# Patient Record
Sex: Female | Born: 1955
Health system: Southern US, Community
[De-identification: ages and names within clinical notes are randomized; demographics above are authoritative.]

## PROBLEM LIST (undated history)

## (undated) DIAGNOSIS — E785 Hyperlipidemia, unspecified: Secondary | ICD-10-CM

## (undated) DIAGNOSIS — G600 Hereditary motor and sensory neuropathy: Secondary | ICD-10-CM

## (undated) DIAGNOSIS — T7840XA Allergy, unspecified, initial encounter: Secondary | ICD-10-CM

## (undated) DIAGNOSIS — IMO0002 Reserved for concepts with insufficient information to code with codable children: Secondary | ICD-10-CM

## (undated) DIAGNOSIS — R51 Headache: Secondary | ICD-10-CM

## (undated) DIAGNOSIS — M1990 Unspecified osteoarthritis, unspecified site: Secondary | ICD-10-CM

## (undated) DIAGNOSIS — M75100 Unspecified rotator cuff tear or rupture of unspecified shoulder, not specified as traumatic: Secondary | ICD-10-CM

## (undated) DIAGNOSIS — M810 Age-related osteoporosis without current pathological fracture: Secondary | ICD-10-CM

## (undated) DIAGNOSIS — I499 Cardiac arrhythmia, unspecified: Secondary | ICD-10-CM

## (undated) DIAGNOSIS — R011 Cardiac murmur, unspecified: Secondary | ICD-10-CM

## (undated) DIAGNOSIS — F988 Other specified behavioral and emotional disorders with onset usually occurring in childhood and adolescence: Secondary | ICD-10-CM

## (undated) DIAGNOSIS — M797 Fibromyalgia: Secondary | ICD-10-CM

## (undated) DIAGNOSIS — E079 Disorder of thyroid, unspecified: Secondary | ICD-10-CM

## (undated) DIAGNOSIS — N390 Urinary tract infection, site not specified: Secondary | ICD-10-CM

## (undated) DIAGNOSIS — F329 Major depressive disorder, single episode, unspecified: Secondary | ICD-10-CM

## (undated) DIAGNOSIS — F32A Depression, unspecified: Secondary | ICD-10-CM

## (undated) HISTORY — DX: Major depressive disorder, single episode, unspecified: F32.9

## (undated) HISTORY — PX: JOINT REPLACEMENT: SHX530

## (undated) HISTORY — DX: Unspecified osteoarthritis, unspecified site: M19.90

## (undated) HISTORY — PX: WRIST SURGERY: SHX841

## (undated) HISTORY — DX: Disorder of thyroid, unspecified: E07.9

## (undated) HISTORY — DX: Allergy, unspecified, initial encounter: T78.40XA

## (undated) HISTORY — PX: FOOT SURGERY: SHX648

## (undated) HISTORY — DX: Headache: R51

## (undated) HISTORY — DX: Fibromyalgia: M79.7

## (undated) HISTORY — PX: HERNIA REPAIR: SHX51

## (undated) HISTORY — DX: Cardiac murmur, unspecified: R01.1

## (undated) HISTORY — DX: Reserved for concepts with insufficient information to code with codable children: IMO0002

## (undated) HISTORY — DX: Cardiac arrhythmia, unspecified: I49.9

## (undated) HISTORY — DX: Depression, unspecified: F32.A

## (undated) HISTORY — DX: Hereditary motor and sensory neuropathy: G60.0

## (undated) HISTORY — DX: Urinary tract infection, site not specified: N39.0

## (undated) HISTORY — DX: Other specified behavioral and emotional disorders with onset usually occurring in childhood and adolescence: F98.8

## (undated) HISTORY — DX: Hyperlipidemia, unspecified: E78.5

## (undated) HISTORY — PX: CATARACT EXTRACTION, BILATERAL: SHX1313

---

## 1980-10-04 HISTORY — PX: CARDIAC SURGERY: SHX584

## 2001-10-04 LAB — HM PAP SMEAR

## 2001-10-04 LAB — HM MAMMOGRAPHY

## 2005-10-04 HISTORY — PX: CARPAL TUNNEL RELEASE: SHX101

## 2011-11-23 ENCOUNTER — Ambulatory Visit: Payer: Self-pay | Admitting: Family Medicine

## 2011-11-23 ENCOUNTER — Telehealth: Payer: Self-pay | Admitting: *Deleted

## 2011-11-23 NOTE — Telephone Encounter (Signed)
No Show for new pt appt today, no number on chart information to call pt.

## 2012-01-31 ENCOUNTER — Encounter: Payer: Self-pay | Admitting: Family Medicine

## 2012-01-31 ENCOUNTER — Ambulatory Visit (INDEPENDENT_AMBULATORY_CARE_PROVIDER_SITE_OTHER): Payer: Federal, State, Local not specified - PPO | Admitting: Family Medicine

## 2012-01-31 VITALS — BP 140/78 | HR 72 | Temp 98.4°F | Resp 12 | Ht 65.0 in | Wt 174.0 lb

## 2012-01-31 DIAGNOSIS — Q211 Atrial septal defect, unspecified: Secondary | ICD-10-CM | POA: Insufficient documentation

## 2012-01-31 DIAGNOSIS — Q2111 Secundum atrial septal defect: Secondary | ICD-10-CM

## 2012-01-31 DIAGNOSIS — M1990 Unspecified osteoarthritis, unspecified site: Secondary | ICD-10-CM

## 2012-01-31 DIAGNOSIS — E039 Hypothyroidism, unspecified: Secondary | ICD-10-CM

## 2012-01-31 DIAGNOSIS — M810 Age-related osteoporosis without current pathological fracture: Secondary | ICD-10-CM | POA: Insufficient documentation

## 2012-01-31 DIAGNOSIS — F329 Major depressive disorder, single episode, unspecified: Secondary | ICD-10-CM

## 2012-01-31 DIAGNOSIS — F3289 Other specified depressive episodes: Secondary | ICD-10-CM

## 2012-01-31 DIAGNOSIS — IMO0001 Reserved for inherently not codable concepts without codable children: Secondary | ICD-10-CM

## 2012-01-31 DIAGNOSIS — J309 Allergic rhinitis, unspecified: Secondary | ICD-10-CM | POA: Insufficient documentation

## 2012-01-31 DIAGNOSIS — M797 Fibromyalgia: Secondary | ICD-10-CM

## 2012-01-31 DIAGNOSIS — G600 Hereditary motor and sensory neuropathy: Secondary | ICD-10-CM | POA: Insufficient documentation

## 2012-01-31 DIAGNOSIS — M899 Disorder of bone, unspecified: Secondary | ICD-10-CM

## 2012-01-31 DIAGNOSIS — F988 Other specified behavioral and emotional disorders with onset usually occurring in childhood and adolescence: Secondary | ICD-10-CM

## 2012-01-31 DIAGNOSIS — M8580 Other specified disorders of bone density and structure, unspecified site: Secondary | ICD-10-CM

## 2012-01-31 DIAGNOSIS — K219 Gastro-esophageal reflux disease without esophagitis: Secondary | ICD-10-CM | POA: Insufficient documentation

## 2012-01-31 DIAGNOSIS — F32A Depression, unspecified: Secondary | ICD-10-CM | POA: Insufficient documentation

## 2012-01-31 DIAGNOSIS — E785 Hyperlipidemia, unspecified: Secondary | ICD-10-CM

## 2012-01-31 MED ORDER — GABAPENTIN 800 MG PO TABS: 800.0000 mg | ORAL_TABLET | Freq: Four times a day (QID) | ORAL | Status: DC

## 2012-01-31 NOTE — Progress Notes (Signed)
Subjective:    Patient ID: Alexandra Norman, female    DOB: 03-May-1956, 56 y.o.   MRN: [id number]  HPI  New to establish care. Patient has a complicated past medical history. Her problems include osteoarthritis, depression, GERD, history of ASD repair, Charcot-Marie-Tooth disease, hyperlipidemia, hypothyroidism, attention deficit disorder, and osteopenia. Her multiple medications are reviewed. Compliant with all.  Maj. issue is chronic pain related to osteoarthritis and Charcot-Marie-Tooth disease. She takes tramadol and supplemental oxycodone. She is on high dose of gabapentin 800 mg 4 times a day and occasionally supplements with 400 mg dose which seems to work well. Previously tried statins for hyperlipidemia but this made her muscle pain much worse. Patient had ASD repair 1982. She's had a couple of orthopedic surgeries regarding foot problems related to Charcot-Marie-Tooth disease. Also a previous carpal tunnel surgery 2007.  History of osteopenia. Last DEXA about 6 years ago. Requesting repeat. Depression currently treated with Viibrid.  Still has depressive symptoms off and on. No suicidal ideation. GERD fairly well-controlled with Nexium. Still has occasional breakthrough symptoms. Hypothyroidism treated with levothyroxine 50 mcg daily. Last lab work estimated 6 months ago and reportedly normal.  Patient also relates questionable history of fibromyalgia. On multiple medications as above. Also requesting audiometry screen. Has difficulty sometimes hearing conversations but no problems with high-pitched noises. No history of recent hearing screen  Family history significant for both parents having osteoarthritis and alcohol history. Father had coronary disease. Mother and sister with history of depression.  The patient is divorced. Nonsmoker. No alcohol use. Works as a Engineer, water.  Past Medical History  Diagnosis Date  . Arthritis   . Depression   . Headache   . Ulcer   .  Allergy   . Arrhythmia   . Heart murmur   . Hyperlipidemia   . Thyroid disease   . UTI (urinary tract infection)    Past Surgical History  Procedure Date  . Cardiac surgery 1982  . Carpal tunnel release 2007    bilateral  . Foot surgery 2001 and 2005    bilateral    reports that she has never smoked. She does not have any smokeless tobacco history on file. Her alcohol and drug histories not on file. family history includes Alcohol abuse in her father and mother; Arthritis in her father and mother; Cancer in her sister; Heart disease in her father; and Mental illness in her mother and sister. Allergies  Allergen Reactions  . Penicillins     hives  . Statins       Review of Systems  Constitutional: Positive for fatigue. Negative for fever, chills, activity change, appetite change and unexpected weight change.  HENT: Negative for sore throat and trouble swallowing.   Eyes: Negative for visual disturbance.  Respiratory: Negative for cough, shortness of breath and wheezing.   Cardiovascular: Negative for chest pain, palpitations and leg swelling.  Gastrointestinal: Negative for abdominal pain.  Genitourinary: Negative for dysuria and hematuria.  Musculoskeletal: Positive for myalgias and arthralgias.  Skin: Negative for rash.  Neurological: Negative for dizziness, seizures, syncope, weakness and headaches.  Hematological: Negative for adenopathy.  Psychiatric/Behavioral: Positive for sleep disturbance and dysphoric mood. Negative for suicidal ideas.       Objective:   Physical Exam  Constitutional: She is oriented to person, place, and time. She appears well-developed and well-nourished. No distress.  HENT:  Right Ear: External ear normal.  Left Ear: External ear normal.  Mouth/Throat: Oropharynx is clear and moist.  Neck: Neck  supple. No thyromegaly present.  Cardiovascular: Normal rate and regular rhythm.  Exam reveals no gallop.   Pulmonary/Chest: Effort normal and  breath sounds normal. No respiratory distress. She has no wheezes. She has no rales.  Musculoskeletal: She exhibits no edema.       Patient has changes compatible with osteoarthritis with Bouchard's and Heberdens nodules on the hands.  She has interosseous muscle atrophy related to her Charcot-Marie-Tooth disease.  Lymphadenopathy:    She has no cervical adenopathy.  Neurological: She is alert and oriented to person, place, and time. No cranial nerve deficit.          Assessment & Plan:  #1 Charcot-Marie-Tooth disease. She has chronic pain related to this but overall stable. Ambulating without assistance without any difficulty. Refill gabapentin #2 GERD stable on Nexium #3 osteoarthritis involving hands. Continue tramadol and supplement with Percocet as needed. Pain medication contract signed. She does not need refills at this time #4 history of ASD repair #5 hyperlipidemia previously intolerant of statins #6 hypothyroidism. Recheck TSH at followup #7 osteopenia. No DEXA scan in 6 years. Schedule repeat #8 subjective decrease in hearing. Audiometry done. Patient has slight loss in the lower frequency range but better with higher frequency. No evidence for severe loss at this time

## 2012-02-03 ENCOUNTER — Encounter: Payer: Self-pay | Admitting: Family Medicine

## 2012-02-07 ENCOUNTER — Other Ambulatory Visit: Payer: Self-pay | Admitting: Family Medicine

## 2012-03-13 ENCOUNTER — Other Ambulatory Visit: Payer: Self-pay | Admitting: Family Medicine

## 2012-03-24 ENCOUNTER — Telehealth: Payer: Self-pay | Admitting: Family Medicine

## 2012-03-24 NOTE — Telephone Encounter (Signed)
Pt called req new scripts for amphetamine-dextroamphetamine (ADDERALL) 15 MG and oxyCODONE-acetaminophen (PERCOCET) 5-325 MG per tablet

## 2012-03-24 NOTE — Telephone Encounter (Signed)
Pt was new to establish on 01-30-12.  We have not filled the Adderall or Percocet yet.  Pt is scheduled for 2 month follow-up on 7/3.

## 2012-03-27 MED ORDER — OXYCODONE-ACETAMINOPHEN 5-325 MG PO TABS: ORAL_TABLET | ORAL | Status: DC

## 2012-03-27 MED ORDER — AMPHETAMINE-DEXTROAMPHETAMINE 15 MG PO TABS: 15.0000 mg | ORAL_TABLET | Freq: Two times a day (BID) | ORAL | Status: DC

## 2012-03-27 NOTE — Telephone Encounter (Signed)
Refill each once until follow up.

## 2012-03-27 NOTE — Telephone Encounter (Signed)
Pt informed on VM Rx ready to pick-up 

## 2012-04-05 ENCOUNTER — Ambulatory Visit (INDEPENDENT_AMBULATORY_CARE_PROVIDER_SITE_OTHER)
Admission: RE | Admit: 2012-04-05 | Discharge: 2012-04-05 | Disposition: A | Discharge status: Home / Self Care | Payer: Federal, State, Local not specified - PPO | Source: Ambulatory Visit | Attending: Family Medicine | Admitting: Family Medicine

## 2012-04-05 ENCOUNTER — Ambulatory Visit (INDEPENDENT_AMBULATORY_CARE_PROVIDER_SITE_OTHER): Payer: Federal, State, Local not specified - PPO | Admitting: Family Medicine

## 2012-04-05 ENCOUNTER — Encounter: Payer: Self-pay | Admitting: Family Medicine

## 2012-04-05 VITALS — BP 138/76 | Temp 98.0°F | Wt 173.0 lb

## 2012-04-05 DIAGNOSIS — E039 Hypothyroidism, unspecified: Secondary | ICD-10-CM

## 2012-04-05 DIAGNOSIS — F988 Other specified behavioral and emotional disorders with onset usually occurring in childhood and adolescence: Secondary | ICD-10-CM

## 2012-04-05 DIAGNOSIS — M899 Disorder of bone, unspecified: Secondary | ICD-10-CM

## 2012-04-05 DIAGNOSIS — Z79899 Other long term (current) drug therapy: Secondary | ICD-10-CM

## 2012-04-05 DIAGNOSIS — G600 Hereditary motor and sensory neuropathy: Secondary | ICD-10-CM

## 2012-04-05 DIAGNOSIS — M8580 Other specified disorders of bone density and structure, unspecified site: Secondary | ICD-10-CM

## 2012-04-05 LAB — HEPATIC FUNCTION PANEL
ALT: 18 U/L (ref 0–35)
AST: 21 U/L (ref 0–37)
Albumin: 3.8 g/dL (ref 3.5–5.2)
Alkaline Phosphatase: 90 U/L (ref 39–117)
Bilirubin, Direct: 0 mg/dL (ref 0.0–0.3)
Total Bilirubin: 0.2 mg/dL — ABNORMAL LOW (ref 0.3–1.2)
Total Protein: 6.2 g/dL (ref 6.0–8.3)

## 2012-04-05 LAB — TSH: TSH: 2.73 u[IU]/mL (ref 0.35–5.50)

## 2012-04-05 MED ORDER — BACLOFEN 10 MG PO TABS: 10.0000 mg | ORAL_TABLET | Freq: Three times a day (TID) | ORAL | Status: DC

## 2012-04-05 NOTE — Progress Notes (Signed)
  Subjective:    Patient ID: Alexandra Norman, female    DOB: Jul 26, 1956, 56 y.o.   MRN: [id number]  HPI  Medical followup. Patient history osteopenia, ADD, allergic rhinitis, history of atrial septal defect, Charcot-Marie-Tooth disease, depression, fibromyalgia, GERD, and hypothyroidism. She had recent DEXA scan and we are waiting on results.  Elevated blood pressures around 140/90 by recent readings. No home blood pressure cuff. No headaches. No dizziness. Never treated for hypertension. Currently not exercising. Weight stable.  History of depression. Recently ran out of her Viibrid and she's actually felt much better the past week. She feels her thinking is clearer. She has had some slight increase in neuropathic pain in her lower extremities. She would like to try to remain off.  Hypothyroidism. Last TSH almost one year ago. Takes levothyroxin. Requesting lab work. Requesting refills baclofen for her muscle spasms. Chronic pain is fairly well-controlled with oxycodone.  ADD history. Difficulties in prior authorization and we have not seen any forms yet.  Past Medical History  Diagnosis Date  . Arthritis   . Depression   . Headache   . Ulcer   . Allergy   . Arrhythmia   . Heart murmur   . Hyperlipidemia   . Thyroid disease   . UTI (urinary tract infection)    Past Surgical History  Procedure Date  . Cardiac surgery 1982    ASD repair   . Carpal tunnel release 2007    bilateral  . Foot surgery 2001 and 2005    bilateral    reports that she has never smoked. She does not have any smokeless tobacco history on file. Her alcohol and drug histories not on file. family history includes Alcohol abuse in her father and mother; Arthritis in her father and mother; Cancer in her sister; Heart disease in her father; and Mental illness in her mother and sister. Allergies  Allergen Reactions  . Penicillins     hives  . Statins       Review of Systems  Constitutional: Positive for  fatigue. Negative for fever and chills.  Respiratory: Negative for cough and shortness of breath.   Cardiovascular: Negative for chest pain.  Gastrointestinal: Negative for abdominal pain.  Neurological: Negative for dizziness and headaches.  Hematological: Negative for adenopathy.       Objective:   Physical Exam  Constitutional: She is oriented to person, place, and time. She appears well-developed and well-nourished. No distress.  HENT:  Right Ear: External ear normal.  Left Ear: External ear normal.  Mouth/Throat: Oropharynx is clear and moist.  Neck: Neck supple. No thyromegaly present.  Cardiovascular: Normal rate and regular rhythm.   Pulmonary/Chest: Effort normal and breath sounds normal. No respiratory distress. She has no wheezes. She has no rales.  Musculoskeletal: She exhibits no edema.  Neurological: She is alert and oriented to person, place, and time.          Assessment & Plan:  #1 elevated blood pressure. Recommend exercise and weight control and close monitoring. Consider medical therapy if not improving over the next few months #2 history of depression. Currently stable off antidepressants. Touch base immediately if she has increased depressive symptoms #3 hypothyroidism. Recheck TSH  #4 history of chronic muscle spasms. Refill baclofen . #5 history of ADD. Check on prior authorization for Adderall which she has taken for several years

## 2012-04-05 NOTE — Patient Instructions (Signed)

## 2012-04-06 ENCOUNTER — Encounter: Payer: Self-pay | Admitting: Family Medicine

## 2012-04-07 NOTE — Progress Notes (Signed)
Quick Note:  Pt informed on VM ______ 

## 2012-05-02 ENCOUNTER — Telehealth: Payer: Self-pay | Admitting: Family Medicine

## 2012-05-02 NOTE — Telephone Encounter (Signed)
Pt called req refills for the oxyCODONE-acetaminophen (PERCOCET) 5-325 MG per tablet and amphetamine-dextroamphetamine (ADDERALL) 15 MG tablet

## 2012-05-03 ENCOUNTER — Other Ambulatory Visit: Payer: Self-pay | Admitting: Family Medicine

## 2012-05-03 MED ORDER — AMPHETAMINE-DEXTROAMPHETAMINE 15 MG PO TABS: 15.0000 mg | ORAL_TABLET | Freq: Two times a day (BID) | ORAL | Status: DC

## 2012-05-03 MED ORDER — OXYCODONE-ACETAMINOPHEN 5-325 MG PO TABS: ORAL_TABLET | ORAL | Status: DC

## 2012-05-03 NOTE — Telephone Encounter (Signed)
Pt informed on VM Rx ready to pick-up 

## 2012-05-03 NOTE — Telephone Encounter (Signed)
Refills OK. 

## 2012-05-03 NOTE — Telephone Encounter (Signed)
adderall #60 with 0 refills on 03-27-12 Percocet #45 with 0 refills on 03-27-12  Pt had OV 04/06/12

## 2012-05-31 ENCOUNTER — Telehealth: Payer: Self-pay | Admitting: Family Medicine

## 2012-05-31 NOTE — Telephone Encounter (Signed)
Pt requesting refills on amphetamine-dextroamphetamine (ADDERALL) 15 MG tablet and oxyCODONE-acetaminophen (PERCOCET/ROXICET) 5-325 MG  Pt also requesting results of bone density scan

## 2012-05-31 NOTE — Telephone Encounter (Signed)
Adderall #60 (BID) and Oxycodone #45 (1 and 1/2 prn)last filled 05/03/12

## 2012-06-01 MED ORDER — OXYCODONE-ACETAMINOPHEN 5-325 MG PO TABS: ORAL_TABLET | ORAL | Status: DC

## 2012-06-01 MED ORDER — ALENDRONATE SODIUM 70 MG PO TABS: 70.0000 mg | ORAL_TABLET | ORAL | Status: DC

## 2012-06-01 MED ORDER — AMPHETAMINE-DEXTROAMPHETAMINE 15 MG PO TABS: 15.0000 mg | ORAL_TABLET | Freq: Two times a day (BID) | ORAL | Status: DC

## 2012-06-01 MED ORDER — AMPHETAMINE-DEXTROAMPHETAMINE 15 MG PO TABS: 15.0000 mg | ORAL_TABLET | Freq: Every day | ORAL | Status: DC

## 2012-06-01 NOTE — Telephone Encounter (Signed)
May refill requested medications. Let's provide 3 month refills for Adderall. Her DEXA scan revealed osteoporosis lumbar and hip. Patient does have history of reflux. If no prior intolerance to bisphosphonates, start Fosamax 70 mg weekly-refill for one year. Needs to take this upright position with full glass of water on empty stomach. Stop if she has any increasing reflux symptoms

## 2012-06-01 NOTE — Telephone Encounter (Signed)
Pt informed on VM of meds will be ready to pick-up.  Requested she call back so I can explain Dexa and new med

## 2012-06-01 NOTE — Telephone Encounter (Signed)
Fosamax has been sent to pt pharmacy, controlled meds ready to pick-up and a note included that dexa showed osteoprosis and she needs to begin taking Fosamax

## 2012-06-27 ENCOUNTER — Other Ambulatory Visit: Payer: Self-pay | Admitting: *Deleted

## 2012-06-27 MED ORDER — TEMAZEPAM 15 MG PO CAPS: 15.0000 mg | ORAL_CAPSULE | Freq: Every evening | ORAL | Status: DC | PRN

## 2012-06-27 NOTE — Telephone Encounter (Signed)
Called in.

## 2012-06-27 NOTE — Telephone Encounter (Signed)
Faxed refill request Temazepam 15 mg, one at [facility] PR[facility].  This med is only historical in pt chart at this time.

## 2012-06-27 NOTE — Telephone Encounter (Signed)
Refill OK for 3 months. 

## 2012-07-05 ENCOUNTER — Other Ambulatory Visit: Payer: Self-pay | Admitting: Family Medicine

## 2012-07-05 MED ORDER — AMPHETAMINE-DEXTROAMPHETAMINE 15 MG PO TABS: 15.0000 mg | ORAL_TABLET | Freq: Two times a day (BID) | ORAL | Status: DC

## 2012-07-05 NOTE — Telephone Encounter (Addendum)
Pt needs new rx percocet. Pt stated last 2 rxs (adderall) was incorrect. Pt stated she take two adderalls a day instead on 1 adderall a day.  Pt will bring old rxs back

## 2012-07-05 NOTE — Telephone Encounter (Signed)
2nd and 3rd Adderall sig: was take one tab daily, disp #60, should have been BID.  These were corrected and re-printed.  Percocet sig: 1 and 1/2 daily, #45 last filled 06-01-12

## 2012-07-06 MED ORDER — OXYCODONE-ACETAMINOPHEN 5-325 MG PO TABS: ORAL_TABLET | ORAL | Status: DC

## 2012-07-06 NOTE — Telephone Encounter (Signed)
Pt informed all 3 RX are ready to pick-up on VM

## 2012-07-06 NOTE — Telephone Encounter (Signed)
Refill Percocet OK.

## 2012-07-12 ENCOUNTER — Encounter: Payer: Self-pay | Admitting: Family Medicine

## 2012-07-12 ENCOUNTER — Ambulatory Visit (INDEPENDENT_AMBULATORY_CARE_PROVIDER_SITE_OTHER): Payer: Federal, State, Local not specified - PPO | Admitting: Family Medicine

## 2012-07-12 VITALS — BP 120/72 | Temp 98.1°F | Wt 164.0 lb

## 2012-07-12 DIAGNOSIS — G600 Hereditary motor and sensory neuropathy: Secondary | ICD-10-CM

## 2012-07-12 DIAGNOSIS — M810 Age-related osteoporosis without current pathological fracture: Secondary | ICD-10-CM

## 2012-07-12 DIAGNOSIS — K219 Gastro-esophageal reflux disease without esophagitis: Secondary | ICD-10-CM

## 2012-07-12 DIAGNOSIS — Z23 Encounter for immunization: Secondary | ICD-10-CM

## 2012-07-12 DIAGNOSIS — E039 Hypothyroidism, unspecified: Secondary | ICD-10-CM

## 2012-07-12 DIAGNOSIS — E785 Hyperlipidemia, unspecified: Secondary | ICD-10-CM

## 2012-07-12 MED ORDER — FLUTICASONE PROPIONATE 50 MCG/ACT NA SUSP: 2.0000 | Freq: Every day | NASAL | Status: DC

## 2012-07-12 MED ORDER — TRAMADOL HCL 50 MG PO TABS: ORAL_TABLET | ORAL | Status: DC

## 2012-07-12 MED ORDER — AMITRIPTYLINE HCL 10 MG PO TABS: 10.0000 mg | ORAL_TABLET | Freq: Every day | ORAL | Status: DC

## 2012-07-12 MED ORDER — ESOMEPRAZOLE MAGNESIUM 40 MG PO CPDR: 40.0000 mg | DELAYED_RELEASE_CAPSULE | Freq: Every day | ORAL | Status: DC

## 2012-07-12 NOTE — Progress Notes (Signed)
  Subjective:    Patient ID: Alexandra Norman, female    DOB: 01-19-1956, 56 y.o.   MRN: [id number]  HPI  Patient has chronic pain syndrome. Charcot-Marie-Tooth disease. She has severe neuropathic pain mostly lower extremities -especially right foot. Pain fluctuates between 4-9/10. She is on very high dose gabapentin 3200 mg daily. Still has significant pains day and night. She supplements oxycodone and trys to take infrequently. She also has fibromyalgia history. She was previously on antidepressant and felt this may have helped her pain somewhat. She does not recall tricyclic such as elavil or nortriptyline.  She has osteoarthritis especially involving the hands. She has significant arthritis in her back and tramadol usually helps that pain. History of ADD and remains on Adderall which she's been on for quite some time. No hypertension history. Hypothyroidism with recent TSH normal. Patient needs flu vaccine.  Regarding her Charcot-Marie-Tooth she is interested in trying to find local specialist with expertise in this  Past Medical History  Diagnosis Date  . Arthritis   . Depression   . Headache   . Ulcer   . Allergy   . Arrhythmia   . Heart murmur   . Hyperlipidemia   . Thyroid disease   . UTI (urinary tract infection)   . ADD (attention deficit disorder)    Past Surgical History  Procedure Date  . Cardiac surgery 1982    ASD repair   . Carpal tunnel release 2007    bilateral  . Foot surgery 2001 and 2005    bilateral    reports that she has never smoked. She does not have any smokeless tobacco history on file. Her alcohol and drug histories not on file. family history includes Alcohol abuse in her father and mother; Arthritis in her father and mother; Cancer in her sister; Heart disease in her father; and Mental illness in her mother and sister. Allergies  Allergen Reactions  . Penicillins     hives  . Statins      Review of Systems  Constitutional: Negative for fever,  chills, appetite change and unexpected weight change.  Respiratory: Negative for cough and shortness of breath.   Cardiovascular: Negative for chest pain.  Gastrointestinal: Negative for nausea and abdominal pain.  Musculoskeletal: Positive for back pain and arthralgias.  Skin: Negative for rash.       Objective:   Physical Exam  Constitutional: She appears well-developed and well-nourished.  Neck: Neck supple. No thyromegaly present.  Cardiovascular: Normal rate and regular rhythm.   Pulmonary/Chest: Effort normal and breath sounds normal. No respiratory distress. She has no wheezes. She has no rales.  Musculoskeletal: She exhibits no edema.  Lymphadenopathy:    She has no cervical adenopathy.          Assessment & Plan:  #1 Charcot-Marie-Tooth disease. Chronic neuropathic pain. Consider trial Elavil 10 mg each bedtime and titrate after one week to 20 mg. Continue gabapentin. We discussed other possible alternative such as Cymbalta but would not mix with tramadol because of increased serotonin risk #2 history of osteoporosis. Patient intolerant of Fosamax. We'll explore other options. She is taking calcium and vitamin D  #3 GERD stable on Nexium  #4 history of ADD  #5 history of hypothyroidism. Recent TSH normal  #6 health maintenance. Flu vaccine recommended and given

## 2012-07-12 NOTE — Patient Instructions (Signed)
Take amitriptyline one at night and after one week if no further pain improvement then titrate to two at night. Podiatry for orthotics- [facility].

## 2012-08-04 ENCOUNTER — Other Ambulatory Visit: Payer: Self-pay | Admitting: Family Medicine

## 2012-08-04 DIAGNOSIS — G600 Hereditary motor and sensory neuropathy: Secondary | ICD-10-CM

## 2012-08-04 NOTE — Telephone Encounter (Signed)
Pt request refill on her percocet. [facility] [address]

## 2012-08-04 NOTE — Telephone Encounter (Signed)
Let patient know I am setting this up

## 2012-08-04 NOTE — Telephone Encounter (Signed)
Pt informed on VM

## 2012-08-04 NOTE — Telephone Encounter (Signed)
Pt would like MD to proceed w/ making her referral to another MD that deals with her CMT disease. Dr Caryl Never  had called her previously asked her this.

## 2012-08-09 ENCOUNTER — Other Ambulatory Visit: Payer: Self-pay | Admitting: Family Medicine

## 2012-08-09 MED ORDER — OXYCODONE-ACETAMINOPHEN 5-325 MG PO TABS: ORAL_TABLET | ORAL | Status: DC

## 2012-08-09 NOTE — Telephone Encounter (Signed)
Pt informed Rx ready to pick up

## 2012-08-09 NOTE — Telephone Encounter (Signed)
Percocet last filled 07-06-12, #45 with 0 refills (1 and 1/2 daily)

## 2012-08-09 NOTE — Telephone Encounter (Signed)
Pt needs new rx percocet °

## 2012-08-09 NOTE — Telephone Encounter (Signed)
Refill OK

## 2012-09-04 ENCOUNTER — Telehealth: Payer: Self-pay | Admitting: Family Medicine

## 2012-09-04 NOTE — Telephone Encounter (Signed)
Refill Adderall for 3 months.  Refill percocet for one month

## 2012-09-04 NOTE — Telephone Encounter (Signed)
Percocet, 1 and 1/2 tab daily last filled 08-08-12, #30 with 0 refills Adderall last filled 07-05-12, #60 with only 1 refill

## 2012-09-04 NOTE — Telephone Encounter (Signed)
Pt req refills of oxyCODONE-acetaminophen (PERCOCET/ROXICET) 5-325 MG per tablet and  amphetamine-dextroamphetamine (ADDERALL) 15 MG tablet

## 2012-09-05 MED ORDER — AMPHETAMINE-DEXTROAMPHETAMINE 15 MG PO TABS: 15.0000 mg | ORAL_TABLET | Freq: Two times a day (BID) | ORAL | Status: DC

## 2012-09-05 MED ORDER — OXYCODONE-ACETAMINOPHEN 5-325 MG PO TABS: ORAL_TABLET | ORAL | Status: DC

## 2012-09-05 NOTE — Telephone Encounter (Signed)
Pt informed Rx ready to pick-up on VM 

## 2012-09-07 ENCOUNTER — Ambulatory Visit (INDEPENDENT_AMBULATORY_CARE_PROVIDER_SITE_OTHER): Payer: Federal, State, Local not specified - PPO | Admitting: Family Medicine

## 2012-09-07 ENCOUNTER — Encounter: Payer: Self-pay | Admitting: Family Medicine

## 2012-09-07 VITALS — BP 130/70 | Temp 98.1°F | Wt 159.0 lb

## 2012-09-07 DIAGNOSIS — G600 Hereditary motor and sensory neuropathy: Secondary | ICD-10-CM

## 2012-09-07 DIAGNOSIS — M1990 Unspecified osteoarthritis, unspecified site: Secondary | ICD-10-CM

## 2012-09-07 DIAGNOSIS — G629 Polyneuropathy, unspecified: Secondary | ICD-10-CM

## 2012-09-07 DIAGNOSIS — R21 Rash and other nonspecific skin eruption: Secondary | ICD-10-CM

## 2012-09-07 DIAGNOSIS — G609 Hereditary and idiopathic neuropathy, unspecified: Secondary | ICD-10-CM

## 2012-09-07 MED ORDER — TRIAMCINOLONE ACETONIDE 0.1 % EX CREA: TOPICAL_CREAM | Freq: Two times a day (BID) | CUTANEOUS | Status: DC

## 2012-09-07 MED ORDER — DICLOFENAC SODIUM 1 % TD GEL: TRANSDERMAL | Status: DC

## 2012-09-07 MED ORDER — AMITRIPTYLINE HCL 10 MG PO TABS: 20.0000 mg | ORAL_TABLET | Freq: Every day | ORAL | Status: DC

## 2012-09-07 MED ORDER — PREDNISONE 10 MG PO TABS: ORAL_TABLET | ORAL | Status: DC

## 2012-09-07 NOTE — Progress Notes (Signed)
Subjective:     Patient ID: Alexandra Norman, female   DOB: December 31, 1955, 56 y.o.   MRN: [id number]  HPI Patient here for evaluation of rash on R foot since June.  Patient has history of Charcot-Marie-Tooth and fibromyalgia.  Patient describes rash as pruritic, and after being on her feet for several hours at work, it begins to burn.  Has tried many OTC creams and Benadryl without much relief.  Topical antifungals have not relieved.  States that cortisone 10 cream relieves itching briefly, but does not improve rash.  Epsom salts with peppermint oil provide relief for burning.  She cannot think of any changes to clothing, or contacts to her right foot only that would have caused this.  Has tried changing laundry detergent without effect.  Review of Systems  Skin: Positive for rash (per HPI).       Objective:   Physical Exam  Constitutional: She appears well-developed and well-nourished. No distress.  Skin: Rash (non-scaly slightly erythematous papular rash over dorsum of R foot) noted. There is erythema.       Assessment:     56 year old here for evaluation of rash on R foot.    Plan:     1. Rash: appears somewhat follicular.  Less likely fungal, as antifungals have provided no relief.  Not classic contact dermatitis because of pattern.  Rx prednisone taper 10mg  4-4-4-3-3-2-1, along with triamcinolone 0.1% cream.  Follow up if symptoms persist or worsen. 2. Osteoarthritis: patient states preference for trying Voltaren gel for pain.  Lower risk of ulcer vs oral NSAIDs, but not negligible.  Will give patient trial of Voltaren gel 1% to be applied up to 4 times daily to arthritic joints.  Marthann Schiller, MS3     Agree with assessment and plan as per Marthann Schiller, MS 3 Evelena Peat MD

## 2012-10-02 ENCOUNTER — Other Ambulatory Visit: Payer: Self-pay | Admitting: Family Medicine

## 2012-10-02 NOTE — Telephone Encounter (Signed)
Pt needs new rx percocet 5-325mg 

## 2012-10-02 NOTE — Telephone Encounter (Signed)
Refill for one month 

## 2012-10-05 MED ORDER — OXYCODONE-ACETAMINOPHEN 5-325 MG PO TABS: ORAL_TABLET | ORAL | Status: DC

## 2012-10-05 NOTE — Telephone Encounter (Signed)
Pt informed on VM Rx ready to pick-up 

## 2012-10-31 ENCOUNTER — Other Ambulatory Visit: Payer: Self-pay | Admitting: Family Medicine

## 2012-10-31 NOTE — Telephone Encounter (Signed)
Pt needs new rx percocet °

## 2012-10-31 NOTE — Telephone Encounter (Signed)
Oxycodone 1 and 1/2 daily prn last filled 10-05-12, #45 with 0 refills

## 2012-11-02 NOTE — Telephone Encounter (Signed)
Refill OK

## 2012-11-03 MED ORDER — OXYCODONE-ACETAMINOPHEN 5-325 MG PO TABS: ORAL_TABLET | ORAL | Status: DC

## 2012-11-03 NOTE — Telephone Encounter (Signed)
Pt informed Rx ready for pick-up on VM

## 2012-11-03 NOTE — Addendum Note (Signed)
Addended by: Melchor Amour on: 11/03/2012 08:54 AM   Modules accepted: Orders

## 2012-11-30 ENCOUNTER — Telehealth: Payer: Self-pay | Admitting: Family Medicine

## 2012-11-30 MED ORDER — ESOMEPRAZOLE MAGNESIUM 40 MG PO CPDR: 40.0000 mg | DELAYED_RELEASE_CAPSULE | Freq: Every day | ORAL | Status: DC

## 2012-11-30 NOTE — Telephone Encounter (Signed)
Pt needs refill of esomeprazole (NEXIUM) 40 MG capsule.  [facility] Dr [facility]  [facility]

## 2012-12-07 ENCOUNTER — Telehealth: Payer: Self-pay | Admitting: Family Medicine

## 2012-12-07 MED ORDER — OXYCODONE-ACETAMINOPHEN 5-325 MG PO TABS: ORAL_TABLET | ORAL | Status: DC

## 2012-12-07 NOTE — Telephone Encounter (Signed)
Pt needs new rx percocet °

## 2012-12-07 NOTE — Telephone Encounter (Signed)
Refill OK

## 2012-12-07 NOTE — Telephone Encounter (Signed)
Pt informed Rx ready to pick up

## 2012-12-07 NOTE — Telephone Encounter (Signed)
Percocet 1 and 1/2 daily prn last filled 10-16-12, #45 with 0 refills

## 2013-01-08 ENCOUNTER — Telehealth: Payer: Self-pay | Admitting: Family Medicine

## 2013-01-08 NOTE — Telephone Encounter (Signed)
Pt needs new rx percocet °

## 2013-01-08 NOTE — Telephone Encounter (Signed)
Percocet 1 and 1/2 daily last filled 12-07-12, #45 with 0 refills

## 2013-01-08 NOTE — Telephone Encounter (Signed)
Refill OK

## 2013-01-09 MED ORDER — OXYCODONE-ACETAMINOPHEN 5-325 MG PO TABS: ORAL_TABLET | ORAL | Status: DC

## 2013-01-09 NOTE — Telephone Encounter (Signed)
Pt informed Rx will be ready tomorrow on VM

## 2013-01-22 ENCOUNTER — Other Ambulatory Visit: Payer: Self-pay | Admitting: Family Medicine

## 2013-01-22 NOTE — Telephone Encounter (Signed)
Refill for 3 months. 

## 2013-01-22 NOTE — Telephone Encounter (Signed)
Temazepam at [facility] last filled 06-27-12, #30 with 2 refills

## 2013-01-30 ENCOUNTER — Ambulatory Visit: Payer: Federal, State, Local not specified - PPO | Admitting: Family Medicine

## 2013-02-05 ENCOUNTER — Ambulatory Visit (INDEPENDENT_AMBULATORY_CARE_PROVIDER_SITE_OTHER): Payer: Federal, State, Local not specified - PPO | Admitting: Family Medicine

## 2013-02-05 ENCOUNTER — Encounter: Payer: Self-pay | Admitting: Family Medicine

## 2013-02-05 VITALS — BP 130/72 | Temp 97.8°F | Wt 161.0 lb

## 2013-02-05 DIAGNOSIS — F3289 Other specified depressive episodes: Secondary | ICD-10-CM

## 2013-02-05 DIAGNOSIS — M797 Fibromyalgia: Secondary | ICD-10-CM

## 2013-02-05 DIAGNOSIS — F32A Depression, unspecified: Secondary | ICD-10-CM

## 2013-02-05 DIAGNOSIS — IMO0001 Reserved for inherently not codable concepts without codable children: Secondary | ICD-10-CM

## 2013-02-05 DIAGNOSIS — G600 Hereditary motor and sensory neuropathy: Secondary | ICD-10-CM

## 2013-02-05 DIAGNOSIS — F329 Major depressive disorder, single episode, unspecified: Secondary | ICD-10-CM

## 2013-02-05 DIAGNOSIS — M1990 Unspecified osteoarthritis, unspecified site: Secondary | ICD-10-CM

## 2013-02-05 DIAGNOSIS — E039 Hypothyroidism, unspecified: Secondary | ICD-10-CM

## 2013-02-05 MED ORDER — AMPHETAMINE-DEXTROAMPHETAMINE 15 MG PO TABS: 15.0000 mg | ORAL_TABLET | Freq: Two times a day (BID) | ORAL | Status: DC

## 2013-02-05 MED ORDER — OXYCODONE-ACETAMINOPHEN 5-325 MG PO TABS: ORAL_TABLET | ORAL | Status: DC

## 2013-02-05 MED ORDER — CITALOPRAM HYDROBROMIDE 20 MG PO TABS: 20.0000 mg | ORAL_TABLET | Freq: Every day | ORAL | Status: DC

## 2013-02-05 NOTE — Progress Notes (Signed)
  Subjective:    Patient ID: Alexandra Norman, female    DOB: 1956/05/27, 57 y.o.   MRN: [id number]  HPI Patient here for several issues as follows  Long history of recurrent depression. Mother committed suicide 50. Her sister committed suicide 52. Patient previously has taken Celexa which has helped She's had several weeks of off and on crying and anhedonia. No suicidal ideation. She works third shift so does not see a lot of sunlight. She thinks this is a contributing factor Hypothyroidism and compliant with treatment. Due for repeat TSH soon  History of Charcot-Marie-Tooth disease. She has chronic neuropathic pain and also pain related to fibromyalgia and osteoarthritis. Requesting refills of Percocet. She takes a regimen of 1 daily and one half at night. She also takes gabapentin for chronic neuropathic pain. She is requesting FMLA paperwork be completed.  Sometimes misses one to 3 days per month related to her flareups of Charcot-Marie-Tooth neuropathy  We have tried some amitriptyline but she had itching and also intolerable anticholinergic side effects.  Past Medical History  Diagnosis Date  . Arthritis   . Depression   . Headache   . Ulcer   . Allergy   . Arrhythmia   . Heart murmur   . Hyperlipidemia   . Thyroid disease   . UTI (urinary tract infection)   . ADD (attention deficit disorder)    Past Surgical History  Procedure Laterality Date  . Cardiac surgery  1982    ASD repair   . Carpal tunnel release  2007    bilateral  . Foot surgery  2001 and 2005    bilateral    reports that she has never smoked. She does not have any smokeless tobacco history on file. Her alcohol and drug histories are not on file. family history includes Alcohol abuse in her father and mother; Arthritis in her father and mother; Cancer in her sister; Heart disease in her father; and Mental illness in her mother and sister. Allergies  Allergen Reactions  . Penicillins     hives  .  Statins       Review of Systems  Constitutional: Positive for fatigue. Negative for appetite change and unexpected weight change.  HENT: Negative for trouble swallowing.   Respiratory: Negative for cough and shortness of breath.   Cardiovascular: Negative for chest pain and leg swelling.  Gastrointestinal: Negative for nausea and vomiting.  Musculoskeletal: Positive for arthralgias.  Neurological: Negative for dizziness.  Psychiatric/Behavioral: Positive for dysphoric mood.       Objective:   Physical Exam  Constitutional: She is oriented to person, place, and time. She appears well-developed and well-nourished.  Cardiovascular: Normal rate and regular rhythm.   Pulmonary/Chest: Effort normal and breath sounds normal. No respiratory distress. She has no wheezes. She has no rales.  Musculoskeletal: She exhibits no edema.  Neurological: She is alert and oriented to person, place, and time. No cranial nerve deficit.  Psychiatric: Her behavior is normal. Judgment and thought content normal.  Tearful off and on during interview but appropriate in interactions          Assessment & Plan:  #1 recurrent depression. She has done well with Celexa in the past. Start back 20 mg once daily. Reassess in 3-4 weeks #2 hypothyroidism. Recheck TSH #3 history of Charcot-Marie-Tooth disease. Complete paperwork as above. We refill her oxycodone #4 attention deficit disorder. Refill Adderall

## 2013-02-06 ENCOUNTER — Encounter: Payer: Self-pay | Admitting: Family Medicine

## 2013-02-06 LAB — TSH: TSH: 0.52 u[IU]/mL (ref 0.35–5.50)

## 2013-02-07 NOTE — Progress Notes (Signed)
Quick Note:  Pt informed on VM ______ 

## 2013-03-06 ENCOUNTER — Ambulatory Visit (INDEPENDENT_AMBULATORY_CARE_PROVIDER_SITE_OTHER): Payer: Federal, State, Local not specified - PPO | Admitting: Family Medicine

## 2013-03-06 ENCOUNTER — Encounter: Payer: Self-pay | Admitting: Family Medicine

## 2013-03-06 VITALS — BP 132/60 | HR 69 | Temp 98.6°F | Resp 20 | Wt 162.0 lb

## 2013-03-06 DIAGNOSIS — F32A Depression, unspecified: Secondary | ICD-10-CM

## 2013-03-06 DIAGNOSIS — IMO0001 Reserved for inherently not codable concepts without codable children: Secondary | ICD-10-CM

## 2013-03-06 DIAGNOSIS — F3289 Other specified depressive episodes: Secondary | ICD-10-CM

## 2013-03-06 DIAGNOSIS — M797 Fibromyalgia: Secondary | ICD-10-CM

## 2013-03-06 DIAGNOSIS — F329 Major depressive disorder, single episode, unspecified: Secondary | ICD-10-CM

## 2013-03-06 MED ORDER — GABAPENTIN 800 MG PO TABS: 800.0000 mg | ORAL_TABLET | Freq: Four times a day (QID) | ORAL | Status: DC

## 2013-03-06 MED ORDER — BACLOFEN 10 MG PO TABS: 10.0000 mg | ORAL_TABLET | Freq: Three times a day (TID) | ORAL | Status: DC

## 2013-03-06 NOTE — Progress Notes (Signed)
  Subjective:    Patient ID: Alexandra Norman, female    DOB: Dec 11, 1955, 57 y.o.   MRN: [id number]  HPI Followup depression. Patient previously been on citalopram was started as a month ago. She's taking 20 mg daily and is having great improvement in terms of her mood. Increased initiative. Fewer crying spells. Tolerating well with no side effects. Sleep is also improved. Overall she is quite pleased. Recent thyroid function normal  Patient also feels that she is coping with her chronic pain issues better since starting antidepressant and improving her sleep. She has Charcot-Marie-Tooth disease and chronic neuropathic pain Also recently had some left lower lumbar pains radiating toward left buttock. No progressive lower extremity numbness or weakness. She's had massage therapy was does not help. She would like to consider chiropractic care.  Past Medical History  Diagnosis Date  . Arthritis   . Depression   . Headache(784.0)   . Ulcer   . Allergy   . Arrhythmia   . Heart murmur   . Hyperlipidemia   . Thyroid disease   . UTI (urinary tract infection)   . ADD (attention deficit disorder)   . Charcot-Marie-Tooth disease   . Fibromyalgia   . Osteoarthritis    Past Surgical History  Procedure Laterality Date  . Cardiac surgery  1982    ASD repair   . Carpal tunnel release  2007    bilateral  . Foot surgery  2001 and 2005    bilateral    reports that she has never smoked. She does not have any smokeless tobacco history on file. Her alcohol and drug histories are not on file. family history includes Alcohol abuse in her father and mother; Arthritis in her father and mother; Cancer in her sister; Heart disease in her father; and Mental illness in her mother and sister. Allergies  Allergen Reactions  . Penicillins     hives  . Statins       Review of Systems  Constitutional: Negative for appetite change and unexpected weight change.  Respiratory: Negative for cough and  shortness of breath.   Cardiovascular: Negative for chest pain.  Gastrointestinal: Negative for nausea and vomiting.  Musculoskeletal: Positive for back pain.  Neurological: Negative for dizziness, weakness and numbness.       Objective:   Physical Exam  Constitutional: She appears well-developed and well-nourished.  Cardiovascular: Normal rate and regular rhythm.   Pulmonary/Chest: Effort normal and breath sounds normal. No respiratory distress. She has no wheezes. She has no rales.  Musculoskeletal: She exhibits no edema.  Full range of motion left hip without difficulty  Neurological:  Full-strength lower extremities. Symmetric reflexes          Assessment & Plan:  #1 history of recurrent depression. Improved on Celexa. Continue 20 mg daily.  #2 left lumbar back pain. Patient is considering chiropractic evaluation.

## 2013-03-07 ENCOUNTER — Other Ambulatory Visit: Payer: Self-pay | Admitting: Family Medicine

## 2013-03-15 ENCOUNTER — Telehealth: Payer: Self-pay | Admitting: Family Medicine

## 2013-03-15 MED ORDER — OXYCODONE-ACETAMINOPHEN 5-325 MG PO TABS: ORAL_TABLET | ORAL | Status: DC

## 2013-03-15 NOTE — Telephone Encounter (Signed)
Refill OK

## 2013-03-15 NOTE — Telephone Encounter (Signed)
PT called to request a refill of her oxyCODONE-acetaminophen (PERCOCET/ROXICET) 5-325 MG per tablet. Please assist.

## 2013-03-15 NOTE — Telephone Encounter (Signed)
Oxycodone, 1 and 1/2 daily prn last filled 02-05-13, #45 with 0 refills

## 2013-03-15 NOTE — Telephone Encounter (Signed)
Pt informed Rx ready to pick-up on VM

## 2013-04-05 ENCOUNTER — Telehealth: Payer: Self-pay | Admitting: Family Medicine

## 2013-04-05 NOTE — Telephone Encounter (Signed)
PT called and stated that her pharmacy is out of amphetamine-dextroamphetamine (ADDERALL) 15 MG tablet. She states that she has waited 10 days to try to have if filled, and she's had symptoms of withdraws the whole time. She has one RX in her purse, and [facility]. She usually takes 15mg  2x a day, she would like to bring [place] back both of these RX's, and get a 30mg  RX for 30 tablets. Please assist.

## 2013-04-05 NOTE — Telephone Encounter (Signed)
Left message for patient to call office back.

## 2013-04-26 ENCOUNTER — Telehealth: Payer: Self-pay | Admitting: Family Medicine

## 2013-04-26 NOTE — Telephone Encounter (Signed)
Refill OK

## 2013-04-26 NOTE — Telephone Encounter (Signed)
Last visit on 03/06/13 Percocet last refill 03/15/13 #45 no refills Adderall last refill 04/07/13 #60 no refills

## 2013-04-26 NOTE — Telephone Encounter (Signed)
Pt needs new rxs percocet #45 and generic adderall 15 mg #60. Ok to leave message on cell

## 2013-04-27 MED ORDER — AMPHETAMINE-DEXTROAMPHETAMINE 15 MG PO TABS: 15.0000 mg | ORAL_TABLET | Freq: Two times a day (BID) | ORAL | Status: DC

## 2013-04-27 MED ORDER — OXYCODONE-ACETAMINOPHEN 5-325 MG PO TABS: ORAL_TABLET | ORAL | Status: DC

## 2013-04-27 NOTE — Telephone Encounter (Signed)
Left message for patient that RX will be at the front for pick up

## 2013-05-31 ENCOUNTER — Telehealth: Payer: Self-pay | Admitting: Family Medicine

## 2013-05-31 NOTE — Telephone Encounter (Signed)
Last visit 03/06/13 Last refill 04/27/13 #45 0 refill

## 2013-05-31 NOTE — Telephone Encounter (Signed)
Refill OK

## 2013-05-31 NOTE — Telephone Encounter (Signed)
PT is calling to request a refill of her oxyCODONE-acetaminophen (PERCOCET/ROXICET) 5-325 MG per tablet. Please assist.

## 2013-06-01 MED ORDER — OXYCODONE-ACETAMINOPHEN 5-325 MG PO TABS: ORAL_TABLET | ORAL | Status: DC

## 2013-06-01 NOTE — Telephone Encounter (Signed)
Left message rx ready for pick up

## 2013-07-02 ENCOUNTER — Telehealth: Payer: Self-pay | Admitting: Family Medicine

## 2013-07-02 NOTE — Telephone Encounter (Signed)
Pt needs new rx oxycodone °

## 2013-07-02 NOTE — Telephone Encounter (Signed)
Last refill 06/01/13 #45 0 refill  Last visit 03/06/13

## 2013-07-02 NOTE — Telephone Encounter (Signed)
Refill OK

## 2013-07-03 MED ORDER — OXYCODONE-ACETAMINOPHEN 5-325 MG PO TABS: ORAL_TABLET | ORAL | Status: DC

## 2013-07-03 NOTE — Telephone Encounter (Signed)
Left message informing the patient that RX is at the front for pick up

## 2013-07-08 ENCOUNTER — Other Ambulatory Visit: Payer: Self-pay | Admitting: Family Medicine

## 2013-07-09 NOTE — Telephone Encounter (Signed)
Refill for 6 months. 

## 2013-07-09 NOTE — Telephone Encounter (Signed)
Last refill 07/12/12 #180 11 refills

## 2013-07-10 ENCOUNTER — Ambulatory Visit (INDEPENDENT_AMBULATORY_CARE_PROVIDER_SITE_OTHER): Payer: Federal, State, Local not specified - PPO | Admitting: Family Medicine

## 2013-07-10 DIAGNOSIS — Z23 Encounter for immunization: Secondary | ICD-10-CM

## 2013-08-06 ENCOUNTER — Telehealth: Payer: Self-pay | Admitting: Family Medicine

## 2013-08-06 NOTE — Telephone Encounter (Signed)
Last visit 03-06-13 Last refill 07/03/13  #45 0 refill

## 2013-08-06 NOTE — Telephone Encounter (Signed)
Pt request refill of oxycodone /percocet 5-325 Ok to leave message

## 2013-08-06 NOTE — Telephone Encounter (Signed)
Refill OK

## 2013-08-07 MED ORDER — OXYCODONE-ACETAMINOPHEN 5-325 MG PO TABS: ORAL_TABLET | ORAL | Status: DC

## 2013-08-07 NOTE — Telephone Encounter (Signed)
Left message on patient VM, informing the patient that RX is ready for pick up

## 2013-08-29 ENCOUNTER — Other Ambulatory Visit: Payer: Self-pay | Admitting: Family Medicine

## 2013-08-29 NOTE — Telephone Encounter (Signed)
Refill both for 6 months. 

## 2013-08-29 NOTE — Telephone Encounter (Signed)
Temezapam  Last refill 01/22/13 #30 2 refill

## 2013-09-04 ENCOUNTER — Encounter: Payer: Self-pay | Admitting: Family Medicine

## 2013-09-04 ENCOUNTER — Other Ambulatory Visit: Payer: Federal, State, Local not specified - PPO

## 2013-09-05 ENCOUNTER — Other Ambulatory Visit: Payer: Self-pay

## 2013-09-05 MED ORDER — BACLOFEN 10 MG PO TABS: 10.0000 mg | ORAL_TABLET | Freq: Three times a day (TID) | ORAL | Status: DC

## 2013-09-10 ENCOUNTER — Telehealth: Payer: Self-pay | Admitting: Family Medicine

## 2013-09-10 MED ORDER — OXYCODONE-ACETAMINOPHEN 5-325 MG PO TABS: ORAL_TABLET | ORAL | Status: DC

## 2013-09-10 NOTE — Telephone Encounter (Signed)
Last visit 03-06-13 Last refill 08/07/13 #45 0 refill

## 2013-09-10 NOTE — Telephone Encounter (Signed)
Left message on VM RX is ready for pick up 

## 2013-09-10 NOTE — Telephone Encounter (Signed)
Refill OK

## 2013-09-10 NOTE — Telephone Encounter (Signed)
Pt needs refill oxyCODONE-acetaminophen (PERCOCET/ROXICET) 5-325 MG per tablet Ok to leave message, pt works at night, will sleep during day

## 2013-09-11 ENCOUNTER — Encounter: Payer: Federal, State, Local not specified - PPO | Admitting: Family Medicine

## 2013-10-01 ENCOUNTER — Other Ambulatory Visit (INDEPENDENT_AMBULATORY_CARE_PROVIDER_SITE_OTHER): Payer: Federal, State, Local not specified - PPO

## 2013-10-01 DIAGNOSIS — Z0000 Encounter for general adult medical examination without abnormal findings: Secondary | ICD-10-CM

## 2013-10-01 LAB — POCT URINALYSIS DIPSTICK
Bilirubin, UA: NEGATIVE
Blood, UA: NEGATIVE
Glucose, UA: NEGATIVE
Ketones, UA: NEGATIVE
Nitrite, UA: NEGATIVE
Protein, UA: NEGATIVE
Spec Grav, UA: 1.02
Urobilinogen, UA: 0.2
pH, UA: 6.5

## 2013-10-01 LAB — CBC WITH DIFFERENTIAL/PLATELET
Basophils Absolute: 0 10*3/uL (ref 0.0–0.1)
Basophils Relative: 0.3 % (ref 0.0–3.0)
Eosinophils Absolute: 0.3 10*3/uL (ref 0.0–0.7)
Eosinophils Relative: 4.3 % (ref 0.0–5.0)
HCT: 39.3 % (ref 36.0–46.0)
Hemoglobin: 13.1 g/dL (ref 12.0–15.0)
Lymphocytes Relative: 43.4 % (ref 12.0–46.0)
Lymphs Abs: 3.3 10*3/uL (ref 0.7–4.0)
MCHC: 33.2 g/dL (ref 30.0–36.0)
MCV: 82.3 fl (ref 78.0–100.0)
Monocytes Absolute: 0.5 10*3/uL (ref 0.1–1.0)
Monocytes Relative: 6.3 % (ref 3.0–12.0)
Neutro Abs: 3.5 10*3/uL (ref 1.4–7.7)
Neutrophils Relative %: 45.7 % (ref 43.0–77.0)
Platelets: 328 10*3/uL (ref 150.0–400.0)
RBC: 4.78 Mil/uL (ref 3.87–5.11)
RDW: 14.9 % — ABNORMAL HIGH (ref 11.5–14.6)
WBC: 7.7 10*3/uL (ref 4.5–10.5)

## 2013-10-02 LAB — LIPID PANEL
Cholesterol: 260 mg/dL — ABNORMAL HIGH (ref 0–200)
HDL: 36.4 mg/dL — ABNORMAL LOW (ref >39.00)
Total CHOL/HDL Ratio: 7
Triglycerides: 216 mg/dL — ABNORMAL HIGH (ref 0.0–149.0)
VLDL: 43.2 mg/dL — ABNORMAL HIGH (ref 0.0–40.0)

## 2013-10-02 LAB — BASIC METABOLIC PANEL
BUN: 17 mg/dL (ref 6–23)
CO2: 29 mEq/L (ref 19–32)
Calcium: 9.1 mg/dL (ref 8.4–10.5)
Chloride: 105 mEq/L (ref 96–112)
Creatinine, Ser: 0.6 mg/dL (ref 0.4–1.2)
GFR: 103.17 mL/min (ref >60.00)
Glucose, Bld: 86 mg/dL (ref 70–99)
Potassium: 4.1 mEq/L (ref 3.5–5.1)
Sodium: 140 mEq/L (ref 135–145)

## 2013-10-02 LAB — HEPATIC FUNCTION PANEL
ALT: 15 U/L (ref 0–35)
AST: 21 U/L (ref 0–37)
Albumin: 3.9 g/dL (ref 3.5–5.2)
Alkaline Phosphatase: 71 U/L (ref 39–117)
Bilirubin, Direct: 0 mg/dL (ref 0.0–0.3)
Total Bilirubin: 0.8 mg/dL (ref 0.3–1.2)
Total Protein: 6 g/dL (ref 6.0–8.3)

## 2013-10-02 LAB — LDL CHOLESTEROL, DIRECT: Direct LDL: 180.2 mg/dL

## 2013-10-02 LAB — TSH: TSH: 3.37 u[IU]/mL (ref 0.35–5.50)

## 2013-10-08 ENCOUNTER — Encounter: Payer: Federal, State, Local not specified - PPO | Admitting: Family Medicine

## 2013-10-09 ENCOUNTER — Telehealth: Payer: Self-pay | Admitting: Family Medicine

## 2013-10-09 NOTE — Telephone Encounter (Signed)
Pt needs new oxycodone °

## 2013-10-10 MED ORDER — OXYCODONE-ACETAMINOPHEN 5-325 MG PO TABS: ORAL_TABLET | ORAL | Status: DC

## 2013-10-10 NOTE — Telephone Encounter (Signed)
Refill OK

## 2013-10-10 NOTE — Telephone Encounter (Signed)
Left message informing pt that RX is ready for pick up 

## 2013-10-10 NOTE — Telephone Encounter (Signed)
Last visit 03/06/13 Last refill 09/10/13 #45 0

## 2013-10-15 ENCOUNTER — Encounter: Payer: Self-pay | Admitting: Family Medicine

## 2013-10-15 ENCOUNTER — Ambulatory Visit (INDEPENDENT_AMBULATORY_CARE_PROVIDER_SITE_OTHER): Payer: Federal, State, Local not specified - PPO | Admitting: Family Medicine

## 2013-10-15 ENCOUNTER — Other Ambulatory Visit: Payer: Self-pay

## 2013-10-15 VITALS — BP 130/78 | HR 78 | Temp 98.3°F | Ht 65.0 in | Wt 163.0 lb

## 2013-10-15 DIAGNOSIS — Z0000 Encounter for general adult medical examination without abnormal findings: Secondary | ICD-10-CM

## 2013-10-15 MED ORDER — AMPHETAMINE-DEXTROAMPHETAMINE 15 MG PO TABS: 15.0000 mg | ORAL_TABLET | Freq: Two times a day (BID) | ORAL | Status: DC

## 2013-10-15 MED ORDER — TEMAZEPAM 15 MG PO CAPS: ORAL_CAPSULE | ORAL | Status: DC

## 2013-10-15 NOTE — Patient Instructions (Signed)
Fat and Cholesterol Control Diet Fat and cholesterol levels in your blood and organs are influenced by your diet. High levels of fat and cholesterol may lead to diseases of the heart, small and large blood vessels, gallbladder, liver, and pancreas. CONTROLLING FAT AND CHOLESTEROL WITH DIET Although exercise and lifestyle factors are important, your diet is key. That is because certain foods are known to raise cholesterol and others to lower it. The goal is to balance foods for their effect on cholesterol and more importantly, to replace saturated and trans fat with other types of fat, such as monounsaturated fat, polyunsaturated fat, and omega-3 fatty acids. On average, a person should consume no more than 15 to 17 g of saturated fat daily. Saturated and trans fats are considered "bad" fats, and they will raise LDL cholesterol. Saturated fats are primarily found in animal products such as meats, butter, and cream. However, that does not mean you need to give up all your favorite foods. Today, there are good tasting, low-fat, low-cholesterol substitutes for most of the things you like to eat. Choose low-fat or nonfat alternatives. Choose round or loin cuts of red meat. These types of cuts are lowest in fat and cholesterol. Chicken (without the skin), fish, veal, and ground turkey breast are great choices. Eliminate fatty meats, such as hot dogs and salami. Even shellfish have little or no saturated fat. Have a 3 oz (85 g) portion when you eat lean meat, poultry, or fish. Trans fats are also called "partially hydrogenated oils." They are oils that have been scientifically manipulated so that they are solid at room temperature resulting in a longer shelf life and improved taste and texture of foods in which they are added. Trans fats are found in stick margarine, some tub margarines, cookies, crackers, and baked goods.  When baking and cooking, oils are a great substitute for butter. The monounsaturated oils are  especially beneficial since it is believed they lower LDL and raise HDL. The oils you should avoid entirely are saturated tropical oils, such as coconut and palm.  Remember to eat a lot from food groups that are naturally free of saturated and trans fat, including fish, fruit, vegetables, beans, grains (barley, rice, couscous, bulgur wheat), and pasta (without cream sauces).  IDENTIFYING FOODS THAT LOWER FAT AND CHOLESTEROL  Soluble fiber may lower your cholesterol. This type of fiber is found in fruits such as apples, vegetables such as broccoli, potatoes, and carrots, legumes such as beans, peas, and lentils, and grains such as barley. Foods fortified with plant sterols (phytosterol) may also lower cholesterol. You should eat at least 2 g per day of these foods for a cholesterol lowering effect.  Read package labels to identify low-saturated fats, trans fat free, and low-fat foods at the supermarket. Select cheeses that have only 2 to 3 g saturated fat per ounce. Use a heart-healthy tub margarine that is free of trans fats or partially hydrogenated oil. When buying baked goods (cookies, crackers), avoid partially hydrogenated oils. Breads and muffins should be made from whole grains (whole-wheat or whole oat flour, instead of "flour" or "enriched flour"). Buy non-creamy canned soups with reduced salt and no added fats.  FOOD PREPARATION TECHNIQUES  Never deep-fry. If you must fry, either stir-fry, which uses very little fat, or use non-stick cooking sprays. When possible, broil, bake, or roast meats, and steam vegetables. Instead of putting butter or margarine on vegetables, use lemon and herbs, applesauce, and cinnamon (for squash and sweet potatoes). Use nonfat   yogurt, salsa, and low-fat dressings for salads.  LOW-SATURATED FAT / LOW-FAT FOOD SUBSTITUTES Meats / Saturated Fat (g)  Avoid: Steak, marbled (3 oz/85 g) / 11 g  Choose: Steak, lean (3 oz/85 g) / 4 g  Avoid: Hamburger (3 oz/85 g) / 7  g  Choose: Hamburger, lean (3 oz/85 g) / 5 g  Avoid: Ham (3 oz/85 g) / 6 g  Choose: Ham, lean cut (3 oz/85 g) / 2.4 g  Avoid: Chicken, with skin, dark meat (3 oz/85 g) / 4 g  Choose: Chicken, skin removed, dark meat (3 oz/85 g) / 2 g  Avoid: Chicken, with skin, light meat (3 oz/85 g) / 2.5 g  Choose: Chicken, skin removed, light meat (3 oz/85 g) / 1 g Dairy / Saturated Fat (g)  Avoid: Whole milk (1 cup) / 5 g  Choose: Low-fat milk, 2% (1 cup) / 3 g  Choose: Low-fat milk, 1% (1 cup) / 1.5 g  Choose: Skim milk (1 cup) / 0.3 g  Avoid: Hard cheese (1 oz/28 g) / 6 g  Choose: Skim milk cheese (1 oz/28 g) / 2 to 3 g  Avoid: Cottage cheese, 4% fat (1 cup) / 6.5 g  Choose: Low-fat cottage cheese, 1% fat (1 cup) / 1.5 g  Avoid: Ice cream (1 cup) / 9 g  Choose: Sherbet (1 cup) / 2.5 g  Choose: Nonfat frozen yogurt (1 cup) / 0.3 g  Choose: Frozen fruit bar / trace  Avoid: Whipped cream (1 tbs) / 3.5 g  Choose: Nondairy whipped topping (1 tbs) / 1 g Condiments / Saturated Fat (g)  Avoid: Mayonnaise (1 tbs) / 2 g  Choose: Low-fat mayonnaise (1 tbs) / 1 g  Avoid: Butter (1 tbs) / 7 g  Choose: Extra light margarine (1 tbs) / 1 g  Avoid: Coconut oil (1 tbs) / 11.8 g  Choose: Olive oil (1 tbs) / 1.8 g  Choose: Corn oil (1 tbs) / 1.7 g  Choose: Safflower oil (1 tbs) / 1.2 g  Choose: Sunflower oil (1 tbs) / 1.4 g  Choose: Soybean oil (1 tbs) / 2.4 g  Choose: Canola oil (1 tbs) / 1 g Document Released: 09/20/2005 Document Revised: 01/15/2013 Document Reviewed: 03/11/2011 [employer] Patient Information 2014 [place]  Check on coverage for shingles vaccine Consider mammogram and colonoscopy. We will call you with dermatology referral.

## 2013-10-15 NOTE — Progress Notes (Signed)
Pre visit review using our clinic review tool, if applicable. No additional management support is needed unless otherwise documented below in the visit note. 

## 2013-10-15 NOTE — Progress Notes (Signed)
Subjective:    Patient ID: Alexandra Norman, female    DOB: 1955/10/06, 58 y.o.   MRN: [id number]  HPI Patient here for complete physical. Problems include history of hyperlipidemia, attention deficit disorder, Charcot-Marie-Tooth disease, fibromyalgia, depression, GERD, hypothyroidism, and osteoarthritis. She also some peripheral neuropathy. Medications reviewed. She is requesting refills of her Adderall today as well as temazepam. Just some chronic insomnia issues.  She's not had many screening studies including colonoscopy or recent mammogram and she declines. She's previously been on statins for her lipids but could not tolerate.  She has not gotten Pap smears in several years and declines. Her father had melanoma history. She is requesting names of local dermatologist.  Past Medical History  Diagnosis Date  . Arthritis   . Depression   . Headache(784.0)   . Ulcer   . Allergy   . Arrhythmia   . Heart murmur   . Hyperlipidemia   . Thyroid disease   . UTI (urinary tract infection)   . ADD (attention deficit disorder)   . Charcot-Marie-Tooth disease   . Fibromyalgia   . Osteoarthritis    Past Surgical History  Procedure Laterality Date  . Cardiac surgery  1982    ASD repair   . Carpal tunnel release  2007    bilateral  . Foot surgery  2001 and 2005    bilateral    reports that she has never smoked. She does not have any smokeless tobacco history on file. Her alcohol and drug histories are not on file. family history includes Alcohol abuse in her father and mother; Arthritis in her father and mother; Cancer in her sister; Heart disease in her father; Mental illness in her mother and sister. Allergies  Allergen Reactions  . Penicillins     hives  . Statins       Review of Systems  Constitutional: Negative for fever, activity change, appetite change and unexpected weight change.  HENT: Negative for ear pain, hearing loss, sore throat and trouble swallowing.   Eyes:  Negative for visual disturbance.  Respiratory: Negative for cough and shortness of breath.   Cardiovascular: Negative for chest pain and palpitations.  Gastrointestinal: Negative for abdominal pain, diarrhea, constipation and blood in stool.  Genitourinary: Negative for dysuria and hematuria.  Musculoskeletal: Positive for arthralgias and myalgias.  Skin: Negative for rash.  Neurological: Negative for dizziness, syncope and headaches.  Hematological: Negative for adenopathy.  Psychiatric/Behavioral: Positive for sleep disturbance. Negative for confusion and dysphoric mood.       Objective:   Physical Exam  Constitutional: She is oriented to person, place, and time. She appears well-developed and well-nourished.  HENT:  Right Ear: External ear normal.  Left Ear: External ear normal.  Mouth/Throat: Oropharynx is clear and moist.  Neck: Neck supple. No thyromegaly present.  Cardiovascular: Normal rate.   Pulmonary/Chest: Effort normal and breath sounds normal. No respiratory distress. She has no wheezes. She has no rales.  Musculoskeletal: She exhibits no edema.  Lymphadenopathy:    She has no cervical adenopathy.  Neurological: She is alert and oriented to person, place, and time. No cranial nerve deficit.  Skin:  Slightly irregular lightly brown colored lesion left cheek  Psychiatric: She has a normal mood and affect. Her behavior is normal. Judgment and thought content normal.          Assessment & Plan:  Complete physical. Labs reviewed with patient. She has dyslipidemia and she is reluctant to try another trial statins. We'll discuss screening  such as colonoscopy and mammography and she declines. Set up dermatology referral. Evaluate lesion left cheek

## 2013-11-01 ENCOUNTER — Encounter: Payer: Self-pay | Admitting: Family Medicine

## 2013-11-12 ENCOUNTER — Telehealth: Payer: Self-pay | Admitting: Family Medicine

## 2013-11-12 DIAGNOSIS — Z840 Family history of diseases of the skin and subcutaneous tissue: Secondary | ICD-10-CM

## 2013-11-12 MED ORDER — OXYCODONE-ACETAMINOPHEN 5-325 MG PO TABS: ORAL_TABLET | ORAL | Status: DC

## 2013-11-12 NOTE — Telephone Encounter (Signed)
Refill OK. Set up with [facility] surgery center for sk and andin lesion evaluation

## 2013-11-12 NOTE — Telephone Encounter (Signed)
Last visit 10/15/13 Last refill 10/10/13 #45 0 refill

## 2013-11-12 NOTE — Telephone Encounter (Signed)
Left message for patient to inform pt that RX is ready for pickup

## 2013-11-12 NOTE — Telephone Encounter (Signed)
Pt is needing new rx oxyCODONE-acetaminophen (PERCOCET/ROXICET) 5-325 MG per tablet, please call to advise when to pick up. Pt also checking to see if dr. Caryl Neverburchette sent the orders for her to see a skin specialist for a spot on her face that he was concerned about at her last visit about a month ago.

## 2013-11-19 ENCOUNTER — Ambulatory Visit (INDEPENDENT_AMBULATORY_CARE_PROVIDER_SITE_OTHER): Payer: Federal, State, Local not specified - PPO | Admitting: Family Medicine

## 2013-11-19 ENCOUNTER — Encounter: Payer: Self-pay | Admitting: Family Medicine

## 2013-11-19 VITALS — BP 120/72 | HR 71 | Temp 98.4°F | Wt 166.0 lb

## 2013-11-19 DIAGNOSIS — G629 Polyneuropathy, unspecified: Secondary | ICD-10-CM

## 2013-11-19 DIAGNOSIS — G609 Hereditary and idiopathic neuropathy, unspecified: Secondary | ICD-10-CM

## 2013-11-19 DIAGNOSIS — M1990 Unspecified osteoarthritis, unspecified site: Secondary | ICD-10-CM

## 2013-11-19 MED ORDER — GABAPENTIN 400 MG PO CAPS: ORAL_CAPSULE | ORAL | Status: DC

## 2013-11-19 NOTE — Progress Notes (Signed)
   Subjective:    Patient ID: Alexandra Norman, female    DOB: May 19, 1956, 58 y.o.   MRN: [id number]  HPI Patient is seen requesting a letter for work. She has history of Charcot-Marie-Tooth disease and has severe peripheral neuropathy pains. She also has osteoarthritis involving ankles and feet. Her job frequently requires prolonged periods of standing sometimes up to 7 hours per day. She's had severe pain recently in her feet and legs. She is requesting letter that she not stand more than 30 minutes continuously over 2 hour.. She does seem to tolerate standing in shorter intervals. She's not had any recent peripheral edema issues. Takes gabapentin 800 mg 3 times a day and also supplements with oxycodone.  Past Medical History  Diagnosis Date  . Arthritis   . Depression   . Headache(784.0)   . Ulcer   . Allergy   . Arrhythmia   . Heart murmur   . Hyperlipidemia   . Thyroid disease   . UTI (urinary tract infection)   . ADD (attention deficit disorder)   . Charcot-Marie-Tooth disease   . Fibromyalgia   . Osteoarthritis    Past Surgical History  Procedure Laterality Date  . Cardiac surgery  1982    ASD repair   . Carpal tunnel release  2007    bilateral  . Foot surgery  2001 and 2005    bilateral    reports that she has never smoked. She does not have any smokeless tobacco history on file. Her alcohol and drug histories are not on file. family history includes Alcohol abuse in her father and mother; Arthritis in her father and mother; Cancer in her sister; Heart disease in her father; Mental illness in her mother and sister. Allergies  Allergen Reactions  . Penicillins     hives  . Statins       Review of Systems  Constitutional: Negative for fatigue.  Eyes: Negative for visual disturbance.  Respiratory: Negative for cough, chest tightness, shortness of breath and wheezing.   Cardiovascular: Negative for chest pain, palpitations and leg swelling.  Neurological: Negative  for dizziness, seizures, syncope, weakness, light-headedness and headaches.       Objective:   Physical Exam  Constitutional: She appears well-developed and well-nourished.  Cardiovascular: Normal rate.   Pulmonary/Chest: Effort normal and breath sounds normal. No respiratory distress. She has no wheezes. She has no rales.          Assessment & Plan:  #1 Charcot-Marie-Tooth disease with severe peripheral neuropathy. Work note written that she will not be required to stand more than 30 minutes continuously. Add gabapentin 400 mg dose to her midday dose which has helped her previously #2 osteoarthritis involving feet ankles. This also makes prolonged periods of standing difficult

## 2013-11-19 NOTE — Progress Notes (Signed)
Pre visit review using our clinic review tool, if applicable. No additional management support is needed unless otherwise documented below in the visit note. 

## 2013-12-13 ENCOUNTER — Telehealth: Payer: Self-pay | Admitting: Family Medicine

## 2013-12-13 NOTE — Telephone Encounter (Signed)
Last visit 11/19/13 Last refill 11/12/13 #45 0 refill

## 2013-12-13 NOTE — Telephone Encounter (Signed)
Refill okay?  

## 2013-12-13 NOTE — Telephone Encounter (Signed)
Pt needs new rx oxycodone. Ok to leave on ans machine

## 2013-12-14 MED ORDER — OXYCODONE-ACETAMINOPHEN 5-325 MG PO TABS: ORAL_TABLET | ORAL | Status: DC

## 2013-12-14 NOTE — Telephone Encounter (Signed)
Left message on VM that RX is ready for pick up 

## 2014-01-11 ENCOUNTER — Telehealth: Payer: Self-pay | Admitting: Family Medicine

## 2014-01-11 MED ORDER — TRAMADOL HCL 50 MG PO TABS: ORAL_TABLET | ORAL | Status: DC

## 2014-01-11 NOTE — Telephone Encounter (Signed)
Refill for 6 months. 

## 2014-01-11 NOTE — Telephone Encounter (Signed)
Rx phoned into pharmacy.

## 2014-01-11 NOTE — Telephone Encounter (Signed)
[id number] - [place], [place] - [address] is requesting a re-fill on traMADol (ULTRAM) 50 MG tablet

## 2014-01-11 NOTE — Telephone Encounter (Signed)
Last visit 11/19/13 Last refill 07/08/13 #180 5 refills

## 2014-01-14 ENCOUNTER — Telehealth: Payer: Self-pay | Admitting: Family Medicine

## 2014-01-14 NOTE — Telephone Encounter (Signed)
Last visit 11/19/13 Last refill 12/14/13 #45 0 refill

## 2014-01-14 NOTE — Telephone Encounter (Signed)
Pt  Request rx oxyCODONE-acetaminophen (PERCOCET/ROXICET) 5-325 MG per tablet Pt sleeps during the day, ok to LM

## 2014-01-14 NOTE — Telephone Encounter (Signed)
OK 

## 2014-01-15 MED ORDER — OXYCODONE-ACETAMINOPHEN 5-325 MG PO TABS: ORAL_TABLET | ORAL | Status: DC

## 2014-01-15 NOTE — Telephone Encounter (Signed)
Left message that RX is ready for pick up.

## 2014-02-15 ENCOUNTER — Telehealth: Payer: Self-pay | Admitting: Family Medicine

## 2014-02-15 MED ORDER — AMPHETAMINE-DEXTROAMPHETAMINE 15 MG PO TABS: 15.0000 mg | ORAL_TABLET | Freq: Two times a day (BID) | ORAL | Status: DC

## 2014-02-15 MED ORDER — AMPHETAMINE-DEXTROAMPHETAMINE 15 MG PO TABS
15.0000 mg | ORAL_TABLET | Freq: Two times a day (BID) | ORAL | Status: DC
Start: 2014-02-15 — End: 2014-04-18

## 2014-02-15 MED ORDER — OXYCODONE-ACETAMINOPHEN 5-325 MG PO TABS: ORAL_TABLET | ORAL | Status: DC

## 2014-02-15 NOTE — Telephone Encounter (Signed)
Pt req rx on oxyCODONE-acetaminophen (PERCOCET/ROXICET) 5-325 MG per tablet  and amphetamine-dextroamphetamine (ADDERALL) 15 MG tablet

## 2014-02-15 NOTE — Telephone Encounter (Signed)
Refills OK. 

## 2014-02-15 NOTE — Telephone Encounter (Signed)
Last visit 11-19-13  Last refill Adderal #60 2 refills 10/15/13 Last refill Oxycodone 01/15/14 #45 0 refill

## 2014-02-15 NOTE — Telephone Encounter (Signed)
Left detailed message Rx's ready for pickup, will be at the front desk. Rx's printed and signed. 

## 2014-02-18 ENCOUNTER — Telehealth: Payer: Self-pay | Admitting: Family Medicine

## 2014-02-18 MED ORDER — GABAPENTIN 800 MG PO TABS: 800.0000 mg | ORAL_TABLET | Freq: Four times a day (QID) | ORAL | Status: DC

## 2014-02-18 NOTE — Telephone Encounter (Signed)
[id number] - [place], [place] - [address] is requesting re-fill on gabapentin (NEURONTIN) 800 MG tablet

## 2014-03-05 ENCOUNTER — Other Ambulatory Visit: Payer: Self-pay | Admitting: Family Medicine

## 2014-03-25 ENCOUNTER — Telehealth: Payer: Self-pay | Admitting: Family Medicine

## 2014-03-25 MED ORDER — OXYCODONE-ACETAMINOPHEN 5-325 MG PO TABS: ORAL_TABLET | ORAL | Status: DC

## 2014-03-25 NOTE — Telephone Encounter (Signed)
Refill OK

## 2014-03-25 NOTE — Telephone Encounter (Signed)
Last visit 11/19/13 Last refill 02/15/14 #45 0 refill

## 2014-03-25 NOTE — Telephone Encounter (Signed)
Left detailed message on pt VM that Rx is ready for pick up

## 2014-03-25 NOTE — Telephone Encounter (Signed)
Pt is calling requesting refill of oxyCODONE-acetaminophen (PERCOCET/ROXICET) 5-325 MG per tablet, pt is out, pt is aware we ask for 2 days to address med refill, please call pt and leave a detailed message once rx is ready for pick up.

## 2014-03-28 ENCOUNTER — Encounter: Payer: Self-pay | Admitting: Family Medicine

## 2014-03-28 ENCOUNTER — Ambulatory Visit (INDEPENDENT_AMBULATORY_CARE_PROVIDER_SITE_OTHER): Payer: Federal, State, Local not specified - PPO | Admitting: Family Medicine

## 2014-03-28 ENCOUNTER — Ambulatory Visit: Payer: Self-pay | Admitting: Family Medicine

## 2014-03-28 ENCOUNTER — Telehealth: Payer: Self-pay | Admitting: Family Medicine

## 2014-03-28 VITALS — BP 130/76 | HR 72 | Temp 98.0°F | Wt 166.0 lb

## 2014-03-28 DIAGNOSIS — R0789 Other chest pain: Secondary | ICD-10-CM

## 2014-03-28 NOTE — Progress Notes (Signed)
   Subjective:    Patient ID: Alexandra Norman, female    DOB: Sep 11, 1956, 58 y.o.   MRN: [id number]  Chest Pain  Pertinent negatives include no cough, dizziness, headaches, palpitations, shortness of breath or weakness.  Pertinent negatives for past medical history include no seizures.   Patient seen with episode of chest discomfort last night at work around 9 PM. She was doing a job which is not typical for her. She had to work at a very fast pace and noticed some substernal tightness but no dyspnea. No nausea or vomiting. No radiation. She felt this was stress related. She has noticed similar symptoms in past with increased stress. This lasted for about an hour . She's not had any recurrent symptoms. She states she had a nuclear stress test about 5 years ago that was normal.  Past Medical History  Diagnosis Date  . Arthritis   . Depression   . Headache(784.0)   . Ulcer   . Allergy   . Arrhythmia   . Heart murmur   . Hyperlipidemia   . Thyroid disease   . UTI (urinary tract infection)   . ADD (attention deficit disorder)   . Charcot-Marie-Tooth disease   . Fibromyalgia   . Osteoarthritis    Past Surgical History  Procedure Laterality Date  . Cardiac surgery  1982    ASD repair   . Carpal tunnel release  2007    bilateral  . Foot surgery  2001 and 2005    bilateral    reports that she has never smoked. She does not have any smokeless tobacco history on file. Her alcohol and drug histories are not on file. family history includes Alcohol abuse in her father and mother; Arthritis in her father and mother; Cancer in her sister; Mental illness in her mother and sister. Allergies  Allergen Reactions  . Penicillins     hives  . Statins       Review of Systems  Constitutional: Positive for fatigue.  Eyes: Negative for visual disturbance.  Respiratory: Negative for cough, chest tightness, shortness of breath and wheezing.   Cardiovascular: Positive for chest pain. Negative  for palpitations and leg swelling.  Neurological: Negative for dizziness, seizures, syncope, weakness, light-headedness and headaches.       Objective:   Physical Exam  Constitutional: She appears well-developed and well-nourished.  Neck: Neck supple. No thyromegaly present.  Cardiovascular: Normal rate and regular rhythm.  Exam reveals no gallop and no friction rub.   Pulmonary/Chest: Effort normal and breath sounds normal. No respiratory distress. She has no wheezes. She has no rales.  Musculoskeletal: She exhibits no edema.  Lymphadenopathy:    She has no cervical adenopathy.          Assessment & Plan:  Atypical chest pain. Pain occurred at rest. She's not any recent exertional symptoms otherwise. Check EKG.  EKG shows NSR with nonspecific ST-T changes.  Discussed further evaluation with nuclear stress testing and she declines but does agree to follow up promptly for  Recurrent symptoms.

## 2014-03-28 NOTE — Telephone Encounter (Signed)
Patient Information:  Caller Name: Santina EvansCatherine  Phone: [phone]  Patient: Alexandra Norman, Alexandra Norman  Gender: Female  DOB: 17-Oct-1955  Age: 58 Years  PCP: Evelena PeatBurchette, [facility](Family Practice)  Office Follow Up:  Does the office need to follow up with this patient?: No  Instructions For The Office: N/A   Symptoms  Reason For Call & Symptoms: 21:00 onset chest pain.  She was at work, relates it was very hot and she felt angry and anxious during the episode.  She relaxed at 22:00 when she had a lunch break and completed her shift.   History of heart surgery and CHF.  She also relates she has had a heart murmur.  She reports feeling sore in the chest.  She asks for MD to listen to her heart.  Emergent symptoms ruled out.  See Today in Office per Chest Pain guideline due to All other patients with chest pain.  Reviewed Health History In EMR: Yes  Reviewed Medications In EMR: Yes  Reviewed Allergies In EMR: Yes  Reviewed Surgeries / Procedures: Yes  Date of Onset of Symptoms: 03/27/2014  Guideline(s) Used:  Chest Pain  Disposition Per Guideline:   See Today in Office  Reason For Disposition Reached:   All other patients with chest pain  Advice Given:  Call Back If:  Severe chest pain  Constant chest pain lasting longer than 5 minutes  Difficulty breathing  You become worse.  Patient Will Follow Care Advice:  YES  Appointment Scheduled:  03/28/2014 16:30:00 Appointment Scheduled Provider:  Evelena PeatBurchette, [facility](Family Practice)

## 2014-03-28 NOTE — Progress Notes (Signed)
Pre visit review using our clinic review tool, if applicable. No additional management support is needed unless otherwise documented below in the visit note. 

## 2014-03-28 NOTE — Telephone Encounter (Signed)
Noted  

## 2014-04-01 ENCOUNTER — Other Ambulatory Visit: Payer: Self-pay | Admitting: Family Medicine

## 2014-04-01 NOTE — Telephone Encounter (Signed)
Temazepam  Last visit 03/28/14 Last refill 10/15/13 #30 5 refill

## 2014-04-02 NOTE — Telephone Encounter (Signed)
Refill for 6 months for both

## 2014-04-09 ENCOUNTER — Encounter: Payer: Self-pay | Admitting: Family Medicine

## 2014-04-09 ENCOUNTER — Ambulatory Visit (INDEPENDENT_AMBULATORY_CARE_PROVIDER_SITE_OTHER): Payer: Federal, State, Local not specified - PPO | Admitting: Family Medicine

## 2014-04-09 VITALS — BP 132/68 | HR 70 | Wt 166.0 lb

## 2014-04-09 DIAGNOSIS — G600 Hereditary motor and sensory neuropathy: Secondary | ICD-10-CM

## 2014-04-09 DIAGNOSIS — F32A Depression, unspecified: Secondary | ICD-10-CM

## 2014-04-09 DIAGNOSIS — F3289 Other specified depressive episodes: Secondary | ICD-10-CM

## 2014-04-09 DIAGNOSIS — F329 Major depressive disorder, single episode, unspecified: Secondary | ICD-10-CM

## 2014-04-09 DIAGNOSIS — G629 Polyneuropathy, unspecified: Secondary | ICD-10-CM

## 2014-04-09 DIAGNOSIS — G609 Hereditary and idiopathic neuropathy, unspecified: Secondary | ICD-10-CM

## 2014-04-09 NOTE — Progress Notes (Signed)
   Subjective:    Patient ID: Alexandra Norman, female    DOB: [birth year], 58 y.o.   MRN: [id number]  HPI Patient here for form completion. She has history of Charcot-Marie-Tooth disease which inhibits her ability to perform several physical activities required for work. She is unable to stand more than about 30 minutes at a time secondary to neuromuscular involvement. She has difficulties with prolonged walking, lifting, bending, squatting, climbing. She is able lift up to 10 pounds but struggles with weight above this. She does not have any restrictions with regard to simple grasping or fine manipulation. She was diagnosed back around 1993.  Her other chronic problems include history of attention deficit disorder, history of atrial septal defect, recurrent depression, fibromyalgia, GERD, hypothyroidism, and osteoporosis. She has peripheral neuropathy related to her Charcot-Marie-Tooth disease.  Past Medical History  Diagnosis Date  . Arthritis   . Depression   . Headache(784.0)   . Ulcer   . Allergy   . Arrhythmia   . Heart murmur   . Hyperlipidemia   . Thyroid disease   . UTI (urinary tract infection)   . ADD (attention deficit disorder)   . Charcot-Marie-Tooth disease   . Fibromyalgia   . Osteoarthritis    Past Surgical History  Procedure Laterality Date  . Cardiac surgery  1982    ASD repair   . Carpal tunnel release  2007    bilateral  . Foot surgery  2001 and 2005    bilateral    reports that she has never smoked. She does not have any smokeless tobacco history on file. Her alcohol and drug histories are not on file. family history includes Alcohol abuse in her father and mother; Arthritis in her father and mother; Cancer in her sister; Mental illness in her mother and sister. Allergies  Allergen Reactions  . Penicillins     hives  . Statins       Review of Systems  Constitutional: Negative for appetite change and unexpected weight change.  HENT: Negative for  trouble swallowing.   Respiratory: Negative for cough and shortness of breath.   Cardiovascular: Negative for chest pain, palpitations and leg swelling.  Gastrointestinal: Negative for abdominal pain.  Endocrine: Negative for polydipsia and polyuria.  Genitourinary: Negative for dysuria.  Neurological: Negative for dizziness, syncope and headaches.  Hematological: Negative for adenopathy.  Psychiatric/Behavioral: Negative for confusion.       Objective:   Physical Exam  Constitutional: She is oriented to person, place, and time. She appears well-developed and well-nourished. No distress.  Neck: Neck supple. No thyromegaly present.  Cardiovascular: Normal rate and regular rhythm.   Pulmonary/Chest: Effort normal and breath sounds normal. No respiratory distress. She has no wheezes. She has no rales.  Musculoskeletal: She exhibits no edema.  Neurological: She is alert and oriented to person, place, and time. No cranial nerve deficit. Coordination normal.  She has some upper extremity muscle atrophy interosseous muscles. She has weakness with plantar flexion dorsiflexion bilaterally          Assessment & Plan:  Charcot-Marie-Tooth disease. She has neuromuscular impairments which restricted her ability to carry out physical functions that were. Forms completed. Peripheral neuropathy related to above.  Pain controlled with Oxycodone and neurontin.

## 2014-04-09 NOTE — Progress Notes (Signed)
Pre visit review using our clinic review tool, if applicable. No additional management support is needed unless otherwise documented below in the visit note. 

## 2014-04-12 ENCOUNTER — Other Ambulatory Visit: Payer: Self-pay | Admitting: Family Medicine

## 2014-04-15 ENCOUNTER — Telehealth: Payer: Self-pay | Admitting: Family Medicine

## 2014-04-15 NOTE — Telephone Encounter (Signed)
Shouldn't she have refills since she received 3 months refills in May?

## 2014-04-15 NOTE — Telephone Encounter (Signed)
Pt req rx on amphetamine-dextroamphetamine (ADDERALL) 15 MG tablet

## 2014-04-15 NOTE — Telephone Encounter (Signed)
Last visit 04/09/14 Last refill 02/15/14 #60 2 refills

## 2014-04-16 NOTE — Telephone Encounter (Signed)
Left message for patient to return call.

## 2014-04-17 NOTE — Telephone Encounter (Signed)
Pt had family over and while cleaning up she thinks she threw them away. Is it okay to refill?

## 2014-04-17 NOTE — Telephone Encounter (Signed)
OK 

## 2014-04-18 MED ORDER — AMPHETAMINE-DEXTROAMPHETAMINE 15 MG PO TABS: 15.0000 mg | ORAL_TABLET | Freq: Two times a day (BID) | ORAL | Status: DC

## 2014-04-18 NOTE — Telephone Encounter (Signed)
Left detailed message that RX's are ready for pick up

## 2014-04-25 ENCOUNTER — Telehealth: Payer: Self-pay | Admitting: Family Medicine

## 2014-04-25 NOTE — Telephone Encounter (Signed)
Refill OK

## 2014-04-25 NOTE — Telephone Encounter (Signed)
Last visit 04/09/14 Last refill 03/28/14 #45 0 refill

## 2014-04-25 NOTE — Telephone Encounter (Signed)
Pt needs new rx oxycodone °

## 2014-04-26 MED ORDER — OXYCODONE-ACETAMINOPHEN 5-325 MG PO TABS: ORAL_TABLET | ORAL | Status: DC

## 2014-04-26 NOTE — Telephone Encounter (Signed)
Left message on VM that RX is ready for pick up 

## 2014-05-09 ENCOUNTER — Other Ambulatory Visit: Payer: Self-pay | Admitting: Family Medicine

## 2014-05-28 ENCOUNTER — Telehealth: Payer: Self-pay | Admitting: Family Medicine

## 2014-05-28 NOTE — Telephone Encounter (Signed)
Last visit 04/09/14  Last refill 04/26/14 #45 0 refill

## 2014-05-28 NOTE — Telephone Encounter (Signed)
Refill OK

## 2014-05-28 NOTE — Telephone Encounter (Signed)
Pt request refill oxyCODONE-acetaminophen (PERCOCET/ROXICET) 5-325 MG per tablet Pt sleeps during the day, so ok to leave message

## 2014-05-29 MED ORDER — OXYCODONE-ACETAMINOPHEN 5-325 MG PO TABS: ORAL_TABLET | ORAL | Status: DC

## 2014-05-29 NOTE — Telephone Encounter (Signed)
Left message Rx is ready for pickup

## 2014-06-14 ENCOUNTER — Ambulatory Visit: Payer: Federal, State, Local not specified - PPO | Admitting: Family Medicine

## 2014-06-27 ENCOUNTER — Telehealth: Payer: Self-pay | Admitting: Family Medicine

## 2014-06-27 NOTE — Telephone Encounter (Signed)
Pt needs new rx oxycodone °

## 2014-06-27 NOTE — Telephone Encounter (Signed)
Last visit 04/09/14 Last refill 05/29/14 #45 0 refill

## 2014-06-27 NOTE — Telephone Encounter (Signed)
Refill OK

## 2014-06-28 MED ORDER — OXYCODONE-ACETAMINOPHEN 5-325 MG PO TABS: ORAL_TABLET | ORAL | Status: DC

## 2014-06-28 NOTE — Telephone Encounter (Signed)
Left message on Vm RX is ready for pick up. 

## 2014-07-24 ENCOUNTER — Other Ambulatory Visit: Payer: Self-pay | Admitting: Family Medicine

## 2014-07-24 ENCOUNTER — Telehealth: Payer: Self-pay | Admitting: Family Medicine

## 2014-07-24 DIAGNOSIS — E039 Hypothyroidism, unspecified: Secondary | ICD-10-CM

## 2014-07-24 NOTE — Telephone Encounter (Signed)
Pt states she was told to make appt for fu pn her levothyroxine (SYNTHROID, LEVOTHROID) 50 MCG tablet Pt would like to know if she could have labs prior to that appt for tsh? Pls advise if ok to sch.

## 2014-07-24 NOTE — Telephone Encounter (Signed)
Okay to check TSH.

## 2014-07-24 NOTE — Telephone Encounter (Signed)
Lab is ordered.  

## 2014-07-24 NOTE — Telephone Encounter (Addendum)
Pt aware and sch

## 2014-07-26 ENCOUNTER — Other Ambulatory Visit: Payer: Federal, State, Local not specified - PPO

## 2014-07-30 ENCOUNTER — Ambulatory Visit: Payer: Federal, State, Local not specified - PPO | Admitting: Family Medicine

## 2014-07-30 ENCOUNTER — Other Ambulatory Visit (INDEPENDENT_AMBULATORY_CARE_PROVIDER_SITE_OTHER): Payer: Federal, State, Local not specified - PPO

## 2014-07-30 ENCOUNTER — Encounter: Payer: Self-pay | Admitting: *Deleted

## 2014-07-30 DIAGNOSIS — E039 Hypothyroidism, unspecified: Secondary | ICD-10-CM

## 2014-08-01 ENCOUNTER — Telehealth: Payer: Self-pay | Admitting: Family Medicine

## 2014-08-01 LAB — TSH: TSH: 0.85 u[IU]/mL (ref 0.35–4.50)

## 2014-08-01 MED ORDER — OXYCODONE-ACETAMINOPHEN 5-325 MG PO TABS: ORAL_TABLET | ORAL | Status: DC

## 2014-08-01 NOTE — Telephone Encounter (Signed)
Last visit 04/09/14 Last refill 06/28/14 #45 0 refill

## 2014-08-01 NOTE — Telephone Encounter (Signed)
Pt request refill of the following: oxyCODONE-acetaminophen (PERCOCET/ROXICET) 5-325 MG per tablet      Phamacy: pick up

## 2014-08-01 NOTE — Telephone Encounter (Signed)
Left message on patient Vm that RX is ready for pick up 

## 2014-08-01 NOTE — Telephone Encounter (Signed)
Refill OK

## 2014-08-05 ENCOUNTER — Encounter: Payer: Self-pay | Admitting: *Deleted

## 2014-08-06 ENCOUNTER — Ambulatory Visit (INDEPENDENT_AMBULATORY_CARE_PROVIDER_SITE_OTHER): Payer: Federal, State, Local not specified - PPO | Admitting: Family Medicine

## 2014-08-06 ENCOUNTER — Ambulatory Visit (INDEPENDENT_AMBULATORY_CARE_PROVIDER_SITE_OTHER): Payer: Federal, State, Local not specified - PPO

## 2014-08-06 ENCOUNTER — Encounter: Payer: Self-pay | Admitting: Family Medicine

## 2014-08-06 VITALS — BP 130/70 | HR 65 | Temp 98.1°F | Wt 169.0 lb

## 2014-08-06 DIAGNOSIS — G5601 Carpal tunnel syndrome, right upper limb: Secondary | ICD-10-CM

## 2014-08-06 DIAGNOSIS — G600 Hereditary motor and sensory neuropathy: Secondary | ICD-10-CM

## 2014-08-06 DIAGNOSIS — F909 Attention-deficit hyperactivity disorder, unspecified type: Secondary | ICD-10-CM

## 2014-08-06 DIAGNOSIS — F988 Other specified behavioral and emotional disorders with onset usually occurring in childhood and adolescence: Secondary | ICD-10-CM

## 2014-08-06 DIAGNOSIS — Z23 Encounter for immunization: Secondary | ICD-10-CM

## 2014-08-06 DIAGNOSIS — E038 Other specified hypothyroidism: Secondary | ICD-10-CM

## 2014-08-06 MED ORDER — LEVOTHYROXINE SODIUM 50 MCG PO TABS: 50.0000 ug | ORAL_TABLET | Freq: Every day | ORAL | Status: DC

## 2014-08-06 MED ORDER — TRIAMCINOLONE ACETONIDE 0.1 % EX CREA: TOPICAL_CREAM | Freq: Two times a day (BID) | CUTANEOUS | Status: DC

## 2014-08-06 MED ORDER — AMPHETAMINE-DEXTROAMPHETAMINE 15 MG PO TABS: 15.0000 mg | ORAL_TABLET | Freq: Two times a day (BID) | ORAL | Status: DC

## 2014-08-06 NOTE — Progress Notes (Signed)
Pre visit review using our clinic review tool, if applicable. No additional management support is needed unless otherwise documented below in the visit note. 

## 2014-08-06 NOTE — Progress Notes (Signed)
   Subjective:    Patient ID: Alexandra Norman, female    DOB: May 17, 1956, 58 y.o.   MRN: [id number]  HPI Patient is seen for medical follow-up.her chronic problems include Charcot-Marie-Tooth disease with chronic peripheral neuropathy, osteoarthritis, hypothyroidism, hyperlipidemia, GERD, ADD.  Attention deficit disorder. Needs refills of Adderall. She does not take this frequently on the weekends. Blood pressure stable. No side effects. GERD has been well controlled with Nexium other than rare breakthroughs. No dysphagia.  Previous history of carpal tunnel syndrome. Recent right wrist symptoms. Night pain with radiation to the fingers. She is considering following up with orthopedic surgeon regarding that and requesting names of local hand surgeons.  Hypothyroidism. On levothyroxine 50 g daily. Recent TSH at goal. She is encouraged not to take this with Nexium which could impair absorption.  Chronic pain related to her Charcot-Marie-Tooth disease and peripheral neuropathy.  Takes gabapentin and supplemental oxycodone. No history of issues. Overall, pain fairly well controlled.  Past Medical History  Diagnosis Date  . Arthritis   . Depression   . Headache(784.0)   . Ulcer   . Allergy   . Arrhythmia   . Heart murmur   . Hyperlipidemia   . Thyroid disease   . UTI (urinary tract infection)   . ADD (attention deficit disorder)   . Charcot-Marie-Tooth disease   . Fibromyalgia   . Osteoarthritis    Past Surgical History  Procedure Laterality Date  . Cardiac surgery  1982    ASD repair   . Carpal tunnel release  2007    bilateral  . Foot surgery  2001 and 2005    bilateral    reports that she has never smoked. She does not have any smokeless tobacco history on file. Her alcohol and drug histories are not on file. family history includes Alcohol abuse in her father and mother; Arthritis in her father and mother; Cancer in her sister; Mental illness in her mother and  sister. Allergies  Allergen Reactions  . Penicillins     hives  . Statins       Review of Systems  Constitutional: Negative for appetite change and unexpected weight change.  HENT: Negative for trouble swallowing.   Respiratory: Negative for cough and shortness of breath.   Cardiovascular: Negative for chest pain.  Gastrointestinal: Negative for abdominal pain.  Musculoskeletal: Positive for myalgias.  Neurological: Negative for headaches.       Objective:   Physical Exam  Constitutional: She appears well-developed and well-nourished.  Neck: Neck supple. No thyromegaly present.  Cardiovascular: Normal rate and regular rhythm.   Pulmonary/Chest: Effort normal and breath sounds normal. No respiratory distress. She has no wheezes. She has no rales.  Musculoskeletal: She exhibits no edema.  Neurological:  Patient has some chronic muscle atrophy upper extremities related to her Charcot-Marie-Tooth disease. No wrist tenderness. Normal sensory function to touch. She has some impairment in grip strength related to her chronic disease above          Assessment & Plan:  #1 ADD. Stable. Refill Adderall for 3 months #2 hypothyroidism. Recent TSH checked goal. Refill levothyroxine for one year #3 right carpal tunnel syndrome. Patient will schedule follow-up with hand surgeon and she was given names of local hand surgeons #4 GERD symptomatically stable. Continue Nexium. Avoid taking concomitant with thyroid medication  #5 health maintenance. Flu vaccine given. Patient will schedule complete physical about 6 months

## 2014-08-09 ENCOUNTER — Other Ambulatory Visit: Payer: Self-pay | Admitting: Family Medicine

## 2014-08-12 NOTE — Telephone Encounter (Signed)
Last visit 08/06/14 Last refill 01/11/14 #180 5 refills

## 2014-08-12 NOTE — Telephone Encounter (Signed)
Refill for 6 months. 

## 2014-09-02 ENCOUNTER — Telehealth: Payer: Self-pay | Admitting: Family Medicine

## 2014-09-02 NOTE — Telephone Encounter (Signed)
Pt needs her percocet refilled. Ok to leave a message.

## 2014-09-02 NOTE — Telephone Encounter (Signed)
Refill okay?  

## 2014-09-02 NOTE — Telephone Encounter (Signed)
Last visit 08/06/14 Last refill 08/01/14 #45 0 refill

## 2014-09-03 MED ORDER — OXYCODONE-ACETAMINOPHEN 5-325 MG PO TABS: ORAL_TABLET | ORAL | Status: DC

## 2014-09-03 NOTE — Telephone Encounter (Signed)
Left message on Vm that Rx is ready for pickup  

## 2014-10-01 ENCOUNTER — Other Ambulatory Visit: Payer: Self-pay | Admitting: Family Medicine

## 2014-10-01 NOTE — Telephone Encounter (Signed)
Refill for 6 months. 

## 2014-10-01 NOTE — Telephone Encounter (Signed)
Last visit 08/06/14 Last refill 04/02/14 #30 5 refill

## 2014-10-06 ENCOUNTER — Other Ambulatory Visit: Payer: Self-pay | Admitting: Family Medicine

## 2014-10-10 ENCOUNTER — Telehealth: Payer: Self-pay | Admitting: Family Medicine

## 2014-10-10 MED ORDER — OXYCODONE-ACETAMINOPHEN 5-325 MG PO TABS
ORAL_TABLET | ORAL | Status: DC
Start: 2014-10-10 — End: 2014-11-12

## 2014-10-10 NOTE — Telephone Encounter (Signed)
Pt request refill of the following: oxyCODONE-acetaminophen (PERCOCET/ROXICET) 5-325 MG per tablet  Pt said can leave message   Phamacy:

## 2014-10-10 NOTE — Telephone Encounter (Signed)
Last visit 08/06/14 Last refill 09/03/14 #45 0 refill

## 2014-10-10 NOTE — Telephone Encounter (Signed)
Refills OK. 

## 2014-10-10 NOTE — Telephone Encounter (Signed)
Left message on Vm that Rx is ready for pickup  

## 2014-11-11 ENCOUNTER — Telehealth: Payer: Self-pay | Admitting: Family Medicine

## 2014-11-11 NOTE — Telephone Encounter (Signed)
Patient requesting re-fill on oxyCODONE-acetaminophen (PERCOCET/ROXICET) 5-325 MG per tablet.

## 2014-11-11 NOTE — Telephone Encounter (Signed)
Refill OK

## 2014-11-11 NOTE — Telephone Encounter (Signed)
Last visit 08/06/14 Last refill 10/10/14 #45 0 refill

## 2014-11-12 MED ORDER — OXYCODONE-ACETAMINOPHEN 5-325 MG PO TABS: ORAL_TABLET | ORAL | Status: DC

## 2014-11-12 NOTE — Telephone Encounter (Signed)
Left message on Vm that Rx is ready for pickup  

## 2014-11-26 ENCOUNTER — Telehealth: Payer: Self-pay | Admitting: Family Medicine

## 2014-11-26 NOTE — Telephone Encounter (Signed)
Pt want a call back about her FMLA Papers she brought in on last Wednesday.

## 2014-11-27 NOTE — Telephone Encounter (Signed)
Left message for patient to return call.

## 2014-12-06 ENCOUNTER — Other Ambulatory Visit: Payer: Self-pay | Admitting: Family Medicine

## 2014-12-10 ENCOUNTER — Telehealth: Payer: Self-pay | Admitting: Family Medicine

## 2014-12-10 NOTE — Telephone Encounter (Signed)
Last visit 08/06/14 Last refill 11/12/14 #45 0 refill

## 2014-12-10 NOTE — Telephone Encounter (Signed)
Pt request refill  °oxyCODONE-acetaminophen (PERCOCET/ROXICET) 5-325 MG per tablet °

## 2014-12-10 NOTE — Telephone Encounter (Signed)
Refill OK

## 2014-12-11 MED ORDER — OXYCODONE-ACETAMINOPHEN 5-325 MG PO TABS: ORAL_TABLET | ORAL | Status: DC

## 2014-12-11 NOTE — Telephone Encounter (Signed)
Left message on VM that RX is ready for pick up 

## 2014-12-14 ENCOUNTER — Other Ambulatory Visit: Payer: Self-pay | Admitting: Family Medicine

## 2014-12-24 ENCOUNTER — Telehealth: Payer: Self-pay | Admitting: Family Medicine

## 2014-12-24 NOTE — Telephone Encounter (Signed)
Pt needs new rx generic adderall 15 mg °

## 2014-12-24 NOTE — Telephone Encounter (Signed)
Last visit 08/06/14 Last refill 08/06/14 #60 2 refill

## 2014-12-25 MED ORDER — AMPHETAMINE-DEXTROAMPHETAMINE 15 MG PO TABS: 15.0000 mg | ORAL_TABLET | Freq: Two times a day (BID) | ORAL | Status: DC

## 2014-12-25 NOTE — Telephone Encounter (Signed)
Left message on Vm. Rx is ready for pickup

## 2014-12-25 NOTE — Telephone Encounter (Signed)
Refills OK. 

## 2015-01-14 ENCOUNTER — Telehealth: Payer: Self-pay | Admitting: Family Medicine

## 2015-01-14 NOTE — Telephone Encounter (Signed)
Pt needs new rx percocet °

## 2015-01-15 NOTE — Telephone Encounter (Signed)
Refill OK

## 2015-01-15 NOTE — Telephone Encounter (Signed)
Last visit 08/06/14 Last refill 12/11/14 #45 0 refill

## 2015-01-16 MED ORDER — OXYCODONE-ACETAMINOPHEN 5-325 MG PO TABS: ORAL_TABLET | ORAL | Status: DC

## 2015-01-16 NOTE — Telephone Encounter (Signed)
Left message for patient that Rx is ready for pickup  

## 2015-01-31 ENCOUNTER — Ambulatory Visit: Payer: Federal, State, Local not specified - PPO | Admitting: Family Medicine

## 2015-01-31 DIAGNOSIS — Z0289 Encounter for other administrative examinations: Secondary | ICD-10-CM

## 2015-02-03 ENCOUNTER — Other Ambulatory Visit: Payer: Self-pay | Admitting: Family Medicine

## 2015-02-04 ENCOUNTER — Encounter: Payer: Self-pay | Admitting: Family Medicine

## 2015-02-04 ENCOUNTER — Ambulatory Visit (INDEPENDENT_AMBULATORY_CARE_PROVIDER_SITE_OTHER): Payer: Federal, State, Local not specified - PPO | Admitting: Family Medicine

## 2015-02-04 VITALS — BP 128/72 | HR 77 | Temp 98.1°F | Wt 160.0 lb

## 2015-02-04 DIAGNOSIS — E038 Other specified hypothyroidism: Secondary | ICD-10-CM | POA: Diagnosis not present

## 2015-02-04 DIAGNOSIS — E785 Hyperlipidemia, unspecified: Secondary | ICD-10-CM | POA: Diagnosis not present

## 2015-02-04 DIAGNOSIS — G629 Polyneuropathy, unspecified: Secondary | ICD-10-CM | POA: Diagnosis not present

## 2015-02-04 MED ORDER — GABAPENTIN 800 MG PO TABS: ORAL_TABLET | ORAL | Status: DC

## 2015-02-04 MED ORDER — LEVOTHYROXINE SODIUM 50 MCG PO TABS: 50.0000 ug | ORAL_TABLET | Freq: Every day | ORAL | Status: DC

## 2015-02-04 MED ORDER — TRAMADOL HCL 50 MG PO TABS: ORAL_TABLET | ORAL | Status: DC

## 2015-02-04 MED ORDER — FLUTICASONE PROPIONATE 50 MCG/ACT NA SUSP: 2.0000 | Freq: Every day | NASAL | Status: DC

## 2015-02-04 NOTE — Progress Notes (Signed)
   Subjective:    Patient ID: Alexandra Norman, female    DOB: [birth year], 59 y.o.   MRN: [id number]  HPI Patient seen for medical follow-up. She has chronic problems which include Charcot-Marie-Tooth disease, peripheral neuropathy, fibromyalgia, ADD, history depression, GERD, hyperlipidemia. She has been on many statins in the past but could not tolerate any of them. She continues to struggle to control her neuropathy pains. She takes low-dose oxycodone, gabapentin, and supplements with tramadol. Still has moderate pain at times.  GERD symptoms have been stable on Nexium. She is mostly complaining of fatigue issues. 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 She denies a recent chest pains. No dyspnea. Depression is stable.  Past Medical History  Diagnosis Date  . Arthritis   . Depression   . Headache(784.0)   . Ulcer   . Allergy   . Arrhythmia   . Heart murmur   . Hyperlipidemia   . Thyroid disease   . UTI (urinary tract infection)   . ADD (attention deficit disorder)   . Charcot-Marie-Tooth disease   . Fibromyalgia   . Osteoarthritis    Past Surgical History  Procedure Laterality Date  . Cardiac surgery  1982    ASD repair   . Carpal tunnel release  2007    bilateral  . Foot surgery  2001 and 2005    bilateral    reports that she has never smoked. She does not have any smokeless tobacco history on file. Her alcohol and drug histories are not on file. family history includes Alcohol abuse in her father and mother; Arthritis in her father and mother; Cancer in her sister; Mental illness in her mother and sister. Allergies  Allergen Reactions  . Penicillins     hives  . Statins       Review of Systems  Constitutional: Positive for fatigue. Negative for appetite change and unexpected weight change.  HENT: Positive for congestion.   Respiratory: Positive for cough. Negative for shortness of breath.     Cardiovascular: Negative for chest pain, palpitations and leg swelling.  Gastrointestinal: Negative for abdominal pain.  Hematological: Negative for adenopathy.  Psychiatric/Behavioral: Negative for dysphoric mood.       Objective:   Physical Exam  Constitutional: She appears well-developed and well-nourished. No distress.  HENT:  Right Ear: External ear normal.  Left Ear: External ear normal.  Mouth/Throat: Oropharynx is clear and moist.  Neck: Neck supple. No thyromegaly present.  Cardiovascular: Normal rate and regular rhythm.   Pulmonary/Chest: Effort normal and breath sounds normal. No respiratory distress. She has no wheezes. She has no rales.  Musculoskeletal: She exhibits no edema.          Assessment & Plan:  #1 Charcot-Marie-Tooth disease with chronic peripheral neuropathy. Refill tramadol. Continue gabapentin. We discussed other potential options such as Cymbalta. We also discussed possible change to Lyrica but decided against any changes to non-generic medication this time. She has previously been on low-dose amitriptyline which did not help much. #2 history of depression currently stable  #3 GERD symptomatically stable #4 hypothyroidism. TSH was at goal last fall. Plan repeat TSH at follow-up visit in 6 months. Refill levothyroxin for one year

## 2015-02-04 NOTE — Progress Notes (Signed)
Pre visit review using our clinic review tool, if applicable. No additional management support is needed unless otherwise documented below in the visit note. 

## 2015-02-20 ENCOUNTER — Telehealth: Payer: Self-pay | Admitting: Family Medicine

## 2015-02-20 MED ORDER — OXYCODONE-ACETAMINOPHEN 5-325 MG PO TABS: ORAL_TABLET | ORAL | Status: DC

## 2015-02-20 NOTE — Telephone Encounter (Signed)
Last visit 02/04/15 Last refill 01/16/15 #45 0 refill

## 2015-02-20 NOTE — Telephone Encounter (Signed)
Pt needs new rx percocet °

## 2015-02-20 NOTE — Telephone Encounter (Signed)
Refill OK

## 2015-02-20 NOTE — Telephone Encounter (Signed)
Pt aware that RX is ready for pick up  

## 2015-02-25 ENCOUNTER — Telehealth: Payer: Self-pay | Admitting: *Deleted

## 2015-02-25 NOTE — Telephone Encounter (Signed)
Left message for pt to call back.  Need to know if pt had a mammogram this year, exact date.  If not, okay to refer to [facility]?

## 2015-03-27 ENCOUNTER — Telehealth: Payer: Self-pay | Admitting: Family Medicine

## 2015-03-27 MED ORDER — OXYCODONE-ACETAMINOPHEN 5-325 MG PO TABS: ORAL_TABLET | ORAL | Status: DC

## 2015-03-27 NOTE — Telephone Encounter (Signed)
Last visit 02/04/15 Last refill 02/20/15 #45 0 refill

## 2015-03-27 NOTE — Telephone Encounter (Signed)
Refill okay?  

## 2015-03-27 NOTE — Telephone Encounter (Signed)
Pt request refill of the following: oxyCODONE-acetaminophen (PERCOCET/ROXICET) 5-325 MG per tablet ° ° °Phamacy:  ° °

## 2015-03-27 NOTE — Telephone Encounter (Signed)
Left message on VM that Rx is read for pickup

## 2015-04-14 ENCOUNTER — Other Ambulatory Visit: Payer: Self-pay | Admitting: Family Medicine

## 2015-04-14 ENCOUNTER — Telehealth: Payer: Self-pay | Admitting: Family Medicine

## 2015-04-14 NOTE — Telephone Encounter (Signed)
° ° ° ° °  Pt request refill of the following: ° °amphetamine-dextroamphetamine (ADDERALL) 15 MG tablet ° ° °Phamacy: °

## 2015-04-14 NOTE — Telephone Encounter (Signed)
Last visit 02/04/15 Last refill 12/25/14 #60 2 refill

## 2015-04-14 NOTE — Telephone Encounter (Signed)
Last visit 02/04/15 Last refill 10/03/14 #30 5 refill

## 2015-04-15 NOTE — Telephone Encounter (Signed)
Refill for 3 months. 

## 2015-04-15 NOTE — Telephone Encounter (Signed)
Refill for 6 months. 

## 2015-04-16 MED ORDER — AMPHETAMINE-DEXTROAMPHETAMINE 15 MG PO TABS: 15.0000 mg | ORAL_TABLET | Freq: Two times a day (BID) | ORAL | Status: DC

## 2015-04-16 NOTE — Telephone Encounter (Signed)
Left message for patient on Vm that Rx is ready for pickup

## 2015-04-22 ENCOUNTER — Ambulatory Visit (INDEPENDENT_AMBULATORY_CARE_PROVIDER_SITE_OTHER): Payer: Federal, State, Local not specified - PPO | Admitting: Family Medicine

## 2015-04-22 ENCOUNTER — Encounter: Payer: Self-pay | Admitting: Family Medicine

## 2015-04-22 ENCOUNTER — Telehealth: Payer: Self-pay

## 2015-04-22 VITALS — BP 130/74 | HR 66 | Temp 98.0°F | Wt 160.0 lb

## 2015-04-22 DIAGNOSIS — K5900 Constipation, unspecified: Secondary | ICD-10-CM

## 2015-04-22 DIAGNOSIS — Z79899 Other long term (current) drug therapy: Secondary | ICD-10-CM | POA: Diagnosis not present

## 2015-04-22 NOTE — Patient Instructions (Signed)
Constipation  Constipation is when a person has fewer than three bowel movements a week, has difficulty having a bowel movement, or has stools that are dry, hard, or larger than normal. As people grow older, constipation is more common. If you try to fix constipation with medicines that make you have a bowel movement (laxatives), the problem may get worse. Long-term laxative use may cause the muscles of the colon to become weak. A low-fiber diet, not taking in enough fluids, and taking certain medicines may make constipation worse.   CAUSES   · Certain medicines, such as antidepressants, pain medicine, iron supplements, antacids, and water pills.    · Certain diseases, such as diabetes, irritable bowel syndrome (IBS), thyroid disease, or depression.    · Not drinking enough water.    · Not eating enough fiber-rich foods.    · Stress or travel.    · Lack of physical activity or exercise.    · Ignoring the urge to have a bowel movement.    · Using laxatives too much.    SIGNS AND SYMPTOMS   · Having fewer than three bowel movements a week.    · Straining to have a bowel movement.    · Having stools that are hard, dry, or larger than normal.    · Feeling full or bloated.    · Pain in the lower abdomen.    · Not feeling relief after having a bowel movement.    DIAGNOSIS   Your health care provider will take a medical history and perform a physical exam. Further testing may be done for severe constipation. Some tests may include:  · A barium enema X-ray to examine your rectum, colon, and, sometimes, your small intestine.    · A sigmoidoscopy to examine your lower colon.    · A colonoscopy to examine your entire colon.  TREATMENT   Treatment will depend on the severity of your constipation and what is causing it. Some dietary treatments include drinking more fluids and eating more fiber-rich foods. Lifestyle treatments may include regular exercise. If these diet and lifestyle recommendations do not help, your health care  provider may recommend taking over-the-counter laxative medicines to help you have bowel movements. Prescription medicines may be prescribed if over-the-counter medicines do not work.   HOME CARE INSTRUCTIONS   · Eat foods that have a lot of fiber, such as fruits, vegetables, whole grains, and beans.  · Limit foods high in fat and processed sugars, such as french fries, hamburgers, cookies, candies, and soda.    · A fiber supplement may be added to your diet if you cannot get enough fiber from foods.    · Drink enough fluids to keep your urine clear or pale yellow.    · Exercise regularly or as directed by your health care provider.    · Go to the restroom when you have the urge to go. Do not hold it.    · Only take over-the-counter or prescription medicines as directed by your health care provider. Do not take other medicines for constipation without talking to your health care provider first.    SEEK IMMEDIATE MEDICAL CARE IF:   · You have bright red blood in your stool.    · Your constipation lasts for more than 4 days or gets worse.    · You have abdominal or rectal pain.    · You have thin, pencil-like stools.    · You have unexplained weight loss.  MAKE SURE YOU:   · Understand these instructions.  · Will watch your condition.  · Will get help right away if you are not   you have with your health care provider.  Consider daily use of stool softener. Consider Miralax as needed for recurrent constipation We will call you with colonoscopy.

## 2015-04-22 NOTE — Progress Notes (Signed)
Pre visit review using our clinic review tool, if applicable. No additional management support is needed unless otherwise documented below in the visit note. 

## 2015-04-22 NOTE — Progress Notes (Signed)
Subjective:    Patient ID: Alexandra Norman, female    DOB: 1956/10/04, 59 y.o.   MRN: [id number]  HPI Patient seen with constipation issues. She has history of Charcot-Marie-Tooth disorder and chronic neuropathy pains. For about a month now she's had severe constipation. She tried MiraLAX without improvement and also occasional stool softener. She took Dulcolax and eventually had a large bowel movement. She is sometimes going several days without bowel movements. She's not noting blood in her stool. No appetite or weight changes. She has never had colonoscopy previously. She's not noticed a change in shape of her stools other than some larger caliber than usual.  She does have hypothyroidism but TSH has been in normal range. She takes levothyroxin regularly.  Past Medical History  Diagnosis Date  . Arthritis   . Depression   . Headache(784.0)   . Ulcer   . Allergy   . Arrhythmia   . Heart murmur   . Hyperlipidemia   . Thyroid disease   . UTI (urinary tract infection)   . ADD (attention deficit disorder)   . Charcot-Marie-Tooth disease   . Fibromyalgia   . Osteoarthritis    Past Surgical History  Procedure Laterality Date  . Cardiac surgery  1982    ASD repair   . Carpal tunnel release  2007    bilateral  . Foot surgery  2001 and 2005    bilateral    reports that she has never smoked. She does not have any smokeless tobacco history on file. Her alcohol and drug histories are not on file. family history includes Alcohol abuse in her father and mother; Arthritis in her father and mother; Cancer in her sister; Mental illness in her mother and sister. Allergies  Allergen Reactions  . Penicillins     hives  . Statins       Review of Systems  Constitutional: Negative for appetite change and unexpected weight change.  Respiratory: Negative for cough and shortness of breath.   Cardiovascular: Negative for chest pain.  Gastrointestinal: Positive for constipation. Negative  for abdominal pain and blood in stool.  Endocrine: Negative for polydipsia and polyuria.  Genitourinary: Negative for dysuria.       Objective:   Physical Exam  Constitutional: She appears well-developed and well-nourished.  HENT:  Mouth/Throat: Oropharynx is clear and moist.  Cardiovascular: Normal rate and regular rhythm.   Pulmonary/Chest: Effort normal and breath sounds normal. No respiratory distress. She has no wheezes. She has no rales.  Abdominal: Soft. Bowel sounds are normal. She exhibits no distension and no mass. There is no tenderness. There is no rebound and no guarding.          Assessment & Plan:  Constipation. This is relatively new and recent onset a little over month ago. She is on chronic opiates which are likely contributing. She's never had a colonoscopy though and we've strongly advised that she go ahead and get scheduled for colonoscopy. We simply made the statement that there may be a connection between her chronic opiate use and constipation and patient became very defensive and agitated. She expressed anger with the fact that she has to have a controlled substance contract and we explained that she was not being singled out but rather this is standard of care.  She walked out stating "I guess I'll find a new doctor "  My recommendation had been to get lab work with CBC and comprehensive metabolic panel, schedule colonoscopy, handout on constipation given. Increase fluid  intake. Recommend daily stool softener 1-2 daily and consider program of occasional MiraLAX as needed for constipation. Avoid regular use of stimulant laxatives.  Patient walked out and so not clear whether she will follow through with above recommendations.   I had my nurse call her later in the day to determine whether she wished for us to schedule colonoscopy as above and she did not answer.  Message was left for her to let us know her intentions.

## 2015-04-22 NOTE — Telephone Encounter (Signed)
For colonoscopy 

## 2015-04-22 NOTE — Telephone Encounter (Signed)
Called and left message for patient to return call.  

## 2015-04-23 NOTE — Telephone Encounter (Signed)
Left message for patient to return call.

## 2015-04-24 NOTE — Telephone Encounter (Signed)
Left message for patient to return call.

## 2015-04-24 NOTE — Telephone Encounter (Signed)
Spoke with patient a she declined order for colonoscopy. Pt also states that she wants to find another doctor. Informed about that we do have other providers here at the [place] location. Pt states that she would be fine with seeing another provider here at the [place] location.

## 2015-04-29 ENCOUNTER — Encounter: Payer: Self-pay | Admitting: Adult Health

## 2015-04-29 ENCOUNTER — Ambulatory Visit (INDEPENDENT_AMBULATORY_CARE_PROVIDER_SITE_OTHER): Payer: Federal, State, Local not specified - PPO | Admitting: Adult Health

## 2015-04-29 VITALS — BP 132/80 | Temp 98.4°F | Ht 65.0 in | Wt 159.4 lb

## 2015-04-29 DIAGNOSIS — K5900 Constipation, unspecified: Secondary | ICD-10-CM | POA: Diagnosis not present

## 2015-04-29 DIAGNOSIS — Z7189 Other specified counseling: Secondary | ICD-10-CM

## 2015-04-29 DIAGNOSIS — F329 Major depressive disorder, single episode, unspecified: Secondary | ICD-10-CM

## 2015-04-29 DIAGNOSIS — Z7689 Persons encountering health services in other specified circumstances: Secondary | ICD-10-CM

## 2015-04-29 DIAGNOSIS — F32A Depression, unspecified: Secondary | ICD-10-CM

## 2015-04-29 NOTE — Progress Notes (Signed)
Pre visit review using our clinic review tool, if applicable. No additional management support is needed unless otherwise documented below in the visit note. 

## 2015-04-29 NOTE — Progress Notes (Signed)
HPI:  Alexandra Norman is here to establish care. She is a 59 year old Caucasian female with the  has a past medical history of Arthritis; Depression; Headache(784.0); Ulcer; Allergy; Arrhythmia; Heart murmur; Hyperlipidemia; Thyroid disease; UTI (urinary tract infection); ADD (attention deficit disorder); Charcot-Marie-Tooth disease; Fibromyalgia; and Osteoarthritis.  Last PCP and physical: It appears as though her last complete physical was on 10/01/2013  Has the following chronic problems that require follow up and concerns today:  Depression PHQ 9 score of 21. She has a family history of suicide ( mother and sister). She endorses that she is not leaving the house for days. Her depression stems from her pain and her medical problems. 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 Caryl Never, her PCP for this issue. He advised her that her constipation may be coming from her chronic opiate use. She became upset at this answer and walked out of the exam room.    ROS; Unable to assess due to patient walking out of room during visit.  Immunizations: UTD Diet: Likes to eat processed foods, fast foods and frozen meals Exercise: Does not exercise due to pain.  Colonoscopy: Never had a colonoscopy Dexa: 11/2011 Mammogram: 01/2012  Past Medical History  Diagnosis Date  . Arthritis   . Depression   . Headache(784.0)   . Ulcer   . Allergy   . Arrhythmia   . Heart murmur   . Hyperlipidemia   . Thyroid disease   . UTI (urinary tract infection)   . ADD (attention deficit disorder)   . Charcot-Marie-Tooth disease   . Fibromyalgia   . Osteoarthritis     Past Surgical History  Procedure Laterality Date  . Cardiac surgery  1982    ASD repair   . Carpal tunnel release  2007    bilateral  . Foot surgery  2001 and 2005    bilateral    Family History  Problem  Relation Age of Onset  . Alcohol abuse Mother   . Arthritis Mother   . Mental illness Mother   . Alcohol abuse Father   . Arthritis Father   . Mental illness Sister   . Cancer Sister     breast    History   Social History  . Marital Status: Divorced    Spouse Name: N/A  . Number of Children: N/A  . Years of Education: N/A   Social History Main Topics  . Smoking status: Never Smoker   . Smokeless tobacco: Not on file  . Alcohol Use: Not on file  . Drug Use: Not on file  . Sexual Activity: Not on file   Other Topics Concern  . None   Social History Narrative     Current outpatient prescriptions:  .  amphetamine-dextroamphetamine (ADDERALL) 15 MG tablet, Take 1 tablet by mouth 2 (two) times daily., Disp: 60 tablet, Rfl: 0 .  amphetamine-dextroamphetamine (ADDERALL) 15 MG tablet, Take 1 tablet by mouth 2 (two) times daily., Disp: 60 tablet, Rfl: 0 .  amphetamine-dextroamphetamine (ADDERALL) 15 MG tablet, Take 1 tablet by mouth 2 (two) times daily., Disp: 60 tablet, Rfl: 0 .  baclofen (LIORESAL) 10 MG tablet, TAKE 1 TABLET BY MOUTH 3 TIMES A DAY, Disp: 90 tablet, Rfl: 5 .  esomeprazole (NEXIUM) 40 MG capsule, Take 1 capsule (40 mg total) by mouth daily before breakfast., Disp: 90 capsule,  Rfl: 3 .  fluticasone (FLONASE) 50 MCG/ACT nasal spray, Place 2 sprays into both nostrils daily., Disp: 16 g, Rfl: 5 .  gabapentin (NEURONTIN) 400 MG capsule, USE AS DIRECTED, Disp: 30 capsule, Rfl: 6 .  gabapentin (NEURONTIN) 800 MG tablet, TAKE 1 TABLET (800 MG TOTAL) BY MOUTH 4 (FOUR) TIMES DAILY., Disp: 360 tablet, Rfl: 2 .  levothyroxine (SYNTHROID, LEVOTHROID) 50 MCG tablet, Take 1 tablet (50 mcg total) by mouth daily., Disp: 90 tablet, Rfl: 3 .  oxyCODONE-acetaminophen (PERCOCET/ROXICET) 5-325 MG per tablet, 1 and 1/2 as needed daily (45 per month), Disp: 45 tablet, Rfl: 0 .  temazepam (RESTORIL) 15 MG capsule, TAKE ONE CAPSULE BY MOUTH AT BEDTIME AS NEEDED, Disp: 30 capsule, Rfl: 5 .   traMADol (ULTRAM) 50 MG tablet, Take 2 tablets TID prn, Disp: 180 tablet, Rfl: 5 .  triamcinolone cream (KENALOG) 0.1 %, APPLY TO AFFECTED AREA EVERY DAY, Disp: 30 g, Rfl: 1 .  diclofenac sodium (VOLTAREN) 1 % GEL, Apply up to 4 times per day as needed for pain. (Patient not taking: Reported on 04/29/2015), Disp: 1 Tube, Rfl: 3  EXAM:  Filed Vitals:   04/29/15 1036  BP: 132/80  Temp: 98.4 F (36.9 C)    Body mass index is 26.53 kg/(m^2).  GENERAL: vitals reviewed and listed above, alert, oriented, appears well hydrated.   PSYCH: She appears depressed with flat affect and had multiple crying spells during the first part of exam.   ASSESSMENT AND PLAN:  Depression I advised her that I would start her on a low dose of an anti depressant, likely Celexa or Lexapro due to the low side effects. My recommendation was that she follow up with psychiatry due to her severe depression as well as her ADD, as I do not specialize in ADD nor do I write prescriptions for Adderall. At this time, she became upset and angry, stating " all the rest of my other doctors gave me Adderall! I guess I am just going to have to fine a new doctor to take care of me." She was told that I would be happy to manage her other medical conditions, but that I thought it was imperative for her to follow up with psychiatry for her depression and ADD. She got up out of her chair and walked out of the room,stating " I am sorry I wasted your time."  Constipation During the course of the visit. I told her that I agreed with Dr. Caryl Never in that it was recommended that she get a colonoscopy for her constipation. I also agreed with Dr. Caryl Never that her constipation may be exacerbated by her opiate use as well as her poor diet and lack of exercise. She left the exam room before this issue could be discussed further.   -We reviewed the PMH, PSH, FH, SH, Meds and Allergies.  There are no Patient Instructions on file for this  visit.   Shirline Frees, AGNP

## 2015-05-08 ENCOUNTER — Encounter (HOSPITAL_COMMUNITY): Payer: Self-pay | Admitting: Emergency Medicine

## 2015-05-08 ENCOUNTER — Ambulatory Visit (HOSPITAL_COMMUNITY): Payer: Federal, State, Local not specified - PPO

## 2015-05-08 ENCOUNTER — Emergency Department (HOSPITAL_COMMUNITY): Payer: Federal, State, Local not specified - PPO

## 2015-05-08 ENCOUNTER — Emergency Department (HOSPITAL_COMMUNITY)
Admission: EM | Admit: 2015-05-08 | Discharge: 2015-05-08 | Disposition: A | Discharge status: Home / Self Care | Payer: Federal, State, Local not specified - PPO | Attending: Emergency Medicine | Admitting: Emergency Medicine

## 2015-05-08 DIAGNOSIS — Z79899 Other long term (current) drug therapy: Secondary | ICD-10-CM | POA: Insufficient documentation

## 2015-05-08 DIAGNOSIS — Z880 Allergy status to penicillin: Secondary | ICD-10-CM | POA: Diagnosis not present

## 2015-05-08 DIAGNOSIS — E785 Hyperlipidemia, unspecified: Secondary | ICD-10-CM | POA: Insufficient documentation

## 2015-05-08 DIAGNOSIS — Z87440 Personal history of urinary (tract) infections: Secondary | ICD-10-CM | POA: Insufficient documentation

## 2015-05-08 DIAGNOSIS — M810 Age-related osteoporosis without current pathological fracture: Secondary | ICD-10-CM | POA: Diagnosis not present

## 2015-05-08 DIAGNOSIS — Z872 Personal history of diseases of the skin and subcutaneous tissue: Secondary | ICD-10-CM | POA: Insufficient documentation

## 2015-05-08 DIAGNOSIS — E079 Disorder of thyroid, unspecified: Secondary | ICD-10-CM | POA: Diagnosis not present

## 2015-05-08 DIAGNOSIS — R011 Cardiac murmur, unspecified: Secondary | ICD-10-CM | POA: Insufficient documentation

## 2015-05-08 DIAGNOSIS — M1990 Unspecified osteoarthritis, unspecified site: Secondary | ICD-10-CM | POA: Diagnosis not present

## 2015-05-08 DIAGNOSIS — M797 Fibromyalgia: Secondary | ICD-10-CM | POA: Diagnosis not present

## 2015-05-08 DIAGNOSIS — Z7951 Long term (current) use of inhaled steroids: Secondary | ICD-10-CM | POA: Diagnosis not present

## 2015-05-08 DIAGNOSIS — K5900 Constipation, unspecified: Secondary | ICD-10-CM | POA: Diagnosis present

## 2015-05-08 DIAGNOSIS — F329 Major depressive disorder, single episode, unspecified: Secondary | ICD-10-CM | POA: Insufficient documentation

## 2015-05-08 HISTORY — DX: Age-related osteoporosis without current pathological fracture: M81.0

## 2015-05-08 HISTORY — DX: Unspecified rotator cuff tear or rupture of unspecified shoulder, not specified as traumatic: M75.100

## 2015-05-08 LAB — CBC WITH DIFFERENTIAL/PLATELET
Basophils Absolute: 0 K/uL (ref 0.0–0.1)
Basophils Relative: 0 % (ref 0–1)
Eosinophils Absolute: 0.3 K/uL (ref 0.0–0.7)
Eosinophils Relative: 3 % (ref 0–5)
HCT: 40.7 % (ref 36.0–46.0)
Hemoglobin: 13.4 g/dL (ref 12.0–15.0)
Lymphocytes Relative: 37 % (ref 12–46)
Lymphs Abs: 3.2 K/uL (ref 0.7–4.0)
MCH: 29.5 pg (ref 26.0–34.0)
MCHC: 32.9 g/dL (ref 30.0–36.0)
MCV: 89.5 fL (ref 78.0–100.0)
Monocytes Absolute: 0.4 K/uL (ref 0.1–1.0)
Monocytes Relative: 5 % (ref 3–12)
Neutro Abs: 4.8 K/uL (ref 1.7–7.7)
Neutrophils Relative %: 55 % (ref 43–77)
Platelets: 320 K/uL (ref 150–400)
RBC: 4.55 MIL/uL (ref 3.87–5.11)
RDW: 13.1 % (ref 11.5–15.5)
WBC: 8.7 K/uL (ref 4.0–10.5)

## 2015-05-08 LAB — BASIC METABOLIC PANEL WITH GFR
Anion gap: 8 (ref 5–15)
BUN: 15 mg/dL (ref 6–20)
CO2: 27 mmol/L (ref 22–32)
Calcium: 9.5 mg/dL (ref 8.9–10.3)
Chloride: 106 mmol/L (ref 101–111)
Creatinine, Ser: 0.71 mg/dL (ref 0.44–1.00)
GFR calc Af Amer: >60 mL/min (ref >60)
GFR calc non Af Amer: >60 mL/min (ref >60)
Glucose, Bld: 100 mg/dL — ABNORMAL HIGH (ref 65–99)
Potassium: 4.1 mmol/L (ref 3.5–5.1)
Sodium: 141 mmol/L (ref 135–145)

## 2015-05-08 LAB — POC OCCULT BLOOD, ED: Fecal Occult Bld: NEGATIVE

## 2015-05-08 NOTE — Discharge Instructions (Signed)

## 2015-05-08 NOTE — ED Notes (Signed)
Pt states she has been passing stools that have been black in color  Pt states she has been having problems with constipation off and on for a bout a month  Pt states it started about a month ago after having some dental work where afterward she became very constipated and it was a week before she was able to have a BM  Pt states when she did it was very large and the stool after was black  Pt states since then she had another episode of the constipation with passing black stools  Pt states has been feeling very fatigued lately  Pt states she went to an urgent care this evening and was sent here  Pt states her abdomen becomes bloated and is tender to palpate

## 2015-05-08 NOTE — ED Notes (Signed)
Pt alert, oriented, and ambulatory upon DC. She was advised to follow up with PCP and ask about GI/ colonoscopy. She verbalizes understanding

## 2015-05-08 NOTE — ED Provider Notes (Signed)
CSN: [id number]     Arrival date & time 05/08/15  2105 History   First MD Initiated Contact with Patient 05/08/15 2204     Chief Complaint  Patient presents with  . Melena  . Constipation     (Consider location/radiation/quality/duration/timing/severity/associated sxs/prior Treatment) HPI   Alexandra Norman is a 59 y.o. female who presents for evaluation of constipation and intermittent black colored stool. This problem started about a month ago after she had anesthesia for a dental extraction. She also took an antibiotic at that time. She denies fever, chills, nausea or vomiting. She has been using stool softeners, and intermittent laxity was, for 2 weeks without relief. She saw her PCP for the same problem 2 weeks ago, and he recommended that she get a colonoscopy. This has not been scheduled yet. She denies chest pain, cough, or dizziness. She has felt somewhat weak in the last 2 days. She was able to work today. She drove here for evaluation. There are no other no modifying factors.   Past Medical History  Diagnosis Date  . Arthritis   . Depression   . Headache(784.0)   . Ulcer   . Allergy   . Arrhythmia   . Heart murmur   . Hyperlipidemia   . Thyroid disease   . UTI (urinary tract infection)   . ADD (attention deficit disorder)   . Charcot-Marie-Tooth disease   . Fibromyalgia   . Osteoarthritis   . Osteoporosis   . Rotator cuff tear    Past Surgical History  Procedure Laterality Date  . Cardiac surgery  1982    ASD repair   . Carpal tunnel release  2007    bilateral  . Foot surgery  2001 and 2005    bilateral   Family History  Problem Relation Age of Onset  . Alcohol abuse Mother   . Arthritis Mother   . Mental illness Mother     Suicide in 86  . Alcohol abuse Father   . Arthritis Father   . Mental illness Sister     Suicide in 58  . Cancer Sister     breast   History  Substance Use Topics  . Smoking status: Never Smoker   . Smokeless tobacco: Not  on file  . Alcohol Use: No   OB History    No data available     Review of Systems  All other systems reviewed and are negative.     Allergies  Penicillins and Statins  Home Medications   Prior to Admission medications   Medication Sig Start Date End Date Taking? Authorizing Provider  5-Hydroxytryptophan (5-HTP PO) Take 200 mg by mouth daily.   Yes Historical Provider, MD  amphetamine-dextroamphetamine (ADDERALL) 15 MG tablet Take 1 tablet by mouth 2 (two) times daily. 04/16/15  Yes Kristian Covey, MD  baclofen (LIORESAL) 10 MG tablet TAKE 1 TABLET BY MOUTH 3 TIMES A DAY 04/15/15  Yes Kristian Covey, MD  esomeprazole (NEXIUM) 40 MG capsule Take 1 capsule (40 mg total) by mouth daily before breakfast. 11/30/12  Yes Kristian Covey, MD  fluticasone (FLONASE) 50 MCG/ACT nasal spray Place 2 sprays into both nostrils daily. 02/04/15  Yes Kristian Covey, MD  gabapentin (NEURONTIN) 400 MG capsule USE AS DIRECTED Patient taking differently: Take 1 capsule by mouth daily as needed for pain 10/07/14  Yes Kristian Covey, MD  gabapentin (NEURONTIN) 800 MG tablet TAKE 1 TABLET (800 MG TOTAL) BY MOUTH 4 (FOUR) TIMES DAILY. 02/04/15  Yes Kristian Covey, MD  gabapentin (NEURONTIN) 800 MG tablet Take 800 mg by mouth 4 (four) times daily.   Yes Historical Provider, MD  levothyroxine (SYNTHROID, LEVOTHROID) 50 MCG tablet Take 1 tablet (50 mcg total) by mouth daily. 02/04/15  Yes Kristian Covey, MD  loratadine (CLARITIN) 10 MG tablet Take 10 mg by mouth daily.   Yes Historical Provider, MD  Multiple Vitamins-Minerals (MULTIVITAMIN & MINERAL PO) Take 1 tablet by mouth daily.   Yes Historical Provider, MD  Nutritional Supplements (ADULT GROWTH HORMONE SUPPORT PO) Take 1 capsule by mouth daily.   Yes Historical Provider, MD  oxyCODONE-acetaminophen (PERCOCET/ROXICET) 5-325 MG per tablet 1 and 1/2 as needed daily (45 per month) 03/27/15  Yes Kristian Covey, MD  Probiotic Product (PROBIOTIC COLON  SUPPORT) CAPS Take 1 capsule by mouth daily.   Yes Historical Provider, MD  temazepam (RESTORIL) 15 MG capsule TAKE ONE CAPSULE BY MOUTH AT BEDTIME AS NEEDED Patient taking differently: TAKE ONE CAPSULE BY MOUTH AT BEDTIME 04/15/15  Yes Kristian Covey, MD  traMADol (ULTRAM) 50 MG tablet Take 2 tablets TID prn Patient taking differently: Take 100 mg by mouth 3 (three) times daily.  02/04/15  Yes Kristian Covey, MD  vitamin C (ASCORBIC ACID) 500 MG tablet Take 500 mg by mouth daily.   Yes Historical Provider, MD  VITAMIN D, CHOLECALCIFEROL, PO Take 5,000 Units by mouth daily.   Yes Historical Provider, MD  amphetamine-dextroamphetamine (ADDERALL) 15 MG tablet Take 1 tablet by mouth 2 (two) times daily. Patient not taking: Reported on 05/08/2015 04/16/15   Kristian Covey, MD  amphetamine-dextroamphetamine (ADDERALL) 15 MG tablet Take 1 tablet by mouth 2 (two) times daily. Patient not taking: Reported on 05/08/2015 04/16/15   Kristian Covey, MD  diclofenac sodium (VOLTAREN) 1 % GEL Apply up to 4 times per day as needed for pain. Patient taking differently: Apply 2 g topically 4 (four) times daily as needed (pain).  09/07/12   Kristian Covey, MD  triamcinolone cream (KENALOG) 0.1 % APPLY TO AFFECTED AREA EVERY DAY Patient taking differently: APPLY TO AFFECTED AREA EVERY DAY AS NEEDED 12/16/14   Kristian Covey, MD   BP 136/72 mmHg  Pulse 67  Temp(Src) 98.1 F (36.7 C) (Oral)  Resp 20  SpO2 99% Physical Exam  Constitutional: She is oriented to person, place, and time. She appears well-developed and well-nourished. No distress.  HENT:  Head: Normocephalic and atraumatic.  Right Ear: External ear normal.  Left Ear: External ear normal.  Eyes: Conjunctivae and EOM are normal. Pupils are equal, round, and reactive to light.  Neck: Normal range of motion and phonation normal. Neck supple.  Cardiovascular: Normal rate, regular rhythm and normal heart sounds.   Pulmonary/Chest: Effort normal  and breath sounds normal. She exhibits no bony tenderness.  Abdominal: Soft. She exhibits no distension. There is no tenderness. There is no guarding.  Genitourinary:  Anus, with soft tags consistent with prior hemorrhoids. Normal sphincter tone. Small amount of brown stool in the rectum. No fecal impaction. No rectal mass  Musculoskeletal: Normal range of motion.  Neurological: She is alert and oriented to person, place, and time. No cranial nerve deficit or sensory deficit. She exhibits normal muscle tone. Coordination normal.  Skin: Skin is warm, dry and intact.  Psychiatric: She has a normal mood and affect. Her behavior is normal. Judgment and thought content normal.  Nursing note and vitals reviewed.   ED Course  Procedures (including critical  care time) Medications - No data to display  Patient Vitals for the past 24 hrs:  BP Temp Temp src Pulse Resp SpO2  05/08/15 2149 136/72 mmHg 98.1 F (36.7 C) Oral 67 20 99 %    11:21 PM Reevaluation with update and discussion. After initial assessment and treatment, an updated evaluation reveals she remains comfortable. Findings discussed with the patient. She decided to forego the KUB which was ordered earlier. Maryana Pittmon L    Labs Review Labs Reviewed  BASIC METABOLIC PANEL - Abnormal; Notable for the following:    Glucose, Bld 100 (*)    All other components within normal limits  CBC WITH DIFFERENTIAL/PLATELET  POC OCCULT BLOOD, ED    Imaging Review No results found.   EKG Interpretation None      MDM   Final diagnoses:  Constipation, unspecified constipation type      Constipation, with reported black color stool. No blood on stool testing today. Hemoglobin normal. Vital signs normal. There is no indication for further treatment or hospitalization at this time.  Nursing Notes Reviewed/ Care Coordinated Applicable Imaging Reviewed Interpretation of Laboratory Data incorporated into ED treatment  The patient  appears reasonably screened and/or stabilized for discharge and I doubt any other medical condition or other [facility] requiring further screening, evaluation, or treatment in the ED at this time prior to discharge.  Plan: Home Medications- usual; Home Treatments- rest, fluids, fiber; return here if the recommended treatment, does not improve the symptoms; Recommended follow up- PCP follow-up one week, consider colonoscopy.     Mancel Bale, MD 05/08/15 2322

## 2015-05-09 ENCOUNTER — Other Ambulatory Visit: Payer: Self-pay

## 2015-05-09 ENCOUNTER — Other Ambulatory Visit: Payer: Self-pay | Admitting: Family Medicine

## 2015-05-09 ENCOUNTER — Encounter: Payer: Self-pay | Admitting: Family Medicine

## 2015-05-09 DIAGNOSIS — K5900 Constipation, unspecified: Secondary | ICD-10-CM

## 2015-05-09 MED ORDER — OXYCODONE-ACETAMINOPHEN 5-325 MG PO TABS: ORAL_TABLET | ORAL | Status: DC

## 2015-05-13 ENCOUNTER — Encounter: Payer: Self-pay | Admitting: Physician Assistant

## 2015-05-26 ENCOUNTER — Telehealth: Payer: Self-pay | Admitting: Family Medicine

## 2015-05-26 NOTE — Telephone Encounter (Signed)
° ° ° ° °  Pt request refill of the following: ° °amphetamine-dextroamphetamine (ADDERALL) 15 MG tablet ° ° °Phamacy: °

## 2015-05-26 NOTE — Telephone Encounter (Signed)
Last visit 04/22/15 Last refill 04/16/15 #60 2 refill

## 2015-05-27 NOTE — Telephone Encounter (Signed)
Left detailed message on VM.

## 2015-05-27 NOTE — Telephone Encounter (Signed)
Should have refills- were 3 given in July??

## 2015-06-03 ENCOUNTER — Telehealth: Payer: Self-pay | Admitting: Physician Assistant

## 2015-06-03 ENCOUNTER — Ambulatory Visit: Payer: Federal, State, Local not specified - PPO | Admitting: Physician Assistant

## 2015-06-03 NOTE — Telephone Encounter (Signed)
We will not charge the no show fee.

## 2015-07-01 ENCOUNTER — Ambulatory Visit (INDEPENDENT_AMBULATORY_CARE_PROVIDER_SITE_OTHER): Payer: Federal, State, Local not specified - PPO | Admitting: Physician Assistant

## 2015-07-01 ENCOUNTER — Encounter: Payer: Self-pay | Admitting: Physician Assistant

## 2015-07-01 VITALS — BP 120/70 | HR 70 | Ht 65.0 in | Wt 156.6 lb

## 2015-07-01 DIAGNOSIS — K5909 Other constipation: Secondary | ICD-10-CM

## 2015-07-01 DIAGNOSIS — Z1211 Encounter for screening for malignant neoplasm of colon: Secondary | ICD-10-CM

## 2015-07-01 MED ORDER — NA SULFATE-K SULFATE-MG SULF 17.5-3.13-1.6 GM/177ML PO SOLN: 1.0000 | Freq: Once | ORAL | Status: DC

## 2015-07-01 NOTE — Progress Notes (Signed)
Patient ID: Alexandra Norman, female   DOB: [birth year], 59 y.o.   MRN: [id number]   Subjective:    Patient ID: Alexandra Norman, female    DOB: Jun 04, 1956, 59 y.o.   MRN: [id number]  HPI [name] is a 59 year old white female referred with recent ER visit. Her PCP is Dr. Elease Hashimoto. Patient has relatively new onset of constipation which started about 2-1/2-3 months ago. She has had chronic analgesic use for many years but had not had difficulty with constipation until recently. She said she had a tooth pulled required additional pain medication and antibiotics etc. and became constipated without episode and then has had ongoing problems since. He says she's going in 5-7 days without a bowel movement and then takes a stool softener and Dulcolax which will produce a bowel movement but she is unable to go until she takes additional laxatives. She had an episode in August of very dark stool. She was concerned that this may be blood and went to the emergency room. She was checked there stool was Hemoccult negative and hemoglobin 13.4 hematocrit of 40.7 and MCV of 89.  She continues to strain with constipation. Appetite is been fine she has ongoing issues with fatigue. She also expresses some concern about depression and would like to be referred. No complaints of abdominal pain . Weight has been stable. She has history of ADD, hypothyroidism, status post very remote ASD repair. She also has Charcot-Marie-Tooth disorder with chronic neuropathy. Family history is negative for colon cancer polyps as far she is aware.  Review of Systems Pertinent positive and negative review of systems were noted in the above HPI section.  All other review of systems was otherwise negative.  Outpatient Encounter Prescriptions as of 07/01/2015  Medication Sig  . 5-Hydroxytryptophan (5-HTP PO) Take 200 mg by mouth daily.  Marland Kitchen amphetamine-dextroamphetamine (ADDERALL) 15 MG tablet Take 1 tablet by mouth 2 (two) times daily.  Marland Kitchen  amphetamine-dextroamphetamine (ADDERALL) 15 MG tablet Take 1 tablet by mouth 2 (two) times daily.  . baclofen (LIORESAL) 10 MG tablet TAKE 1 TABLET BY MOUTH 3 TIMES A DAY  . diclofenac sodium (VOLTAREN) 1 % GEL Apply up to 4 times per day as needed for pain. (Patient taking differently: Apply 2 g topically 4 (four) times daily as needed (pain). )  . esomeprazole (NEXIUM) 40 MG capsule Take 1 capsule (40 mg total) by mouth daily before breakfast.  . fluticasone (FLONASE) 50 MCG/ACT nasal spray Place 2 sprays into both nostrils daily.  Marland Kitchen gabapentin (NEURONTIN) 400 MG capsule USE AS DIRECTED (Patient taking differently: Take 1 capsule by mouth daily as needed for pain)  . gabapentin (NEURONTIN) 800 MG tablet TAKE 1 TABLET (800 MG TOTAL) BY MOUTH 4 (FOUR) TIMES DAILY.  Marland Kitchen levothyroxine (SYNTHROID, LEVOTHROID) 50 MCG tablet Take 1 tablet (50 mcg total) by mouth daily.  Marland Kitchen loratadine (CLARITIN) 10 MG tablet Take 10 mg by mouth daily.  . Multiple Vitamins-Minerals (MULTIVITAMIN & MINERAL PO) Take 1 tablet by mouth daily.  . Nutritional Supplements (ADULT GROWTH HORMONE SUPPORT PO) Take 1 capsule by mouth daily.  Marland Kitchen oxyCODONE-acetaminophen (PERCOCET/ROXICET) 5-325 MG per tablet 1 and 1/2 as needed daily (45 per month)  . Probiotic Product (PROBIOTIC COLON SUPPORT) CAPS Take 1 capsule by mouth daily.  . temazepam (RESTORIL) 15 MG capsule TAKE ONE CAPSULE BY MOUTH AT BEDTIME AS NEEDED (Patient taking differently: TAKE ONE CAPSULE BY MOUTH AT BEDTIME)  . traMADol (ULTRAM) 50 MG tablet Take 2 tablets TID prn (Patient taking differently:  Take 100 mg by mouth 3 (three) times daily. )  . triamcinolone cream (KENALOG) 0.1 % APPLY TO AFFECTED AREA EVERY DAY (Patient taking differently: APPLY TO AFFECTED AREA EVERY DAY AS NEEDED)  . vitamin C (ASCORBIC ACID) 500 MG tablet Take 500 mg by mouth daily.  Marland Kitchen VITAMIN D, CHOLECALCIFEROL, PO Take 5,000 Units by mouth daily.  . Na Sulfate-K Sulfate-Mg Sulf SOLN Take 1 kit by  mouth once.  . [DISCONTINUED] amphetamine-dextroamphetamine (ADDERALL) 15 MG tablet Take 1 tablet by mouth 2 (two) times daily. (Patient not taking: Reported on 05/08/2015)  . [DISCONTINUED] gabapentin (NEURONTIN) 800 MG tablet Take 800 mg by mouth 4 (four) times daily.   No facility-administered encounter medications on file as of 07/01/2015.   Allergies  Allergen Reactions  . Penicillins     hives  . Statins    Patient Active Problem List   Diagnosis Date Noted  . Constipation 04/29/2015  . Encounter to establish care 04/29/2015  . Peripheral neuropathy 09/07/2012  . Charcot-Marie-Tooth disease 01/31/2012  . Hypothyroid 01/31/2012  . Atrial septal defect 01/31/2012  . Hyperlipidemia 01/31/2012  . GERD (gastroesophageal reflux disease) 01/31/2012  . Osteoarthritis 01/31/2012  . Osteoporosis 01/31/2012  . Depression 01/31/2012  . ADD (attention deficit disorder) 01/31/2012  . Fibromyalgia 01/31/2012  . Allergic rhinitis 01/31/2012   Social History   Social History  . Marital Status: Divorced    Spouse Name: N/A  . Number of Children: 2  . Years of Education: N/A   Occupational History  . [employer]    Social History Main Topics  . Smoking status: Never Smoker   . Smokeless tobacco: Never Used  . Alcohol Use: No  . Drug Use: No  . Sexual Activity: Not on file   Other Topics Concern  . Not on file   Social History Narrative   Works for the [employer], has been there 25 years   Not married   Has a son (55) daughter (7). Has two grandchildren     Ms. Wnuk's family history includes Alcohol abuse in her father and mother; Arthritis in her father and mother; Breast cancer in her sister; Mental illness in her mother and sister.      Objective:    Filed Vitals:   07/01/15 1018  BP: 120/70  Pulse: 70    Physical Exam well-developed white female in no acute distress, blood pressure 120/70 pulse 70 height 5 foot 5 weight 156. HEENT; nontraumatic normocephalic  EOMI PERRLA sclera anicteric, Supple ;no JVD, Cardiovascular; regular rate and rhythm with Z6-S0 , soft systolic murmur pulmonary clear bilaterally, Abdomen; soft bowel sounds are present there is no palpable mass or hepatosplenomegaly she is an upper midline incisional scar, Rectal; exam not repeated this was Hemoccult negative in the ER last month, Extremities; no clubbing cyanosis or edema she does have right-sided weakness, Neuropsych ;mood and affect appropriate       Assessment & Plan:   #1 59 yo female with new onset constipation- likely functional and exacerbated by analgesics R/O occult lesion  #2 ADD #3 Charcot-Marie Tooth disorder-chronic neuropathic pain  Plan; start MiraLAX 17 g in 8 ounces of water once daily, to be used regularly Will schedule for colonoscopy with Dr. Ardis Hughs. Procedure discussed in detail with patient and she is agreeable to proceed      Alfredia Ferguson PA-C 07/01/2015   Cc: Eulas Post, MD

## 2015-07-01 NOTE — Progress Notes (Signed)
i agree with the above note, plan 

## 2015-07-01 NOTE — Patient Instructions (Addendum)
You have been scheduled for a colonoscopy. Please follow written instructions given to you at your visit today.  Please pick up your prep supplies at the pharmacy within the next 1-3 days. [address] If you use inhalers (even only as needed), please bring them with you on the day of your procedure. Your physician has requested that you go to www.startemmi.com and enter the access code given to you at your visit today. This web site gives a general overview about your procedure. However, you should still follow specific instructions given to you by our office regarding your preparation for the procedure.  Start Miralax 17 grams in 8 oz of water every day. Try a probiotic: Align, Culturelle, Mathews Robinsons colon health.

## 2015-07-18 ENCOUNTER — Telehealth: Payer: Self-pay | Admitting: Family Medicine

## 2015-07-18 NOTE — Telephone Encounter (Signed)
Pt request refill oxyCODONE-acetaminophen (PERCOCET/ROXICET) 5-325 MG per tablet Please leave message when ready, pt works at night and sleeps during the day.

## 2015-07-18 NOTE — Telephone Encounter (Signed)
No narcotics from this office on EPIC record. Please contact PCP.

## 2015-07-20 NOTE — Telephone Encounter (Signed)
Refill OK

## 2015-07-21 MED ORDER — OXYCODONE-ACETAMINOPHEN 5-325 MG PO TABS: ORAL_TABLET | ORAL | Status: DC

## 2015-07-21 NOTE — Telephone Encounter (Signed)
Rx is ready for pick up. Left message for pt to pick it up

## 2015-07-21 NOTE — Addendum Note (Signed)
Addended by: Griselda MinerJIMENEZ, Elfida Shimada E on: 07/21/2015 12:06 PM   Modules accepted: Orders

## 2015-07-28 ENCOUNTER — Other Ambulatory Visit: Payer: Self-pay | Admitting: Family Medicine

## 2015-07-29 ENCOUNTER — Ambulatory Visit (AMBULATORY_SURGERY_CENTER): Payer: Federal, State, Local not specified - PPO | Admitting: Gastroenterology

## 2015-07-29 ENCOUNTER — Encounter: Payer: Self-pay | Admitting: Gastroenterology

## 2015-07-29 VITALS — BP 136/64 | HR 74 | Temp 97.2°F | Resp 18 | Ht 65.0 in | Wt 156.0 lb

## 2015-07-29 DIAGNOSIS — Z1211 Encounter for screening for malignant neoplasm of colon: Secondary | ICD-10-CM

## 2015-07-29 DIAGNOSIS — K5909 Other constipation: Secondary | ICD-10-CM

## 2015-07-29 MED ORDER — SODIUM CHLORIDE 0.9 % IV SOLN: 500.0000 mL | INTRAVENOUS | Status: DC

## 2015-07-29 NOTE — Op Note (Signed)
[facility] [address] [place], [id number]   COLONOSCOPY PROCEDURE REPORT  PATIENT: Alexandra BeanFisher, Alexandra Norman  MR#: [id number] BIRTHDATE: 04/28/1956 , 59  yrs. old GENDER: female ENDOSCOPIST: Rachael Feeaniel P Estle Sabella, MD REFERRED JY:NWGNFBY:Bruce Caryl NeverBurchette, M.D. PROCEDURE DATE:  07/29/2015 PROCEDURE:   Colonoscopy, screening First Screening Colonoscopy - Avg.  risk and is 50 yrs.  old or older Yes.  Prior Negative Screening - Now for repeat screening. N/A  History of Adenoma - Now for follow-up colonoscopy & has been > or = to 3 yrs.  N/A  Recommend repeat exam, <10 yrs? No ASA CLASS:   Class II INDICATIONS:Screening for colonic neoplasia, Colorectal Neoplasm Risk Assessment for this procedure is average risk, and minor constipation, improved. MEDICATIONS: Monitored anesthesia care and Propofol 250 mg IV  DESCRIPTION OF PROCEDURE:   After the risks benefits and alternatives of the procedure were thoroughly explained, informed consent was obtained.  The digital rectal exam revealed no abnormalities of the rectum.   The LB [id number]-[id number]  endoscope was introduced through the anus and advanced to the cecum, which was identified by both the appendix and ileocecal valve. No adverse events experienced.   The quality of the prep was excellent.  The instrument was then slowly withdrawn as the colon was fully examined. Estimated blood loss is zero unless otherwise noted in this procedure report.   COLON FINDINGS: There was mild diverticulosis noted in the left colon.   Moderate sized external and internal hemorrhoids were found.   The examination was otherwise normal.  Retroflexed views revealed no abnormalities. The time to cecum = 3.3 Withdrawal time = 11.3   The scope was withdrawn and the procedure completed. COMPLICATIONS: There were no immediate complications.  ENDOSCOPIC IMPRESSION: 1.   Mild diverticulosis was noted in the left colon 2.   Moderate sized external and internal  hemorrhoids 3.   The examination was otherwise normal  RECOMMENDATIONS: You should continue to follow colorectal cancer screening guidelines for "routine risk" patients with a repeat colonoscopy in 10 years.   eSigned:  Rachael Feeaniel P Elanda Garmany, MD 07/29/2015 8:53 AM

## 2015-07-29 NOTE — Patient Instructions (Signed)
YOU HAD AN ENDOSCOPIC PROCEDURE TODAY AT THE [facility]:   Refer to the procedure report that was given to you for any specific questions about what was found during the examination.  If the procedure report does not answer your questions, please call your gastroenterologist to clarify.  If you requested that your care partner not be given the details of your procedure findings, then the procedure report has been included in a sealed envelope for you to review at your convenience later.  YOU SHOULD EXPECT: Some feelings of bloating in the abdomen. Passage of more gas than usual.  Walking can help get rid of the air that was put into your GI tract during the procedure and reduce the bloating. If you had a lower endoscopy (such as a colonoscopy or flexible sigmoidoscopy) you may notice spotting of blood in your stool or on the toilet paper. If you underwent a bowel prep for your procedure, you may not have a normal bowel movement for a few days.  Please Note:  You might notice some irritation and congestion in your nose or some drainage.  This is from the oxygen used during your procedure.  There is no need for concern and it should clear up in a day or so.  SYMPTOMS TO REPORT IMMEDIATELY:   Following lower endoscopy (colonoscopy or flexible sigmoidoscopy):  Excessive amounts of blood in the stool  Significant tenderness or worsening of abdominal pains  Swelling of the abdomen that is new, acute  Fever of 100F or higher  For urgent or emergent issues, a gastroenterologist can be reached at any hour by calling [phone].   DIET: Your first meal following the procedure should be a small meal and then it is ok to progress to your normal diet. Heavy or fried foods are harder to digest and may make you feel nauseous or bloated.  Likewise, meals heavy in dairy and vegetables can increase bloating.  Drink plenty of fluids but you should avoid alcoholic beverages for 24  hours.  ACTIVITY:  You should plan to take it easy for the rest of today and you should NOT DRIVE or use heavy machinery until tomorrow (because of the sedation medicines used during the test).    FOLLOW UP: Our staff will call the number listed on your records the next business day following your procedure to check on you and address any questions or concerns that you may have regarding the information given to you following your procedure. If we do not reach you, we will leave a message.  However, if you are feeling well and you are not experiencing any problems, there is no need to return our call.  We will assume that you have returned to your regular daily activities without incident.  If any biopsies were taken you will be contacted by phone or by letter within the next 1-3 weeks.  Please call us at [phone] if you have not heard about the biopsies in 3 weeks.    SIGNATURES/CONFIDENTIALITY: You and/or your care partner have signed paperwork which will be entered into your electronic medical record.  These signatures attest to the fact that that the information above on your After Visit Summary has been reviewed and is understood.  Full responsibility of the confidentiality of this discharge information lies with you and/or your care-partner.  Diverticulosis, high fiber diet, hemorrhoid information given.  Next colonoscopy 10 years-2026.

## 2015-07-29 NOTE — Progress Notes (Signed)
Transferred to recovery room. A/O x3, pleased with MAC.  VSS.  Report to Jane, RN. 

## 2015-07-30 ENCOUNTER — Telehealth: Payer: Self-pay | Admitting: *Deleted

## 2015-07-30 NOTE — Telephone Encounter (Signed)
No answer, left message to call office if question or concerms;

## 2015-08-31 ENCOUNTER — Other Ambulatory Visit: Payer: Self-pay | Admitting: Family Medicine

## 2015-09-02 ENCOUNTER — Telehealth: Payer: Self-pay | Admitting: Family Medicine

## 2015-09-02 NOTE — Telephone Encounter (Signed)
Pt request refill of the following: Testosterone (VOGELXO PUMP) 12.5 MG/ACT (1%) GEL   Phamacy:

## 2015-09-02 NOTE — Telephone Encounter (Signed)
[facility]  Last refill was 02/04/2015 #180 5 rf No pending appts Please advise

## 2015-09-03 NOTE — Telephone Encounter (Signed)
Is this the correct medication? I don't see this on her current or past medication list.

## 2015-09-03 NOTE — Telephone Encounter (Signed)
Refill OK

## 2015-09-04 ENCOUNTER — Telehealth: Payer: Self-pay | Admitting: Family Medicine

## 2015-09-04 NOTE — Telephone Encounter (Signed)
Pt request refill of the following:  amphetamine-dextroamphetamine (ADDERALL) 15 MG tablet  [name] this is the one I sent you wrong med on. If possible she will need to pick this up Friday 09/05/15   Phamacy:

## 2015-09-05 NOTE — Telephone Encounter (Signed)
Noted. [facility] but got upset during exam due to connecting constipation symptoms with Adderall. Patient is now asking Dr. Caryl NeverBurchette to refill Adderall. Please advise if approve refill or warrants appointment?   Last seen (Dr. Caryl NeverBurchette): 04/22/15 Last refill: 04/16/2015 #60 (takes BID) no refills

## 2015-09-08 ENCOUNTER — Other Ambulatory Visit: Payer: Self-pay | Admitting: *Deleted

## 2015-09-08 MED ORDER — AMPHETAMINE-DEXTROAMPHETAMINE 15 MG PO TABS: 15.0000 mg | ORAL_TABLET | Freq: Two times a day (BID) | ORAL | Status: DC

## 2015-09-08 NOTE — Telephone Encounter (Signed)
Refill OK

## 2015-09-08 NOTE — Telephone Encounter (Signed)
Rx printed. Left message for patient to call back to make aware Rx is ready.

## 2015-09-08 NOTE — Telephone Encounter (Signed)
Rx printed. Left message for patient to call back to make aware Rx is ready to be picked up.

## 2015-09-08 NOTE — Telephone Encounter (Signed)
Patient is aware is Rx is ready

## 2015-09-22 ENCOUNTER — Telehealth: Payer: Self-pay | Admitting: Family Medicine

## 2015-09-22 NOTE — Telephone Encounter (Signed)
Ms. Alexandra Norman called saying she needs a refill of Percocet. She has two pills left. If you need to contact her, please do so.  Pt's ph# [phone] Thank you.

## 2015-09-22 NOTE — Telephone Encounter (Signed)
I see back on 04/29/2015 pt est care w

## 2015-09-23 NOTE — Telephone Encounter (Signed)
Ignore previous annotation from me. I see where pt saw [facility] earlier this year and walked out on her visit. She has not been seen by you since 04/22/2015. Please advise.

## 2015-09-23 NOTE — Telephone Encounter (Signed)
Refill OK

## 2015-09-24 MED ORDER — OXYCODONE-ACETAMINOPHEN 5-325 MG PO TABS: ORAL_TABLET | ORAL | Status: DC

## 2015-09-24 NOTE — Telephone Encounter (Signed)
Printed for signature

## 2015-09-24 NOTE — Telephone Encounter (Signed)
Pt is aware via voicemail that RX is up front for pick up. 

## 2015-10-14 ENCOUNTER — Telehealth: Payer: Self-pay | Admitting: Family Medicine

## 2015-10-14 NOTE — Telephone Encounter (Signed)
Patient last seen 04/29/15 by [facility] Keenory to est. Care - no upcoming appts. Ok to refill?

## 2015-10-14 NOTE — Telephone Encounter (Signed)
Pt needs new rx generic adderalll 15 mg

## 2015-10-14 NOTE — Telephone Encounter (Signed)
Refill OK.  Needs to schedule follow up within next 3 months.

## 2015-10-15 MED ORDER — AMPHETAMINE-DEXTROAMPHETAMINE 15 MG PO TABS: 15.0000 mg | ORAL_TABLET | Freq: Two times a day (BID) | ORAL | Status: DC

## 2015-10-15 NOTE — Telephone Encounter (Signed)
Printed for signature

## 2015-10-15 NOTE — Telephone Encounter (Signed)
Rx signed. Pt aware via voicemail due for a follow-up appt. Up front for pick up.

## 2015-10-15 NOTE — Telephone Encounter (Signed)
At [facility]. Will route to Dr. Lucie LeatherBurchette's CMA for further follow up. Thanks!

## 2015-10-22 ENCOUNTER — Other Ambulatory Visit: Payer: Self-pay | Admitting: Family Medicine

## 2015-10-23 NOTE — Telephone Encounter (Signed)
Refill once 

## 2015-10-23 NOTE — Telephone Encounter (Signed)
Medication requesting refill on: TRAMADOL HCL 50 MG TABLET    #180 - 0 refills  Last seen: 04/22/15 Last refill: 09/03/15 #180 - 0 refills  Patient has appointment 11/19/15 scheduled for medication follow-up

## 2015-11-11 ENCOUNTER — Other Ambulatory Visit: Payer: Self-pay | Admitting: Family Medicine

## 2015-11-19 ENCOUNTER — Ambulatory Visit: Payer: Federal, State, Local not specified - PPO | Admitting: Family Medicine

## 2015-11-24 ENCOUNTER — Encounter: Payer: Self-pay | Admitting: Family Medicine

## 2015-11-24 ENCOUNTER — Ambulatory Visit (INDEPENDENT_AMBULATORY_CARE_PROVIDER_SITE_OTHER): Payer: Federal, State, Local not specified - PPO | Admitting: Family Medicine

## 2015-11-24 VITALS — BP 140/80 | HR 80 | Temp 99.0°F | Wt 166.0 lb

## 2015-11-24 DIAGNOSIS — G6289 Other specified polyneuropathies: Secondary | ICD-10-CM | POA: Diagnosis not present

## 2015-11-24 DIAGNOSIS — Z1159 Encounter for screening for other viral diseases: Secondary | ICD-10-CM

## 2015-11-24 DIAGNOSIS — F32A Depression, unspecified: Secondary | ICD-10-CM

## 2015-11-24 DIAGNOSIS — Z23 Encounter for immunization: Secondary | ICD-10-CM | POA: Diagnosis not present

## 2015-11-24 DIAGNOSIS — E039 Hypothyroidism, unspecified: Secondary | ICD-10-CM | POA: Diagnosis not present

## 2015-11-24 DIAGNOSIS — F988 Other specified behavioral and emotional disorders with onset usually occurring in childhood and adolescence: Secondary | ICD-10-CM

## 2015-11-24 DIAGNOSIS — F909 Attention-deficit hyperactivity disorder, unspecified type: Secondary | ICD-10-CM | POA: Diagnosis not present

## 2015-11-24 DIAGNOSIS — F329 Major depressive disorder, single episode, unspecified: Secondary | ICD-10-CM

## 2015-11-24 MED ORDER — AMPHETAMINE-DEXTROAMPHETAMINE 15 MG PO TABS: 15.0000 mg | ORAL_TABLET | Freq: Two times a day (BID) | ORAL | Status: DC

## 2015-11-24 MED ORDER — BACLOFEN 10 MG PO TABS: 10.0000 mg | ORAL_TABLET | Freq: Three times a day (TID) | ORAL | Status: DC

## 2015-11-24 MED ORDER — DICLOFENAC SODIUM 1 % TD GEL: TRANSDERMAL | Status: DC

## 2015-11-24 MED ORDER — DULOXETINE HCL 30 MG PO CPEP: 30.0000 mg | ORAL_CAPSULE | Freq: Every day | ORAL | Status: DC

## 2015-11-24 MED ORDER — OXYCODONE-ACETAMINOPHEN 5-325 MG PO TABS: ORAL_TABLET | ORAL | Status: DC

## 2015-11-24 MED ORDER — GABAPENTIN 400 MG PO CAPS: 400.0000 mg | ORAL_CAPSULE | Freq: Three times a day (TID) | ORAL | Status: DC

## 2015-11-24 NOTE — Progress Notes (Signed)
Subjective:    Patient ID: Alexandra Norman, female    DOB: Jul 25, 1956, 60 y.o.   MRN: [id number]  HPI Patient has multiple chronic medical problems including attention deficit disorder, Charcot-Marie-Tooth disease, recurrent depression, fibromyalgia, GERD, hyperlipidemia, hypothyroidism. Seen today to discuss multiple issues as follows  Hypothyroidism. Patient had been taking levothyroxin 50 g daily and a couple months ago she took her self off this is started some type of over-the-counter supplement. She states that she fills "better "since taking the supplement. She does have some chronic fatigue which is unchanged. No cold intolerance. No recent constipation issues.  Charcot-Marie-Tooth disease with chronic neuropathy pain. Takes high-dose gabapentin. Recently had worsening pain even with this. She supplements with low-dose oxycodone which she has been on for years.  History of recurrent depression. Very strong family history. Both her mother and sister committed suicide. She has occasionally low motivation and depressed mood but denies any suicidal ideation. We discussed possible use of Cymbalta given her chronic neuropathy pains which are poorly controlled with gabapentin.  Attention deficit disorder. She's been on Adderall for several years. No history of misuse.  Past Medical History  Diagnosis Date  . Arthritis   . Depression   . Headache(784.0)   . Ulcer   . Allergy   . Arrhythmia   . Heart murmur   . Hyperlipidemia   . Thyroid disease   . UTI (urinary tract infection)   . ADD (attention deficit disorder)   . Charcot-Marie-Tooth disease   . Fibromyalgia   . Osteoarthritis   . Osteoporosis   . Rotator cuff tear    Past Surgical History  Procedure Laterality Date  . Cardiac surgery  1982    ASD repair   . Carpal tunnel release  2007    bilateral  . Foot surgery  2001 and 2005    bilateral    reports that she has never smoked. She has never used smokeless  tobacco. She reports that she does not drink alcohol or use illicit drugs. family history includes Alcohol abuse in her father and mother; Arthritis in her father and mother; Breast cancer in her sister; Mental illness in her mother and sister. Allergies  Allergen Reactions  . Penicillins     hives  . Statins       Review of Systems  Constitutional: Positive for fatigue.  Eyes: Negative for visual disturbance.  Respiratory: Negative for cough, chest tightness, shortness of breath and wheezing.   Cardiovascular: Negative for chest pain, palpitations and leg swelling.  Gastrointestinal: Negative for abdominal pain.  Endocrine: Negative for polydipsia and polyuria.  Genitourinary: Negative for dysuria.  Musculoskeletal: Positive for myalgias.  Neurological: Negative for dizziness, seizures, syncope, weakness, light-headedness and headaches.  Psychiatric/Behavioral: Positive for sleep disturbance and dysphoric mood. Negative for suicidal ideas.       Objective:   Physical Exam  Constitutional: She appears well-developed and well-nourished.  Neck: Neck supple. No thyromegaly present.  Cardiovascular: Normal rate and regular rhythm.   Pulmonary/Chest: Effort normal and breath sounds normal. No respiratory distress. She has no wheezes. She has no rales.  Musculoskeletal: She exhibits no edema.  Psychiatric: She has a normal mood and affect.          Assessment & Plan:  #1 history of hypothyroidism. Patient took her self off levothyroxin 2 months ago. Recheck TSH. Will need to go back on replacement if TSH is climbing  #2 chronic neuropathic pain related to Charcot-Marie-Tooth disease. We discussed consideration for starting  Cymbalta 30 mg daily for 2 weeks and then titrate up to 60 mg daily. Reassess and one month. Continue Neurontin  #3 history of recurrent depression. Trial of Cymbalta as above. She also had questions regarding possible Wellbutrin because of her attention  deficit. If she does not tolerate the Cymbalta or get relief with her neuropathy pain would consider trial of Wellbutrin  #4 health maintenance. Flu vaccine given  #5 attention deficit disorder. Refill Adderall for 3 months

## 2015-11-24 NOTE — Patient Instructions (Signed)
Take Cymbalta 30 mg one daily for two weeks and then titrate up to two daily

## 2015-11-24 NOTE — Progress Notes (Signed)
Pre visit review using our clinic review tool, if applicable. No additional management support is needed unless otherwise documented below in the visit note. 

## 2015-11-25 ENCOUNTER — Encounter: Payer: Self-pay | Admitting: Family Medicine

## 2015-11-25 LAB — BASIC METABOLIC PANEL
BUN: 14 mg/dL (ref 6–23)
CO2: 30 mEq/L (ref 19–32)
Calcium: 9.5 mg/dL (ref 8.4–10.5)
Chloride: 107 mEq/L (ref 96–112)
Creatinine, Ser: 0.58 mg/dL (ref 0.40–1.20)
GFR: 112.67 mL/min (ref >60.00)
Glucose, Bld: 100 mg/dL — ABNORMAL HIGH (ref 70–99)
Potassium: 5.1 mEq/L (ref 3.5–5.1)
Sodium: 143 mEq/L (ref 135–145)

## 2015-11-25 LAB — TSH: TSH: 1.29 u[IU]/mL (ref 0.35–4.50)

## 2015-11-25 LAB — HEPATITIS C ANTIBODY: HCV Ab: NEGATIVE

## 2015-11-26 ENCOUNTER — Encounter: Payer: Self-pay | Admitting: Family Medicine

## 2015-12-08 ENCOUNTER — Other Ambulatory Visit: Payer: Self-pay | Admitting: Family Medicine

## 2015-12-09 NOTE — Telephone Encounter (Signed)
Pt last visit 11/24/15 Pt last refill 10/24/15 #180 with no refills

## 2015-12-10 NOTE — Telephone Encounter (Signed)
Refill OK

## 2015-12-11 ENCOUNTER — Other Ambulatory Visit: Payer: Self-pay | Admitting: Family Medicine

## 2015-12-30 ENCOUNTER — Encounter: Payer: Self-pay | Admitting: Family Medicine

## 2015-12-31 ENCOUNTER — Other Ambulatory Visit: Payer: Self-pay | Admitting: Family Medicine

## 2015-12-31 MED ORDER — DULOXETINE HCL 30 MG PO CPEP: 30.0000 mg | ORAL_CAPSULE | Freq: Every day | ORAL | Status: DC

## 2015-12-31 NOTE — Telephone Encounter (Signed)
Medication sent in. 

## 2015-12-31 NOTE — Telephone Encounter (Signed)
FYI. Recent increaese dose of medication.

## 2016-01-14 ENCOUNTER — Telehealth: Payer: Self-pay | Admitting: Family Medicine

## 2016-01-14 MED ORDER — DULOXETINE HCL 30 MG PO CPEP: 30.0000 mg | ORAL_CAPSULE | Freq: Every day | ORAL | Status: DC

## 2016-01-14 NOTE — Telephone Encounter (Signed)
Medication sent in for patient. 

## 2016-01-14 NOTE — Telephone Encounter (Signed)
Pt is now taking two cymbalta 30 mg. Pt needs new rx cymbalta 30 mg #60 w/refills.[facility] [address]

## 2016-01-15 ENCOUNTER — Other Ambulatory Visit: Payer: Self-pay | Admitting: Family Medicine

## 2016-01-15 MED ORDER — DULOXETINE HCL 30 MG PO CPEP: ORAL_CAPSULE | ORAL | Status: DC

## 2016-01-16 ENCOUNTER — Other Ambulatory Visit: Payer: Self-pay | Admitting: Family Medicine

## 2016-01-23 ENCOUNTER — Other Ambulatory Visit: Payer: Self-pay | Admitting: Family Medicine

## 2016-01-23 NOTE — Telephone Encounter (Signed)
Last seen on 11/24/2015 Last refill 12/12/2015 #180, 0rf No pending appt

## 2016-01-23 NOTE — Telephone Encounter (Signed)
Refill OK

## 2016-02-09 ENCOUNTER — Other Ambulatory Visit: Payer: Self-pay | Admitting: Family Medicine

## 2016-02-09 NOTE — Telephone Encounter (Signed)
Refill OK

## 2016-02-09 NOTE — Telephone Encounter (Signed)
Last refill 11/24/2015 #45, 0rf Last OV was 11/24/2015

## 2016-02-10 MED ORDER — OXYCODONE-ACETAMINOPHEN 5-325 MG PO TABS: ORAL_TABLET | ORAL | Status: DC

## 2016-02-23 ENCOUNTER — Other Ambulatory Visit: Payer: Self-pay | Admitting: Family Medicine

## 2016-03-09 ENCOUNTER — Ambulatory Visit (INDEPENDENT_AMBULATORY_CARE_PROVIDER_SITE_OTHER): Payer: Federal, State, Local not specified - PPO | Admitting: Family Medicine

## 2016-03-09 VITALS — BP 150/90 | HR 77 | Temp 98.2°F | Ht 65.0 in | Wt 176.0 lb

## 2016-03-09 DIAGNOSIS — H6121 Impacted cerumen, right ear: Secondary | ICD-10-CM

## 2016-03-09 NOTE — Progress Notes (Signed)
Pre visit review using our clinic review tool, if applicable. No additional management support is needed unless otherwise documented below in the visit note. 

## 2016-03-09 NOTE — Progress Notes (Signed)
   Subjective:    Patient ID: Alexandra BeanCatherine Mcgahan, female    DOB: 07-26-1956, 60 y.o.   MRN: [id number]  HPI  Acute visit for right ear pain. She states this feels "clogged up " She's had some mild associated pain. She used a Q-tip to try to get this cleared out. She's had some decreased hearing in the right ear. No left ear symptoms. No vertigo. No ringing.  Past Medical History  Diagnosis Date  . Arthritis   . Depression   . Headache(784.0)   . Ulcer   . Allergy   . Arrhythmia   . Heart murmur   . Hyperlipidemia   . Thyroid disease   . UTI (urinary tract infection)   . ADD (attention deficit disorder)   . Charcot-Marie-Tooth disease   . Fibromyalgia   . Osteoarthritis   . Osteoporosis   . Rotator cuff tear    Past Surgical History  Procedure Laterality Date  . Cardiac surgery  1982    ASD repair   . Carpal tunnel release  2007    bilateral  . Foot surgery  2001 and 2005    bilateral    reports that she has never smoked. She has never used smokeless tobacco. She reports that she does not drink alcohol or use illicit drugs. family history includes Alcohol abuse in her father and mother; Arthritis in her father and mother; Breast cancer in her sister; Mental illness in her mother and sister. Allergies  Allergen Reactions  . Penicillins     hives  . Statins      Review of Systems  Constitutional: Negative for fever and chills.  HENT: Positive for ear pain and hearing loss (Right ear only). Negative for congestion and ear discharge.   Respiratory: Negative for cough.        Objective:   Physical Exam  Constitutional: She appears well-developed and well-nourished.  HENT:  Left Ear: External ear normal.  Mouth/Throat: Oropharynx is clear and moist.  Right canal reveals some bruising along the inferior portion of the canal but no active bleeding. She has cerumen which is filling the canal and eardrum is not visualized.          Assessment & Plan:  Cerumen  impaction right canal. She does have some bruising and the inferior portion of the right canal which is probably related to recent Q-tip use. Bruising was noted prior to any irrigation of the canal. After irrigation, cerumen fully removed and TM normal.  Pt able to hear much better and ear pain resolved.  Kristian CoveyBruce W Yamilett Anastos MD [facility]

## 2016-03-14 ENCOUNTER — Other Ambulatory Visit: Payer: Self-pay | Admitting: Family Medicine

## 2016-03-15 NOTE — Telephone Encounter (Signed)
Refill with 5 additional refills. 

## 2016-03-15 NOTE — Telephone Encounter (Signed)
Last filled on 01/23/16 #180, last OV 0/06/17. Ok to refill?

## 2016-04-12 ENCOUNTER — Encounter (HOSPITAL_BASED_OUTPATIENT_CLINIC_OR_DEPARTMENT_OTHER): Payer: Self-pay

## 2016-04-12 ENCOUNTER — Emergency Department (HOSPITAL_BASED_OUTPATIENT_CLINIC_OR_DEPARTMENT_OTHER)
Admission: EM | Admit: 2016-04-12 | Discharge: 2016-04-12 | Disposition: A | Discharge status: Home / Self Care | Attending: Emergency Medicine | Admitting: Emergency Medicine

## 2016-04-12 ENCOUNTER — Other Ambulatory Visit: Payer: Self-pay | Admitting: Family Medicine

## 2016-04-12 ENCOUNTER — Emergency Department (HOSPITAL_BASED_OUTPATIENT_CLINIC_OR_DEPARTMENT_OTHER)

## 2016-04-12 DIAGNOSIS — Y9389 Activity, other specified: Secondary | ICD-10-CM | POA: Diagnosis not present

## 2016-04-12 DIAGNOSIS — S93401A Sprain of unspecified ligament of right ankle, initial encounter: Secondary | ICD-10-CM | POA: Diagnosis not present

## 2016-04-12 DIAGNOSIS — M1990 Unspecified osteoarthritis, unspecified site: Secondary | ICD-10-CM | POA: Insufficient documentation

## 2016-04-12 DIAGNOSIS — Y990 Civilian activity done for income or pay: Secondary | ICD-10-CM | POA: Insufficient documentation

## 2016-04-12 DIAGNOSIS — F909 Attention-deficit hyperactivity disorder, unspecified type: Secondary | ICD-10-CM | POA: Diagnosis not present

## 2016-04-12 DIAGNOSIS — Y929 Unspecified place or not applicable: Secondary | ICD-10-CM | POA: Insufficient documentation

## 2016-04-12 DIAGNOSIS — S52502A Unspecified fracture of the lower end of left radius, initial encounter for closed fracture: Secondary | ICD-10-CM

## 2016-04-12 DIAGNOSIS — F329 Major depressive disorder, single episode, unspecified: Secondary | ICD-10-CM | POA: Diagnosis not present

## 2016-04-12 DIAGNOSIS — W010XXA Fall on same level from slipping, tripping and stumbling without subsequent striking against object, initial encounter: Secondary | ICD-10-CM | POA: Diagnosis not present

## 2016-04-12 DIAGNOSIS — E785 Hyperlipidemia, unspecified: Secondary | ICD-10-CM | POA: Diagnosis not present

## 2016-04-12 DIAGNOSIS — S59292A Other physeal fracture of lower end of radius, left arm, initial encounter for closed fracture: Secondary | ICD-10-CM | POA: Insufficient documentation

## 2016-04-12 DIAGNOSIS — M25571 Pain in right ankle and joints of right foot: Secondary | ICD-10-CM | POA: Diagnosis present

## 2016-04-12 NOTE — ED Notes (Signed)
Patient transported to X-ray 

## 2016-04-12 NOTE — ED Provider Notes (Signed)
CSN: [id number]     Arrival date & time 04/12/16  1953 History  By signing my name below, I, Bridgette HabermannMaria Tan, attest that this documentation has been prepared under the direction and in the presence of Loren Raceravid Selene Peltzer, MD. Electronically Signed: Bridgette HabermannMaria Tan, ED Scribe. 04/12/2016. 8:36 PM.   Chief Complaint  Patient presents with  . Fall   The history is provided by the patient. No language interpreter was used.    HPI Comments: Alexandra Norman is a 60 y.o. female who presents to the Emergency Department complaining of constant, left wrist and right ankle pain s/p mechanical fall today. Pt reports she was at work when she slipped on a zip-tie and landed on her left wrist and turned her right ankle. No LOC. Pt denies any head injury. Pt denies additional injuries. No weakness or numbness.  Past Medical History  Diagnosis Date  . Arthritis   . Depression   . Headache(784.0)   . Ulcer   . Allergy   . Arrhythmia   . Heart murmur   . Hyperlipidemia   . Thyroid disease   . UTI (urinary tract infection)   . ADD (attention deficit disorder)   . Charcot-Marie-Tooth disease   . Fibromyalgia   . Osteoarthritis   . Osteoporosis   . Rotator cuff tear    Past Surgical History  Procedure Laterality Date  . Cardiac surgery  1982    ASD repair   . Carpal tunnel release  2007    bilateral  . Foot surgery  2001 and 2005    bilateral   Family History  Problem Relation Age of Onset  . Alcohol abuse Mother   . Arthritis Mother   . Mental illness Mother     Suicide in [id number]  . Alcohol abuse Father   . Arthritis Father   . Mental illness Sister     Suicide in [id number]  . Breast cancer Sister    Social History  Substance Use Topics  . Smoking status: Never Smoker   . Smokeless tobacco: Never Used  . Alcohol Use: No   OB History    No data available     Review of Systems  Constitutional: Negative for chills.  HENT: Negative for facial swelling.   Eyes: Negative for visual disturbance.   Respiratory: Negative for shortness of breath.   Cardiovascular: Negative for chest pain.  Gastrointestinal: Negative for nausea, vomiting and abdominal pain.  Musculoskeletal: Positive for arthralgias. Negative for back pain and neck pain.  Skin: Negative for rash and wound.  Neurological: Negative for dizziness, syncope, weakness, light-headedness, numbness and headaches.  All other systems reviewed and are negative.     Allergies  Penicillins and Statins  Home Medications   Prior to Admission medications   Medication Sig Start Date End Date Taking? Authorizing Provider  5-Hydroxytryptophan (5-HTP PO) Take 200 mg by mouth daily.    Historical Provider, MD  amphetamine-dextroamphetamine (ADDERALL) 15 MG tablet Take 1 tablet by mouth 2 (two) times daily. 04/13/16   Kristian CoveyBruce W Burchette, MD  amphetamine-dextroamphetamine (ADDERALL) 15 MG tablet Take 1 tablet by mouth 2 (two) times daily. 04/13/16   Kristian CoveyBruce W Burchette, MD  amphetamine-dextroamphetamine (ADDERALL) 15 MG tablet Take 1 tablet by mouth 2 (two) times daily. 04/13/16   Kristian CoveyBruce W Burchette, MD  baclofen (LIORESAL) 10 MG tablet Take 1 tablet (10 mg total) by mouth 3 (three) times daily. 11/24/15   Kristian CoveyBruce W Burchette, MD  diclofenac sodium (VOLTAREN) 1 % GEL Apply up  to 4 times per day as needed for pain. 11/24/15   Kristian Covey, MD  DULoxetine (CYMBALTA) 30 MG capsule Take One Tablet Twice Per Day. 01/15/16   Kristian Covey, MD  esomeprazole (NEXIUM) 40 MG capsule Take 1 capsule (40 mg total) by mouth daily before breakfast. 11/30/12   Kristian Covey, MD  fluticasone (FLONASE) 50 MCG/ACT nasal spray PLACE 2 SPRAYS INTO BOTH NOSTRILS DAILY 02/23/16   Kristian Covey, MD  gabapentin (NEURONTIN) 400 MG capsule Take 1 capsule (400 mg total) by mouth 3 (three) times daily. 11/24/15   Kristian Covey, MD  gabapentin (NEURONTIN) 800 MG tablet TAKE 1 TABLET (800 MG TOTAL) BY MOUTH 4 (FOUR) TIMES DAILY. 02/04/15   Kristian Covey, MD   gabapentin (NEURONTIN) 800 MG tablet TAKE 1 TABLET (800 MG TOTAL) BY MOUTH 4 (FOUR) TIMES DAILY. 01/17/16   Kristian Covey, MD  loratadine (CLARITIN) 10 MG tablet Take 10 mg by mouth daily.    Historical Provider, MD  Multiple Vitamins-Minerals (MULTIVITAMIN & MINERAL PO) Take 1 tablet by mouth daily.    Historical Provider, MD  Nutritional Supplements (ADULT GROWTH HORMONE SUPPORT PO) Take 1 capsule by mouth daily.    Historical Provider, MD  oxyCODONE-acetaminophen (PERCOCET/ROXICET) 5-325 MG tablet 1 and 1/2 as needed daily (45 per month) 04/13/16   Kristian Covey, MD  Probiotic Product (PROBIOTIC COLON SUPPORT) CAPS Take 1 capsule by mouth daily.    Historical Provider, MD  temazepam (RESTORIL) 15 MG capsule TAKE ONE CAPSULE BY MOUTH AT BEDTIME AS NEEDED 11/12/15   Kristian Covey, MD  traMADol (ULTRAM) 50 MG tablet TAKE 2 TABLETS BY MOUTH 3 TIMES A DAY AS NEEDED 03/15/16   Kristian Covey, MD  triamcinolone cream (KENALOG) 0.1 % APPLY TO AFFECTED AREA EVERY DAY Patient taking differently: APPLY TO AFFECTED AREA EVERY DAY AS NEEDED 12/16/14   Kristian Covey, MD  vitamin C (ASCORBIC ACID) 500 MG tablet Take 500 mg by mouth daily.    Historical Provider, MD  VITAMIN D, CHOLECALCIFEROL, PO Take 2,000 Units by mouth daily.     Historical Provider, MD   BP 133/73 mmHg  Pulse 70  Temp(Src) 98 F (36.7 C) (Oral)  Resp 18  Ht 5\' 4"  (1.626 m)  Wt 165 lb (74.844 kg)  BMI 28.31 kg/m2  SpO2 98% Physical Exam  Constitutional: She is oriented to person, place, and time. She appears well-developed and well-nourished. No distress.  HENT:  Head: Normocephalic and atraumatic.  Mouth/Throat: Oropharynx is clear and moist. No oropharyngeal exudate.  Eyes: EOM are normal. Pupils are equal, round, and reactive to light.  Neck: Normal range of motion. Neck supple.  No posterior midline cervical tenderness to palpation.  Cardiovascular: Normal rate and regular rhythm.   Pulmonary/Chest: Effort  normal and breath sounds normal.  Abdominal: Soft. Bowel sounds are normal.  Musculoskeletal: Normal range of motion. She exhibits edema and tenderness.  Patient has tenderness to the distal radius of the left forearm. There is ecchymosis and swelling present. No snuffbox tenderness. Distal cap refill is intact. No elbow tenderness or decreased range of motion. Patient also has right ankle tenderness especially over the medial malleolus. There is ecchymosis present. No proximal fibular tenderness. Full range of motion of the knee without ligamentous instability. Distal pulses are equal and intact.  Neurological: She is alert and oriented to person, place, and time.  Skin: Skin is warm and dry. No rash noted. No erythema.  Psychiatric:  She has a normal mood and affect. Her behavior is normal.  Nursing note and vitals reviewed.   ED Course  Procedures  DIAGNOSTIC STUDIES: Oxygen Saturation is 95% on RA, poor by my interpretation.    COORDINATION OF CARE: 8:35 PM Discussed treatment plan with pt at bedside which includes x-ray and pt agreed to plan.  Imaging Review No results found. I have personally reviewed and evaluated these images as part of my medical decision-making.   MDM   Final diagnoses:  Distal radius fracture, left, closed, initial encounter  Right ankle sprain, initial encounter    I personally performed the services described in this documentation, which was scribed in my presence. The recorded information has been reviewed and is accurate.   Patient splinted and given follow-up with hand surgery. Return precautions given.  Loren Racer, MD 04/16/16 [id number]

## 2016-04-12 NOTE — ED Notes (Signed)
Pt tripped on "zip tie" at work-pain to left wrist and right ankle-limping gait to triage-stocking/velcro type band on wrist

## 2016-04-12 NOTE — Discharge Instructions (Signed)
Ankle Sprain °An ankle sprain is an injury to the strong, fibrous tissues (ligaments) that hold the bones of your ankle joint together.  °CAUSES °An ankle sprain is usually caused by a fall or by twisting your ankle. Ankle sprains most commonly occur when you step on the outer edge of your foot, and your ankle turns inward. People who participate in sports are more prone to these types of injuries.  °SYMPTOMS  °· Pain in your ankle. The pain may be present at rest or only when you are trying to stand or walk. °· Swelling. °· Bruising. Bruising may develop immediately or within 1 to 2 days after your injury. °· Difficulty standing or walking, particularly when turning corners or changing directions. °DIAGNOSIS  °Your caregiver will ask you details about your injury and perform a physical exam of your ankle to determine if you have an ankle sprain. During the physical exam, your caregiver will press on and apply pressure to specific areas of your foot and ankle. Your caregiver will try to move your ankle in certain ways. An X-ray exam may be done to be sure a bone was not broken or a ligament did not separate from one of the bones in your ankle (avulsion fracture).  °TREATMENT  °Certain types of braces can help stabilize your ankle. Your caregiver can make a recommendation for this. Your caregiver may recommend the use of medicine for pain. If your sprain is severe, your caregiver may refer you to a surgeon who helps to restore function to parts of your skeletal system (orthopedist) or a physical therapist. °HOME CARE INSTRUCTIONS  °· Apply ice to your injury for 1-2 days or as directed by your caregiver. Applying ice helps to reduce inflammation and pain. °· Put ice in a plastic bag. °· Place a towel between your skin and the bag. °· Leave the ice on for 15-20 minutes at a time, every 2 hours while you are awake. °· Only take over-the-counter or prescription medicines for pain, discomfort, or fever as directed by  your caregiver. °· Elevate your injured ankle above the level of your heart as much as possible for 2-3 days. °· If your caregiver recommends crutches, use them as instructed. Gradually put weight on the affected ankle. Continue to use crutches or a cane until you can walk without feeling pain in your ankle. °· If you have a plaster splint, wear the splint as directed by your caregiver. Do not rest it on anything harder than a pillow for the first 24 hours. Do not put weight on it. Do not get it wet. You may take it off to take a shower or bath. °· You may have been given an elastic bandage to wear around your ankle to provide support. If the elastic bandage is too tight (you have numbness or tingling in your foot or your foot becomes cold and blue), adjust the bandage to make it comfortable. °· If you have an air splint, you may blow more air into it or let air out to make it more comfortable. You may take your splint off at night and before taking a shower or bath. Wiggle your toes in the splint several times per day to decrease swelling. °SEEK MEDICAL CARE IF:  °· You have rapidly increasing bruising or swelling. °· Your toes feel extremely cold or you lose feeling in your foot. °· Your pain is not relieved with medicine. °SEEK IMMEDIATE MEDICAL CARE IF: °· Your toes are numb or blue. °·   You have severe pain that is increasing. MAKE SURE YOU:   Understand these instructions.  Will watch your condition.  Will get help right away if you are not doing well or get worse.   This information is not intended to replace advice given to you by your health care provider. Make sure you discuss any questions you have with your health care provider.   Document Released: 09/20/2005 Document Revised: 10/11/2014 Document Reviewed: 10/02/2011 Elsevier Interactive Patient Education 2016 Elsevier Inc.  Wrist Fracture A wrist fracture is a break or crack in one of the bones of your wrist. Your wrist is made up of  eight small bones at the palm of your hand (carpal bones) and two long bones that make up your forearm (radius and ulna). CAUSES  A direct blow to the wrist.  Falling on an outstretched hand.  Trauma, such as a car accident or a fall. RISK FACTORS Risk factors for wrist fracture include:  Participating in contact and high-risk sports, such as skiing, biking, and ice skating.  Taking steroid medicines.  Smoking.  Being female.  Being Caucasian.  Drinking more than three alcoholic beverages per day.  Having low or lowered bone density (osteoporosis or osteopenia).  Age. Older adults have decreased bone density.  Women who have had menopause.  History of previous fractures. SIGNS AND SYMPTOMS Symptoms of wrist fractures include tenderness, bruising, and inflammation. Additionally, the wrist may hang in an odd position or appear deformed. DIAGNOSIS Diagnosis may include:  Physical exam.  X-ray. TREATMENT Treatment depends on many factors, including the nature and location of the fracture, your age, and your activity level. Treatment for wrist fracture can be nonsurgical or surgical. Nonsurgical Treatment A plaster cast or splint may be applied to your wrist if the bone is in a good position. If the fracture is not in good position, it may be necessary for your health care provider to realign it before applying a splint or cast. Usually, a cast or splint will be worn for several weeks. Surgical Treatment Sometimes the position of the bone is so far out of place that surgery is required to apply a device to hold it together as it heals. Depending on the fracture, there are a number of options for holding the bone in place while it heals, such as a cast and metal pins. HOME CARE INSTRUCTIONS  Keep your injured wrist elevated and move your fingers as much as possible.  Do not put pressure on any part of your cast or splint. It may break.  Use a plastic bag to protect your  cast or splint from water while bathing or showering. Do not lower your cast or splint into water.  Take medicines only as directed by your health care provider.  Keep your cast or splint clean and dry. If it becomes wet, damaged, or suddenly feels too tight, contact your health care provider right away.  Do not use any tobacco products including cigarettes, chewing tobacco, or electronic cigarettes. Tobacco can delay bone healing. If you need help quitting, ask your health care provider.  Keep all follow-up visits as directed by your health care provider. This is important.  Ask your health care provider if you should take supplements of calcium and vitamins C and D to promote bone healing. SEEK MEDICAL CARE IF:  Your cast or splint is damaged, breaks, or gets wet.  You have a fever.  You have chills.  You have continued severe pain or more swelling  than you did before the cast was put on. SEEK IMMEDIATE MEDICAL CARE IF:  Your hand or fingernails on the injured arm turn blue or gray, or feel cold or numb.  You have decreased feeling in the fingers of your injured arm. MAKE SURE YOU:  Understand these instructions.  Will watch your condition.  Will get help right away if you are not doing well or get worse.   This information is not intended to replace advice given to you by your health care provider. Make sure you discuss any questions you have with your health care provider.   Document Released: 06/30/2005 Document Revised: 06/11/2015 Document Reviewed: 10/08/2011 Elsevier Interactive Patient Education [employer] Elsevier Inc.

## 2016-04-13 MED ORDER — AMPHETAMINE-DEXTROAMPHETAMINE 15 MG PO TABS
15.0000 mg | ORAL_TABLET | Freq: Two times a day (BID) | ORAL | Status: DC
Start: 2016-04-13 — End: 2016-09-14

## 2016-04-13 MED ORDER — OXYCODONE-ACETAMINOPHEN 5-325 MG PO TABS: ORAL_TABLET | ORAL | Status: DC

## 2016-04-13 MED ORDER — AMPHETAMINE-DEXTROAMPHETAMINE 15 MG PO TABS: 15.0000 mg | ORAL_TABLET | Freq: Two times a day (BID) | ORAL | Status: DC

## 2016-04-13 NOTE — Telephone Encounter (Signed)
Printed for signature

## 2016-04-13 NOTE — Telephone Encounter (Signed)
Last Adderall refill 11/24/15 #60 BID x3 Last Oxycodone refill 02/10/2016 #45 Last seen on 03/09/16 No pending appt

## 2016-04-13 NOTE — Addendum Note (Signed)
Addended by: Tempie HoistMCNEIL, Kody Brandl M on: 04/13/2016 04:39 PM   Modules accepted: Orders

## 2016-04-13 NOTE — Telephone Encounter (Signed)
Refills OK. 

## 2016-05-25 ENCOUNTER — Other Ambulatory Visit: Payer: Self-pay | Admitting: Family Medicine

## 2016-05-25 NOTE — Telephone Encounter (Signed)
Refill for 6 months. 

## 2016-05-25 NOTE — Telephone Encounter (Signed)
Pt requesting temazepam  Last refilled 11/12/15 #30 refills:5   Last OV 03/09/16   Okay to refill?

## 2016-05-26 ENCOUNTER — Encounter: Payer: Self-pay | Admitting: Family Medicine

## 2016-05-26 ENCOUNTER — Ambulatory Visit (INDEPENDENT_AMBULATORY_CARE_PROVIDER_SITE_OTHER): Payer: Federal, State, Local not specified - PPO | Admitting: Family Medicine

## 2016-05-26 VITALS — BP 120/72 | HR 97 | Temp 98.4°F | Ht 64.0 in | Wt 174.0 lb

## 2016-05-26 DIAGNOSIS — G600 Hereditary motor and sensory neuropathy: Secondary | ICD-10-CM

## 2016-05-26 DIAGNOSIS — F909 Attention-deficit hyperactivity disorder, unspecified type: Secondary | ICD-10-CM | POA: Diagnosis not present

## 2016-05-26 DIAGNOSIS — G6289 Other specified polyneuropathies: Secondary | ICD-10-CM

## 2016-05-26 DIAGNOSIS — F988 Other specified behavioral and emotional disorders with onset usually occurring in childhood and adolescence: Secondary | ICD-10-CM

## 2016-05-26 NOTE — Progress Notes (Signed)
Pre visit review using our clinic review tool, if applicable. No additional management support is needed unless otherwise documented below in the visit note. 

## 2016-05-26 NOTE — Progress Notes (Signed)
Subjective:     Patient ID: Alexandra Norman, female   DOB: 1956-07-17, 60 y.o.   MRN: [id number]  HPI Patient seen for medical follow-up. She had recent fall in July with fractures of the left radius along with what sounds like right tibia. Followed by orthopedics. She just had her cast left wrist and right walking boot taken off. She was prescribed Percocet by orthopedist and is tapering off and she plans to taper off Percocet completely after current prescription runs out. She had been placed recently on Cymbalta by psychiatrist and that does seem to be helping her chronic foot pain. She has Charcot-Marie-Tooth disease and has chronic neuropathic pain related to that. She is maintained currently on gabapentin.  Attention deficit disorder stable on Adderall. She's been out of work since her fall and feels that that has helped her stress levels tremendously. She is looking at going on disability related to her chronic neuropathy  Past Medical History:  Diagnosis Date  . ADD (attention deficit disorder)   . Allergy   . Arrhythmia   . Arthritis   . Charcot-Marie-Tooth disease   . Depression   . Fibromyalgia   . Headache(784.0)   . Heart murmur   . Hyperlipidemia   . Osteoarthritis   . Osteoporosis   . Rotator cuff tear   . Thyroid disease   . Ulcer   . UTI (urinary tract infection)    Past Surgical History:  Procedure Laterality Date  . CARDIAC SURGERY  1982   ASD repair   . CARPAL TUNNEL RELEASE  2007   bilateral  . FOOT SURGERY  2001 and 2005   bilateral    reports that she has never smoked. She has never used smokeless tobacco. She reports that she does not drink alcohol or use drugs. family history includes Alcohol abuse in her father and mother; Arthritis in her father and mother; Breast cancer in her sister; Mental illness in her mother and sister. Allergies  Allergen Reactions  . Penicillins     hives  . Statins      Review of Systems  Constitutional: Negative  for fatigue.  Eyes: Negative for visual disturbance.  Respiratory: Negative for cough, chest tightness, shortness of breath and wheezing.   Cardiovascular: Negative for chest pain, palpitations and leg swelling.  Genitourinary: Negative for dysuria.  Neurological: Negative for dizziness, seizures, syncope, weakness, light-headedness and headaches.       Objective:   Physical Exam  Constitutional: She appears well-developed and well-nourished.  Neck: Neck supple. No thyromegaly present.  Cardiovascular: Normal rate and regular rhythm.   Pulmonary/Chest: Effort normal and breath sounds normal. No respiratory distress. She has no wheezes. She has no rales.  Musculoskeletal: She exhibits no edema.       Assessment:     #1 attention deficit disorder stable  #2 recent fractures left radius and right ankle which apparently are healing well and followed by orthopedics  #3 Charcot-Marie-Tooth disease with chronic neuropathic pain-improved on Cymbalta    Plan:     -Recommend she schedule complete physical -She will try tapering off Percocet altogether -She'll return later for flu vaccination -continue Gabapentin and Cymbalta  Kristian CoveyBruce W Susen Haskew MD [facility]

## 2016-06-12 ENCOUNTER — Other Ambulatory Visit: Payer: Self-pay | Admitting: Family Medicine

## 2016-07-06 DIAGNOSIS — F4310 Post-traumatic stress disorder, unspecified: Secondary | ICD-10-CM | POA: Diagnosis not present

## 2016-07-13 ENCOUNTER — Other Ambulatory Visit: Payer: Self-pay | Admitting: Family Medicine

## 2016-07-20 DIAGNOSIS — F4310 Post-traumatic stress disorder, unspecified: Secondary | ICD-10-CM | POA: Diagnosis not present

## 2016-07-23 ENCOUNTER — Telehealth: Payer: Self-pay

## 2016-07-23 NOTE — Telephone Encounter (Signed)
PA approved & form faxed back to pharmacy.  

## 2016-07-23 NOTE — Telephone Encounter (Signed)
Received PA request for Adderall. PA submitted & is pending. Key: [id number]

## 2016-07-27 DIAGNOSIS — F4310 Post-traumatic stress disorder, unspecified: Secondary | ICD-10-CM | POA: Diagnosis not present

## 2016-08-09 DIAGNOSIS — M25532 Pain in left wrist: Secondary | ICD-10-CM | POA: Insufficient documentation

## 2016-08-12 DIAGNOSIS — Z9889 Other specified postprocedural states: Secondary | ICD-10-CM | POA: Insufficient documentation

## 2016-08-12 DIAGNOSIS — Z8781 Personal history of (healed) traumatic fracture: Secondary | ICD-10-CM | POA: Insufficient documentation

## 2016-08-18 ENCOUNTER — Telehealth: Payer: Self-pay

## 2016-08-18 NOTE — Telephone Encounter (Signed)
Received PA form from insurance company for Gabapentin. Form filled out & faxed back to insurance. Waiting for approval or denial.

## 2016-08-24 NOTE — Telephone Encounter (Signed)
Per insurance, PA is not needed because patient is taking the amount covered by insurance without a PA. Form faxed back to pharmacy.

## 2016-09-09 ENCOUNTER — Other Ambulatory Visit: Payer: Self-pay | Admitting: Family Medicine

## 2016-09-14 ENCOUNTER — Ambulatory Visit (INDEPENDENT_AMBULATORY_CARE_PROVIDER_SITE_OTHER): Payer: Federal, State, Local not specified - PPO | Admitting: Family Medicine

## 2016-09-14 VITALS — BP 140/80 | HR 93 | Temp 98.2°F | Ht 64.0 in | Wt 174.0 lb

## 2016-09-14 DIAGNOSIS — Z23 Encounter for immunization: Secondary | ICD-10-CM

## 2016-09-14 DIAGNOSIS — G600 Hereditary motor and sensory neuropathy: Secondary | ICD-10-CM

## 2016-09-14 DIAGNOSIS — F988 Other specified behavioral and emotional disorders with onset usually occurring in childhood and adolescence: Secondary | ICD-10-CM

## 2016-09-14 DIAGNOSIS — R51 Headache: Secondary | ICD-10-CM

## 2016-09-14 DIAGNOSIS — M810 Age-related osteoporosis without current pathological fracture: Secondary | ICD-10-CM

## 2016-09-14 DIAGNOSIS — R519 Headache, unspecified: Secondary | ICD-10-CM

## 2016-09-14 DIAGNOSIS — E039 Hypothyroidism, unspecified: Secondary | ICD-10-CM | POA: Diagnosis not present

## 2016-09-14 DIAGNOSIS — G6289 Other specified polyneuropathies: Secondary | ICD-10-CM | POA: Diagnosis not present

## 2016-09-14 MED ORDER — AMPHETAMINE-DEXTROAMPHETAMINE 15 MG PO TABS: 15.0000 mg | ORAL_TABLET | Freq: Two times a day (BID) | ORAL | 0 refills | Status: DC

## 2016-09-14 NOTE — Progress Notes (Signed)
Pre visit review using our clinic review tool, if applicable. No additional management support is needed unless otherwise documented below in the visit note. 

## 2016-09-14 NOTE — Progress Notes (Signed)
Subjective:     Patient ID: Alexandra Norman, female   DOB: 28-Jul-1956, 60 y.o.   MRN: [id number]  HPI Patient here today to discuss multiple issues as follows  She has history of Charcot-Marie-Tooth disease and has long-standing history of severe neuropathy. She currently takes regimen the Cymbalta and high-dose gabapentin which she has been on for years. She thinks she is having almost daily headaches related to Cymbalta. She recalls several years ago stopping Cymbalta and her headaches went away. She has already reduced her Cymbalta to 30 mg once daily. She would like to discontinue this altogether.  History of osteoporosis. Previous intolerance with Fosamax which caused myalgias. She had recent complicated left radial fracture that required surgery. She takes calcium and vitamin D. She would like to explore other possible options. Has never been on Prolia.  Attention deficit disorder. Takes Adderall 50 mg twice daily and needing refills. No chest pains.  Past Medical History:  Diagnosis Date  . ADD (attention deficit disorder)   . Allergy   . Arrhythmia   . Arthritis   . Charcot-Marie-Tooth disease   . Depression   . Fibromyalgia   . Headache(784.0)   . Heart murmur   . Hyperlipidemia   . Osteoarthritis   . Osteoporosis   . Rotator cuff tear   . Thyroid disease   . Ulcer (HCC)   . UTI (urinary tract infection)    Past Surgical History:  Procedure Laterality Date  . CARDIAC SURGERY  1982   ASD repair   . CARPAL TUNNEL RELEASE  2007   bilateral  . FOOT SURGERY  2001 and 2005   bilateral    reports that she has never smoked. She has never used smokeless tobacco. She reports that she does not drink alcohol or use drugs. family history includes Alcohol abuse in her father and mother; Arthritis in her father and mother; Breast cancer in her sister; Mental illness in her mother and sister. Allergies  Allergen Reactions  . Penicillins     hives  . Statins      Review of  Systems  Constitutional: Negative for fatigue and unexpected weight change.  Eyes: Negative for visual disturbance.  Respiratory: Negative for cough, chest tightness, shortness of breath and wheezing.   Cardiovascular: Negative for chest pain, palpitations and leg swelling.  Endocrine: Negative for polydipsia and polyuria.  Neurological: Positive for headaches. Negative for dizziness, seizures, syncope, weakness and light-headedness.       Objective:   Physical Exam  Constitutional: She appears well-developed and well-nourished.  Eyes: Pupils are equal, round, and reactive to light.  Neck: Neck supple. No JVD present. No thyromegaly present.  Cardiovascular: Normal rate and regular rhythm.  Exam reveals no gallop.   Pulmonary/Chest: Effort normal and breath sounds normal. No respiratory distress. She has no wheezes. She has no rales.  Musculoskeletal: She exhibits no edema.  Neurological: She is alert.       Assessment:     #1 long-standing history of neuropathy related to Charcot-Marie-Tooth disease  #2 daily tension-type headaches. Patient is concerned this is related to Cymbalta  #3 history of osteoporosis not currently treated with prior intolerance with oral bisphosphonates  #4 Attention deficit disorder    Plan:     -We'll look at possibility of Prolia injections every 6 months since she cannot tolerate bis-phosphonates -Flu vaccine given -Refill Adderall for 3 months -Discontinue Cymbalta and be in touch if headaches not resolving in 1-2 weeks  Kristian CoveyBruce W Giordan Fordham MD  [facility]

## 2016-10-11 DIAGNOSIS — F4310 Post-traumatic stress disorder, unspecified: Secondary | ICD-10-CM | POA: Diagnosis not present

## 2016-10-16 ENCOUNTER — Other Ambulatory Visit: Payer: Self-pay | Admitting: Family Medicine

## 2016-10-19 NOTE — Telephone Encounter (Signed)
Last OV was 09-14-2016 Last refill 03-15-2016 #[id number], 5rf Please advise

## 2016-10-20 NOTE — Telephone Encounter (Signed)
Refill with 5 additional refills.  Take Oxycodone off list as she no longer takes this.

## 2016-11-01 ENCOUNTER — Other Ambulatory Visit: Payer: Self-pay | Admitting: Family Medicine

## 2016-11-10 ENCOUNTER — Other Ambulatory Visit (INDEPENDENT_AMBULATORY_CARE_PROVIDER_SITE_OTHER): Payer: Federal, State, Local not specified - PPO

## 2016-11-10 DIAGNOSIS — R7989 Other specified abnormal findings of blood chemistry: Secondary | ICD-10-CM

## 2016-11-10 DIAGNOSIS — Z0000 Encounter for general adult medical examination without abnormal findings: Secondary | ICD-10-CM

## 2016-11-10 LAB — BASIC METABOLIC PANEL
BUN: 10 mg/dL (ref 6–23)
CO2: 29 mEq/L (ref 19–32)
Calcium: 9.4 mg/dL (ref 8.4–10.5)
Chloride: 105 mEq/L (ref 96–112)
Creatinine, Ser: 0.57 mg/dL (ref 0.40–1.20)
GFR: 114.58 mL/min (ref >60.00)
Glucose, Bld: 111 mg/dL — ABNORMAL HIGH (ref 70–99)
Potassium: 4.2 mEq/L (ref 3.5–5.1)
Sodium: 142 mEq/L (ref 135–145)

## 2016-11-10 LAB — CBC WITH DIFFERENTIAL/PLATELET
Basophils Absolute: 0 10*3/uL (ref 0.0–0.1)
Basophils Relative: 0.4 % (ref 0.0–3.0)
Eosinophils Absolute: 0.1 10*3/uL (ref 0.0–0.7)
Eosinophils Relative: 1.7 % (ref 0.0–5.0)
HCT: 40.5 % (ref 36.0–46.0)
Hemoglobin: 13.7 g/dL (ref 12.0–15.0)
Lymphocytes Relative: 33.5 % (ref 12.0–46.0)
Lymphs Abs: 2 10*3/uL (ref 0.7–4.0)
MCHC: 33.8 g/dL (ref 30.0–36.0)
MCV: 88.8 fl (ref 78.0–100.0)
Monocytes Absolute: 0.3 10*3/uL (ref 0.1–1.0)
Monocytes Relative: 5 % (ref 3.0–12.0)
Neutro Abs: 3.6 10*3/uL (ref 1.4–7.7)
Neutrophils Relative %: 59.4 % (ref 43.0–77.0)
Platelets: 336 10*3/uL (ref 150.0–400.0)
RBC: 4.56 Mil/uL (ref 3.87–5.11)
RDW: 13.7 % (ref 11.5–15.5)
WBC: 6.1 10*3/uL (ref 4.0–10.5)

## 2016-11-10 LAB — HEPATIC FUNCTION PANEL
ALT: 25 U/L (ref 0–35)
AST: 24 U/L (ref 0–37)
Albumin: 4 g/dL (ref 3.5–5.2)
Alkaline Phosphatase: 88 U/L (ref 39–117)
Bilirubin, Direct: 0.1 mg/dL (ref 0.0–0.3)
Total Bilirubin: 0.4 mg/dL (ref 0.2–1.2)
Total Protein: 6.3 g/dL (ref 6.0–8.3)

## 2016-11-10 LAB — LIPID PANEL
Cholesterol: 305 mg/dL — ABNORMAL HIGH (ref 0–200)
HDL: 37.4 mg/dL — ABNORMAL LOW (ref >39.00)
NonHDL: 267.49
Total CHOL/HDL Ratio: 8
Triglycerides: 313 mg/dL — ABNORMAL HIGH (ref 0.0–149.0)
VLDL: 62.6 mg/dL — ABNORMAL HIGH (ref 0.0–40.0)

## 2016-11-10 LAB — TSH: TSH: 0.67 u[IU]/mL (ref 0.35–4.50)

## 2016-11-10 LAB — LDL CHOLESTEROL, DIRECT: Direct LDL: 182 mg/dL

## 2016-11-17 ENCOUNTER — Ambulatory Visit (INDEPENDENT_AMBULATORY_CARE_PROVIDER_SITE_OTHER): Payer: Federal, State, Local not specified - PPO | Admitting: Family Medicine

## 2016-11-17 ENCOUNTER — Encounter: Payer: Self-pay | Admitting: Family Medicine

## 2016-11-17 VITALS — BP 120/68 | HR 77 | Ht 64.0 in | Wt 177.3 lb

## 2016-11-17 DIAGNOSIS — M810 Age-related osteoporosis without current pathological fracture: Secondary | ICD-10-CM

## 2016-11-17 DIAGNOSIS — R5383 Other fatigue: Secondary | ICD-10-CM

## 2016-11-17 DIAGNOSIS — Z0000 Encounter for general adult medical examination without abnormal findings: Secondary | ICD-10-CM

## 2016-11-17 NOTE — Progress Notes (Signed)
Subjective:     Patient ID: Alexandra Norman, female   DOB: 01-25-1956, 61 y.o.   MRN: [id number]  HPI Patient seen for physical exam. Her chronic problems include history of GERD, hypothyroidism, Charcot-Marie-Tooth disease, osteoporosis, osteoarthritis, hyperlipidemia, fibromyalgia, attention deficit disorder, and history of depression.   She's not had several health maintenance issues including shingles vaccine, recent Pap smear, or mammogram. She has osteoporosis with last DEXA scan several years ago. She could not tolerate Fosamax. She's had previous hypothyroidism and took her self off levothyroxine. She is taking some type of natural supplement. Recent TSH normal.  Nonsmoker. No alcohol. Colonoscopy up-to-date.  Past Medical History:  Diagnosis Date  . ADD (attention deficit disorder)   . Allergy   . Arrhythmia   . Arthritis   . Charcot-Marie-Tooth disease   . Depression   . Fibromyalgia   . Headache(784.0)   . Heart murmur   . Hyperlipidemia   . Osteoarthritis   . Osteoporosis   . Rotator cuff tear   . Thyroid disease   . Ulcer (HCC)   . UTI (urinary tract infection)    Past Surgical History:  Procedure Laterality Date  . CARDIAC SURGERY  1982   ASD repair   . CARPAL TUNNEL RELEASE  2007   bilateral  . FOOT SURGERY  2001 and 2005   bilateral  . HERNIA REPAIR    . WRIST SURGERY Left 08/12/2017    reports that she has never smoked. She has never used smokeless tobacco. She reports that she does not drink alcohol or use drugs. family history includes Alcohol abuse in her father and mother; Arthritis in her father and mother; Breast cancer in her sister; Mental illness in her mother and sister. Allergies  Allergen Reactions  . Penicillins     hives  . Statins      Review of Systems  Constitutional: Positive for fatigue. Negative for activity change, appetite change, fever and unexpected weight change.  HENT: Negative for ear pain, hearing loss, sore throat and  trouble swallowing.   Eyes: Negative for visual disturbance.  Respiratory: Negative for cough and shortness of breath.   Cardiovascular: Negative for chest pain and palpitations.  Gastrointestinal: Negative for abdominal pain, blood in stool, constipation and diarrhea.  Genitourinary: Negative for dysuria and hematuria.  Musculoskeletal: Positive for arthralgias. Negative for back pain and myalgias.  Skin: Negative for rash.  Neurological: Negative for dizziness, syncope and headaches.  Hematological: Negative for adenopathy.  Psychiatric/Behavioral: Negative for confusion and dysphoric mood.       Objective:   Physical Exam  Constitutional: She is oriented to person, place, and time. She appears well-developed and well-nourished.  HENT:  Head: Normocephalic and atraumatic.  Eyes: EOM are normal. Pupils are equal, round, and reactive to light.  Neck: Normal range of motion. Neck supple. No thyromegaly present.  Cardiovascular: Normal rate, regular rhythm and normal heart sounds.   No murmur heard. Pulmonary/Chest: Breath sounds normal. No respiratory distress. She has no wheezes. She has no rales.  Abdominal: Soft. Bowel sounds are normal. She exhibits no distension and no mass. There is no tenderness. There is no rebound and no guarding.  Genitourinary:  Genitourinary Comments: Declines Pap smear  Musculoskeletal: Normal range of motion. She exhibits no edema.  Lymphadenopathy:    She has no cervical adenopathy.  Neurological: She is alert and oriented to person, place, and time. She displays normal reflexes. No cranial nerve deficit.  Skin: No rash noted.  Psychiatric: She has  a normal mood and affect. Her behavior is normal. Judgment and thought content normal.       Assessment:     #1 physical exam. Patient has not had recent Pap smear or mammogram but declines both. No history of shingles vaccine.  #2 osteoporosis history. No recent DEXA scan  #3 prediabetes with recent  fasting glucose 111  #4 dyslipidemia with prior intolerance with statins    Plan:     -Set up repeat DEXA scan -Check on insurance coverage for shingles vaccine -Recommended consider Pap smear and mammogram- and she declines both -Reduce sugars and starches and try to lose some weight -Consider six-month follow-up and check A1c -Long discussion regarding potential antilipid treatments. She cannot tolerate statins. We discussed other options such as Zetia or WelChol and at this point she wishes to wait -Check on coverage for Prolia-since she cannot tolerate bisphosphonates -Patient requesting free T4 and free T3 along with repeat TSH -Recommend regular weightbearing exercise and daily calcium and vitamin D  Alexandra CoveyBruce W Sherrica Niehaus MD [facility]

## 2016-11-17 NOTE — Patient Instructions (Signed)
Osteoporosis Osteoporosis is the thinning and loss of density in the bones. Osteoporosis makes the bones more brittle, fragile, and likely to break (fracture). Over time, osteoporosis can cause the bones to become so weak that they fracture after a simple fall. The bones most likely to fracture are the bones in the hip, wrist, and spine. What are the causes? The exact cause is not known. What increases the risk? Anyone can develop osteoporosis. You may be at greater risk if you have a family history of the condition or have poor nutrition. You may also have a higher risk if you are:  Female.  61 years old or older.  A smoker.  Not physically active.  White or Asian.  Slender. What are the signs or symptoms? A fracture might be the first sign of the disease, especially if it results from a fall or injury that would not usually cause a bone to break. Other signs and symptoms include:  Low back and neck pain.  Stooped posture.  Height loss. How is this diagnosed? To make a diagnosis, your health care provider may:  Take a medical history.  Perform a physical exam.  Order tests, such as:  A bone mineral density test.  A dual-energy X-ray absorptiometry test. How is this treated? The goal of osteoporosis treatment is to strengthen your bones to reduce your risk of a fracture. Treatment may involve:  Making lifestyle changes, such as:  Eating a diet rich in calcium.  Doing weight-bearing and muscle-strengthening exercises.  Stopping tobacco use.  Limiting alcohol intake.  Taking medicine to slow the process of bone loss or to increase bone density.  Monitoring your levels of calcium and vitamin D. Follow these instructions at home:  Include calcium and vitamin D in your diet. Calcium is important for bone health, and vitamin D helps the body absorb calcium.  Perform weight-bearing and muscle-strengthening exercises as directed by your health care provider.  Do  not use any tobacco products, including cigarettes, chewing tobacco, and electronic cigarettes. If you need help quitting, ask your health care provider.  Limit your alcohol intake.  Take medicines only as directed by your health care provider.  Keep all follow-up visits as directed by your health care provider. This is important.  Take precautions at home to lower your risk of falling, such as:  Keeping rooms well lit and clutter free.  Installing safety rails on stairs.  Using rubber mats in the bathroom and other areas that are often wet or slippery. Get help right away if: You fall or injure yourself. This information is not intended to replace advice given to you by your health care provider. Make sure you discuss any questions you have with your health care provider. Document Released: 06/30/2005 Document Revised: 02/23/2016 Document Reviewed: 02/28/2014 Elsevier Interactive Patient Education  2017 [employer] Fat and Cholesterol Restricted Diet High levels of fat and cholesterol in your blood may lead to various health problems, such as diseases of the heart, blood vessels, gallbladder, liver, and pancreas. Fats are concentrated sources of energy that come in various forms. Certain types of fat, including saturated fat, may be harmful in excess. Cholesterol is a substance needed by your body in small amounts. Your body makes all the cholesterol it needs. Excess cholesterol comes from the food you eat. When you have high levels of cholesterol and saturated fat in your blood, health problems can develop because the excess fat and cholesterol will gather along the walls of your blood  vessels, causing them to narrow. Choosing the right foods will help you control your intake of fat and cholesterol. This will help keep the levels of these substances in your blood within normal limits and reduce your risk of disease. What is my plan? Your health care provider recommends that you:  Limit  your fat intake to ______% or less of your total calories per day.  Limit the amount of cholesterol in your diet to less than _________mg per day.  Eat 20-30 grams of fiber each day. What types of fat should I choose?  Choose healthy fats more often. Choose monounsaturated and polyunsaturated fats, such as olive and canola oil, flaxseeds, walnuts, almonds, and seeds.  Eat more omega-3 fats. Good choices include salmon, mackerel, sardines, tuna, flaxseed oil, and ground flaxseeds. Aim to eat fish at least two times a week.  Limit saturated fats. Saturated fats are primarily found in animal products, such as meats, butter, and cream. Plant sources of saturated fats include palm oil, palm kernel oil, and coconut oil.  Avoid foods with partially hydrogenated oils in them. These contain trans fats. Examples of foods that contain trans fats are stick margarine, some tub margarines, cookies, crackers, and other baked goods. What general guidelines do I need to follow? These guidelines for healthy eating will help you control your intake of fat and cholesterol:  Check food labels carefully to identify foods with trans fats or high amounts of saturated fat.  Fill one half of your plate with vegetables and green salads.  Fill one fourth of your plate with whole grains. Look for the word "whole" as the first word in the ingredient list.  Fill one fourth of your plate with lean protein foods.  Limit fruit to two servings a day. Choose fruit instead of juice.  Eat more foods that contain fiber, such as apples, broccoli, carrots, beans, peas, and barley.  Eat more home-cooked food and less restaurant, buffet, and fast food.  Limit or avoid alcohol.  Limit foods high in starch and sugar.  Limit fried foods.  Cook foods using methods other than frying. Baking, boiling, grilling, and broiling are all great options.  Lose weight if you are overweight. Losing just 5-10% of your initial body  weight can help your overall health and prevent diseases such as diabetes and heart disease. What foods can I eat? Grains  Whole grains, such as whole wheat or whole grain breads, crackers, cereals, and pasta. Unsweetened oatmeal, bulgur, barley, quinoa, or brown rice. Corn or whole wheat flour tortillas. Vegetables  Fresh or frozen vegetables (raw, steamed, roasted, or grilled). Green salads. Fruits  All fresh, canned (in natural juice), or frozen fruits. Meats and other protein foods  Ground beef (85% or leaner), grass-fed beef, or beef trimmed of fat. Skinless chicken or [place]. Ground chicken or [place]. Pork trimmed of fat. All fish and seafood. Eggs. Dried beans, peas, or lentils. Unsalted nuts or seeds. Unsalted canned or dry beans. Dairy  Low-fat dairy products, such as skim or 1% milk, 2% or reduced-fat cheeses, low-fat ricotta or cottage cheese, or plain low-fat yo Fats and oils  Tub margarines without trans fats. Light or reduced-fat mayonnaise and salad dressings. Avocado. Olive, canola, sesame, or safflower oils. Natural peanut or almond butter (choose ones without added sugar and oil). The items listed above may not be a complete list of recommended foods or beverages. Contact your dietitian for more options.  Foods to avoid Grains  White bread. White pasta. White  rice. Cornbread. Bagels, pastries, and croissants. Crackers that contain trans fat. Vegetables  White potatoes. Corn. Creamed or fried vegetables. Vegetables in a cheese sauce. Fruits  Dried fruits. Canned fruit in light or heavy syrup. Fruit juice. Meats and other protein foods  Fatty cuts of meat. Ribs, chicken wings, bacon, sausage, bologna, salami, chitterlings, fatback, hot dogs, bratwurst, and packaged luncheon meats. Liver and organ meats. Dairy  Whole or 2% milk, cream, half-and-half, and cream cheese. Whole milk cheeses. Whole-fat or sweetened yogurt. Full-fat cheeses. Nondairy creamers and whipped  toppings. Processed cheese, cheese spreads, or cheese curds. Beverages  Alcohol. Sweetened drinks (such as sodas, lemonade, and fruit drinks or punches). Fats and oils  Butter, stick margarine, lard, shortening, ghee, or bacon fat. Coconut, palm kernel, or palm oils. Sweets and desserts  Corn syrup, sugars, honey, and molasses. Candy. Jam and jelly. Syrup. Sweetened cereals. Cookies, pies, cakes, donuts, muffins, and ice cream. The items listed above may not be a complete list of foods and beverages to avoid. Contact your dietitian for more information.  This information is not intended to replace advice given to you by your health care provider. Make sure you discuss any questions you have with your health care provider. Document Released: 09/20/2005 Document Revised: 10/11/2014 Document Reviewed: 12/19/2013 Elsevier Interactive Patient Education  2017 [employer].

## 2016-11-17 NOTE — Progress Notes (Signed)
Pre visit review using our clinic review tool, if applicable. No additional management support is needed unless otherwise documented below in the visit note. 

## 2016-11-18 ENCOUNTER — Telehealth: Payer: Self-pay | Admitting: Family Medicine

## 2016-11-18 DIAGNOSIS — E039 Hypothyroidism, unspecified: Secondary | ICD-10-CM

## 2016-11-18 LAB — T3, FREE: T3, Free: 3.5 pg/mL (ref 2.3–4.2)

## 2016-11-18 LAB — TSH: TSH: 5.05 u[IU]/mL — ABNORMAL HIGH (ref 0.35–4.50)

## 2016-11-18 LAB — T4, FREE: Free T4: 0.68 ng/dL (ref 0.60–1.60)

## 2016-11-18 NOTE — Telephone Encounter (Signed)
Pt [facility] have [facility]

## 2016-11-18 NOTE — Telephone Encounter (Signed)
I called the pt and informed her the referral was approved by Dr Caryl NeverBurchette and the referral was placed and she is aware someone will call with appt info.

## 2016-11-18 NOTE — Telephone Encounter (Signed)
OK with me.

## 2016-11-23 ENCOUNTER — Ambulatory Visit (INDEPENDENT_AMBULATORY_CARE_PROVIDER_SITE_OTHER)
Admission: RE | Admit: 2016-11-23 | Discharge: 2016-11-23 | Disposition: A | Discharge status: Home / Self Care | Payer: Federal, State, Local not specified - PPO | Source: Ambulatory Visit | Attending: Family Medicine | Admitting: Family Medicine

## 2016-11-23 DIAGNOSIS — M810 Age-related osteoporosis without current pathological fracture: Secondary | ICD-10-CM

## 2016-11-28 DIAGNOSIS — M810 Age-related osteoporosis without current pathological fracture: Secondary | ICD-10-CM | POA: Diagnosis not present

## 2016-12-07 ENCOUNTER — Other Ambulatory Visit: Payer: Self-pay | Admitting: Family Medicine

## 2016-12-08 ENCOUNTER — Telehealth: Payer: Self-pay | Admitting: Family Medicine

## 2016-12-08 NOTE — Telephone Encounter (Signed)
Last refill was 05/26/16 and last office visit was 11/17/16.  Okay to refill?

## 2016-12-08 NOTE — Telephone Encounter (Addendum)
Pt is under the impression we was suppose to check with her insurance company to see if prolia is covered benefit. Pt has osteoporosis

## 2016-12-08 NOTE — Telephone Encounter (Signed)
Refill with 5 additional refills. 

## 2016-12-09 ENCOUNTER — Encounter: Payer: Self-pay | Admitting: Endocrinology

## 2016-12-09 ENCOUNTER — Ambulatory Visit (INDEPENDENT_AMBULATORY_CARE_PROVIDER_SITE_OTHER): Payer: Federal, State, Local not specified - PPO | Admitting: Endocrinology

## 2016-12-09 VITALS — BP 132/76 | HR 70 | Ht 64.0 in | Wt 175.0 lb

## 2016-12-09 DIAGNOSIS — R5383 Other fatigue: Secondary | ICD-10-CM | POA: Diagnosis not present

## 2016-12-09 DIAGNOSIS — E039 Hypothyroidism, unspecified: Secondary | ICD-10-CM

## 2016-12-09 NOTE — Telephone Encounter (Signed)
Pt has been scheduled 3/19 at 4:30 pm on your schedule.

## 2016-12-09 NOTE — Telephone Encounter (Signed)
Please schedule patient for a prolia injection.

## 2016-12-09 NOTE — Progress Notes (Signed)
Patient ID: Alexandra BeanCatherine Ledwith, female   DOB: 10/22/1955, 61 y.o.   MRN: [id number]             Referring Physician:  Caryl NeverBurchette  Reason for Appointment:  Hypothyroidism, new visit    History of Present Illness:   Hypothyroidism was first diagnosed in 2008  At the time of diagnosis patient was having symptoms of  fatigue, decreased motivation, difficulty concentrating, brain fog She was in another city at that time and no records of her initial evaluation are available She says she was told to have low thyroid and was started on levothyroxine as well as medication for ADHD She says she felt much better with this regimen for about a year Subsequently she has had tendency to fatigue which has been persistent   Review of her records indicate that she has been on 50 g of levothyroxine previously systole. Her TSH is always been normal In early 2017 the patient wanted to stop the medication on her own because she felt it was causing side effects She thinks that with stopping levothyroxine her joint pains in her hands and elsewhere were better She did not have any worsening of her fatigue with stopping levothyroxine at bedtime  Subsequently she was trying some herbal supplements, mostly containing iodine until about a month or 2 ago  In 2/18 her screening TSH was normal at 0.67 However she requested repeat labs done with free T4 and free T3 levels and at this time her TSH was high 0.05   Patient's weight history is as follows:  Wt Readings from Last 3 Encounters:  12/09/16 175 lb (79.4 kg)  11/17/16 177 lb 4.8 oz (80.4 kg)  09/14/16 174 lb (78.9 kg)    Thyroid function results have been as follows:  Lab Results  Component Value Date   FREET4 0.68 11/17/2016   TSH 5.05 (H) 11/17/2016   TSH 0.67 11/10/2016   TSH 1.29 11/24/2015    Lab Results  Component Value Date   TSH 5.05 (H) 11/17/2016   TSH 0.67 11/10/2016   TSH 1.29 11/24/2015   TSH 0.85 07/30/2014   TSH 3.37  10/01/2013   TSH 0.52 02/05/2013   TSH 2.73 04/05/2012     Past Medical History:  Diagnosis Date  . ADD (attention deficit disorder)   . Allergy   . Arrhythmia   . Arthritis   . Charcot-Marie-Tooth disease   . Depression   . Fibromyalgia   . Headache(784.0)   . Heart murmur   . Hyperlipidemia   . Osteoarthritis   . Osteoporosis   . Rotator cuff tear   . Thyroid disease   . Ulcer (HCC)   . UTI (urinary tract infection)     Past Surgical History:  Procedure Laterality Date  . CARDIAC SURGERY  1982   ASD repair   . CARPAL TUNNEL RELEASE  2007   bilateral  . FOOT SURGERY  2001 and 2005   bilateral  . HERNIA REPAIR    . WRIST SURGERY Left 08/12/2017    Family History  Problem Relation Age of Onset  . Alcohol abuse Mother   . Arthritis Mother   . Mental illness Mother     Suicide in [id number]  . Alcohol abuse Father   . Arthritis Father   . Mental illness Sister     Suicide in [id number]  . Breast cancer Sister     Social History:  reports that she has never smoked. She has never used smokeless tobacco. She  reports that she does not drink alcohol or use drugs.  Allergies:  Allergies  Allergen Reactions  . Penicillins     hives  . Statins     Allergies as of 12/09/2016      Reactions   Penicillins    hives   Statins       Medication List       Accurate as of 12/09/16  2:28 PM. Always use your most recent med list.          ADULT GROWTH HORMONE SUPPORT PO Take 1 capsule by mouth daily.   amphetamine-dextroamphetamine 15 MG tablet Commonly known as:  ADDERALL Take 1 tablet by mouth 2 (two) times daily.   amphetamine-dextroamphetamine 15 MG tablet Commonly known as:  ADDERALL Take 1 tablet by mouth 2 (two) times daily.   amphetamine-dextroamphetamine 15 MG tablet Commonly known as:  ADDERALL Take 1 tablet by mouth 2 (two) times daily.   baclofen 10 MG tablet Commonly known as:  LIORESAL TAKE 1 TABLET BY MOUTH 3 TIMES A DAY   diclofenac sodium 1 %  Gel Commonly known as:  VOLTAREN Apply up to 4 times per day as needed for pain.   esomeprazole 40 MG capsule Commonly known as:  NEXIUM Take 1 capsule (40 mg total) by mouth daily before breakfast.   fluticasone 50 MCG/ACT nasal spray Commonly known as:  FLONASE PLACE 2 SPRAYS INTO BOTH NOSTRILS DAILY   gabapentin 400 MG capsule Commonly known as:  NEURONTIN Take 400 mg by mouth once as needed.   gabapentin 800 MG tablet Commonly known as:  NEURONTIN TAKE 1 TABLET BY MOUTH 4 TIMES A DAY   loratadine 10 MG tablet Commonly known as:  CLARITIN Take 10 mg by mouth daily.   MULTIVITAMIN & MINERAL PO Take 1 tablet by mouth daily.   ondansetron 4 MG tablet Commonly known as:  ZOFRAN   oxyCODONE-acetaminophen 5-325 MG tablet Commonly known as:  PERCOCET/ROXICET 1 and 1/2 as needed daily (45 per month)   PROBIOTIC COLON SUPPORT Caps Take 1 capsule by mouth daily.   temazepam 15 MG capsule Commonly known as:  RESTORIL TAKE ONE CAPSULE BY MOUTH AT BEDTIME AS NEEDED   traMADol 50 MG tablet Commonly known as:  ULTRAM TAKE 2 TABLETS BY MOUTH 3 TIMES A DAY AS NEEDED   triamcinolone cream 0.1 % Commonly known as:  KENALOG APPLY TO AFFECTED AREA EVERY DAY   vitamin C 500 MG tablet Commonly known as:  ASCORBIC ACID Take 500 mg by mouth daily.   VITAMIN D (CHOLECALCIFEROL) PO Take 2,000 Units by mouth daily.         Review of Systems  Constitutional: Positive for weight gain.       Gradual weight gain, has difficulty losing weight  HENT: Negative for trouble swallowing.   Respiratory: Negative for shortness of breath.   Cardiovascular: Negative for chest pain and leg swelling.  Gastrointestinal: Negative for abdominal pain.  Endocrine: Positive for fatigue. Negative for light-headedness.  Musculoskeletal: Positive for joint pain and muscle aches.       Has pain mostly in finger joints. Long-standing history of fibromyalgia  Skin: Negative for dry skin.       Cold  feet  Neurological: Positive for balance difficulty.  Psychiatric/Behavioral: Positive for insomnia.       Has history of depression   Menopause, Age at onset 60, had hot flashes for sometime treated with herbal supplements only   She has long-standing history of ADHD,  still on treatment           Examination:    BP 132/76   Pulse 70   Ht 5\' 4"  (1.626 m)   Wt 175 lb (79.4 kg)   SpO2 96%   BMI 30.04 kg/m   GENERAL:  Average build. Mild generalized obesity present  No pallor, clubbing, lymphadenopathy or edema.    Skin:  no rash or pigmentation.  EYES:  No prominence of the eyes or swelling of the eyelids  ENT: Oral mucosa and tongue normal.  THYROID:  Not palpable.  HEART:  Normal  S1 and S2; no murmur or click.  CHEST:    Lungs: Vescicular breath sounds heard equally.  No crepitations/ wheeze.  ABDOMEN:  No distention.  Liver and spleen not palpable.  No other mass or tenderness.  NEUROLOGICAL: She has atrophy of the muscles of her right hand Reflexes cannot be elicited that biceps or ankles  JOINTS:  Mild swelling of the finger joints.   Assessment:  HYPOTHYROIDISM by history. She has had long-standing nonspecific symptoms of fatigue of unclear etiology Although previously had been on levothyroxine 50 g daily with continued symptoms of fatigue she has not had any abnormal TSH levels documented until 3 weeks ago She has been without any levothyroxine for about a year and TSH levels last month were not consistent She does not have a goiter in exam Since her free T4 is low normal she may possibly have secondary hypothyroidism Has not had evaluation for thyroid antibodies confirm that she has Hashimoto's thyroiditis  Patient is convinced that levothyroxine caused joint pains in the past and does not want to take this  PLAN:   Discussed that her hypothyroidism is likely mild but not consistent and has not progressed since she was first diagnosed Her free T3 is  quite normal However she is not willing to give a trial of low-dose 25 g of levothyroxine she is afraid it will cause joint pains.  Since she is not wanting to try levothyroxine and explained to her that in her situation there is no other alternative For this reason she will continue to be on observation alone without any treatment  Offered periodic follow-up and she will call if she wants to come back, she thinks she can improve her symptoms with lifestyle changes   Maleia Weems 12/09/2016, 2:28 PM   Consultation note copy sent to the PCP  Note: This office note was prepared with Dragon voice recognition system technology. Any transcriptional errors that result from this process are unintentional.

## 2016-12-20 ENCOUNTER — Ambulatory Visit (INDEPENDENT_AMBULATORY_CARE_PROVIDER_SITE_OTHER): Payer: Federal, State, Local not specified - PPO | Admitting: *Deleted

## 2016-12-20 DIAGNOSIS — M810 Age-related osteoporosis without current pathological fracture: Secondary | ICD-10-CM | POA: Diagnosis not present

## 2016-12-20 MED ORDER — DENOSUMAB 60 MG/ML ~~LOC~~ SOLN
60.0000 mg | Freq: Once | SUBCUTANEOUS | Status: AC
  Administered 2016-12-20: 60 mg via SUBCUTANEOUS

## 2016-12-27 DIAGNOSIS — K080 Exfoliation of teeth due to systemic causes: Secondary | ICD-10-CM | POA: Diagnosis not present

## 2017-01-21 ENCOUNTER — Telehealth: Payer: Self-pay | Admitting: Family Medicine

## 2017-01-21 NOTE — Telephone Encounter (Signed)
Pt states she got an EOB stating her prolia injection was denied.  And they are waiting on documentation from her doctor.Marland Kitchen  However, waited a month and then we called her and advise prolia was here and ready.  Pt Associate Professor.

## 2017-01-24 ENCOUNTER — Other Ambulatory Visit: Payer: Self-pay | Admitting: Family Medicine

## 2017-01-26 NOTE — Telephone Encounter (Signed)
Called and spoke with patient. We have prior authorization paperwork and copies of paperwork from [facility] that state that patient is responsible for $360 copay. We provided patient with copies of paperwork approval and prior authorization for her to fax to [facility].

## 2017-01-26 NOTE — Telephone Encounter (Signed)
Patient was provided a copay card from [employer] that should help cover a portion of the $360 to make her out of pocket expense $25-50.

## 2017-02-21 DIAGNOSIS — K080 Exfoliation of teeth due to systemic causes: Secondary | ICD-10-CM | POA: Diagnosis not present

## 2017-02-23 ENCOUNTER — Other Ambulatory Visit: Payer: Self-pay | Admitting: Family Medicine

## 2017-02-27 ENCOUNTER — Other Ambulatory Visit: Payer: Self-pay | Admitting: Family Medicine

## 2017-03-01 ENCOUNTER — Other Ambulatory Visit: Payer: Self-pay | Admitting: Family Medicine

## 2017-03-02 NOTE — Telephone Encounter (Signed)
Patient is requesting for a new Rx for Triamcinolone cream which was last filled on 09/02/15. Last OV and labs was 11/17/16, please Advise if okey to refill.

## 2017-03-02 NOTE — Telephone Encounter (Signed)
Rx done. 

## 2017-03-02 NOTE — Telephone Encounter (Signed)
Refill OK

## 2017-03-17 ENCOUNTER — Other Ambulatory Visit: Payer: Self-pay | Admitting: Family Medicine

## 2017-03-23 ENCOUNTER — Other Ambulatory Visit: Payer: Self-pay | Admitting: Family Medicine

## 2017-03-29 ENCOUNTER — Telehealth: Payer: Self-pay | Admitting: Family Medicine

## 2017-03-29 MED ORDER — AMPHETAMINE-DEXTROAMPHETAMINE 15 MG PO TABS
15.0000 mg | ORAL_TABLET | Freq: Two times a day (BID) | ORAL | 0 refills | Status: DC
Start: 2017-03-29 — End: 2017-06-13

## 2017-03-29 MED ORDER — AMPHETAMINE-DEXTROAMPHETAMINE 15 MG PO TABS: 15.0000 mg | ORAL_TABLET | Freq: Two times a day (BID) | ORAL | 0 refills | Status: DC

## 2017-03-29 NOTE — Telephone Encounter (Signed)
Refill OK

## 2017-03-29 NOTE — Telephone Encounter (Signed)
Pt request refill amphetamine-dextroamphetamine (ADDERALL) 15 MG tablet °3 mo supply °

## 2017-03-29 NOTE — Telephone Encounter (Signed)
Patient is aware rx is ready for pick up 

## 2017-03-29 NOTE — Telephone Encounter (Signed)
Last refill 09/14/16 and last office visit 11/17/16.  Okay to fill?

## 2017-05-12 ENCOUNTER — Other Ambulatory Visit: Payer: Self-pay | Admitting: Family Medicine

## 2017-05-12 NOTE — Telephone Encounter (Signed)
Rx done. 

## 2017-05-12 NOTE — Telephone Encounter (Signed)
Refill with 5 additional refills. 

## 2017-05-12 NOTE — Telephone Encounter (Signed)
Last rx given on 10/22/16 for #180 with 5 ref

## 2017-05-23 ENCOUNTER — Telehealth: Payer: Self-pay | Admitting: *Deleted

## 2017-05-23 NOTE — Telephone Encounter (Signed)
Recommended follow-up to discuss

## 2017-05-23 NOTE — Telephone Encounter (Signed)
Left message on machine for patient to return our call 

## 2017-05-23 NOTE — Telephone Encounter (Signed)
[facility] [address] temazepam (RESTORIL) 15 MG capsule Product backordered/unavailable.  Okay to change?

## 2017-05-27 NOTE — Telephone Encounter (Signed)
Alexandra Norman pt returned your call °

## 2017-05-27 NOTE — Telephone Encounter (Signed)
Patient was able to get a refill from a different pharmacy

## 2017-06-01 DIAGNOSIS — H35373 Puckering of macula, bilateral: Secondary | ICD-10-CM | POA: Diagnosis not present

## 2017-06-09 ENCOUNTER — Telehealth: Payer: Self-pay | Admitting: Family Medicine

## 2017-06-09 NOTE — Telephone Encounter (Signed)
Pt request refill   amphetamine-dextroamphetamine (ADDERALL) 15 MG tablet  3 mo supply  Ok to leave message

## 2017-06-10 NOTE — Telephone Encounter (Signed)
Last refill 03/29/17 and last office visit 11/17/16.  Okay to fill?

## 2017-06-10 NOTE — Telephone Encounter (Signed)
Refills OK. 

## 2017-06-13 MED ORDER — AMPHETAMINE-DEXTROAMPHETAMINE 15 MG PO TABS: 15.0000 mg | ORAL_TABLET | Freq: Two times a day (BID) | ORAL | 0 refills | Status: DC

## 2017-06-13 MED ORDER — AMPHETAMINE-DEXTROAMPHETAMINE 15 MG PO TABS
15.0000 mg | ORAL_TABLET | Freq: Two times a day (BID) | ORAL | 0 refills | Status: DC
Start: 2017-06-13 — End: 2017-10-25

## 2017-06-14 ENCOUNTER — Other Ambulatory Visit: Payer: Self-pay | Admitting: Family Medicine

## 2017-06-14 ENCOUNTER — Telehealth: Payer: Self-pay | Admitting: Family Medicine

## 2017-06-14 NOTE — Telephone Encounter (Signed)
° ° ° °  Pt request refill of the following:   temazepam (RESTORIL) 15 MG capsule  [facility]

## 2017-06-14 NOTE — Telephone Encounter (Signed)
Rx ready for pick up and patient is aware 

## 2017-06-14 NOTE — Telephone Encounter (Signed)
Last refill 12/08/16 and last office visit 11/17/16.  Okay to fill?

## 2017-06-14 NOTE — Telephone Encounter (Signed)
Refill OK

## 2017-06-22 ENCOUNTER — Other Ambulatory Visit: Payer: Self-pay | Admitting: Orthopedic Surgery

## 2017-06-22 DIAGNOSIS — S62102K Fracture of unspecified carpal bone, left wrist, subsequent encounter for fracture with nonunion: Secondary | ICD-10-CM

## 2017-06-22 DIAGNOSIS — M25532 Pain in left wrist: Secondary | ICD-10-CM

## 2017-06-22 DIAGNOSIS — M25632 Stiffness of left wrist, not elsewhere classified: Secondary | ICD-10-CM

## 2017-06-23 ENCOUNTER — Encounter: Payer: Self-pay | Admitting: Family Medicine

## 2017-07-01 ENCOUNTER — Ambulatory Visit
Admission: RE | Admit: 2017-07-01 | Discharge: 2017-07-01 | Disposition: A | Discharge status: Home / Self Care | Source: Ambulatory Visit | Attending: Orthopedic Surgery | Admitting: Orthopedic Surgery

## 2017-07-01 DIAGNOSIS — M25532 Pain in left wrist: Secondary | ICD-10-CM

## 2017-07-01 DIAGNOSIS — S62102K Fracture of unspecified carpal bone, left wrist, subsequent encounter for fracture with nonunion: Secondary | ICD-10-CM

## 2017-07-01 DIAGNOSIS — M25632 Stiffness of left wrist, not elsewhere classified: Secondary | ICD-10-CM

## 2017-07-08 ENCOUNTER — Other Ambulatory Visit: Payer: Self-pay | Admitting: Family Medicine

## 2017-07-08 NOTE — Telephone Encounter (Signed)
Last refill 06/15/17 and last office visit 11/17/16.  Okay to fill?

## 2017-07-10 NOTE — Telephone Encounter (Signed)
Refill OK

## 2017-07-13 DIAGNOSIS — H02421 Myogenic ptosis of right eyelid: Secondary | ICD-10-CM | POA: Diagnosis not present

## 2017-07-14 ENCOUNTER — Other Ambulatory Visit: Payer: Self-pay | Admitting: Family Medicine

## 2017-07-19 ENCOUNTER — Other Ambulatory Visit: Payer: Self-pay | Admitting: Family Medicine

## 2017-07-19 ENCOUNTER — Encounter: Payer: Self-pay | Admitting: Family Medicine

## 2017-08-12 HISTORY — PX: WRIST SURGERY: SHX841

## 2017-08-14 ENCOUNTER — Other Ambulatory Visit: Payer: Self-pay | Admitting: Family Medicine

## 2017-08-22 ENCOUNTER — Other Ambulatory Visit: Payer: Self-pay | Admitting: Family Medicine

## 2017-08-22 NOTE — Telephone Encounter (Signed)
Left message on machine for patient to return our call 

## 2017-08-22 NOTE — Telephone Encounter (Signed)
Gabapentin 400 mg and 800 mg on med list.  Okay to fill?

## 2017-08-22 NOTE — Telephone Encounter (Signed)
Last refill 07/19/17 and last office visit 11/17/16.  Okay to fill

## 2017-08-22 NOTE — Telephone Encounter (Signed)
We have not recently changed her dose.  We need to clarify her current dose.

## 2017-08-22 NOTE — Telephone Encounter (Signed)
Refill with 2 additional refills. 

## 2017-08-24 ENCOUNTER — Telehealth: Payer: Self-pay | Admitting: Family Medicine

## 2017-08-24 NOTE — Telephone Encounter (Signed)
Called pt to clarify her Gabapentin dosage.  She takes the 800mg  4X/day and uses the 400mg  as needed for break through pain.   She stated,  "A bottle of the 400mg  dosage probably lasts me 6 months and I'm good with it right now".   No other questions.

## 2017-09-06 ENCOUNTER — Other Ambulatory Visit: Payer: Self-pay | Admitting: *Deleted

## 2017-09-06 MED ORDER — GABAPENTIN 400 MG PO CAPS: 400.0000 mg | ORAL_CAPSULE | Freq: Three times a day (TID) | ORAL | 0 refills | Status: DC

## 2017-09-19 ENCOUNTER — Telehealth: Payer: Self-pay | Admitting: Family Medicine

## 2017-09-19 NOTE — Telephone Encounter (Signed)
Pt's insurance was filled for prolia coverage, summery of benefits states that a Prior authorization is needed.   Due to pt's insurance pt must try a fosamax drug before we can attempt prolia tx.  Ok to start pt om fasamax? Please advise

## 2017-09-19 NOTE — Telephone Encounter (Signed)
I will need to discuss with her .she has appt 12-24.

## 2017-09-19 NOTE — Telephone Encounter (Signed)
Noted thank you

## 2017-09-22 ENCOUNTER — Telehealth: Payer: Self-pay | Admitting: Family Medicine

## 2017-09-22 NOTE — Telephone Encounter (Signed)
Pt received prolia injection on 12/20/16 she is currently late on her injection...  I spoke with pt and she states that [employer] is refusing to pay for injection( given on 12/20/16) because they need a letter stating that pt "needs" tx along with dexa scan reports.  Please advise. ( I have reports printed already)

## 2017-09-22 NOTE — Telephone Encounter (Signed)
She is due for follow up anyway.  I suggest we set up follow up appt with me (if she does not already have) and we can address at that time.

## 2017-09-22 NOTE — Telephone Encounter (Signed)
Pt has an appt set for 09/28/17.   Thank you.

## 2017-09-26 ENCOUNTER — Ambulatory Visit: Payer: Self-pay | Admitting: Family Medicine

## 2017-09-28 ENCOUNTER — Ambulatory Visit: Payer: Self-pay | Admitting: Family Medicine

## 2017-09-29 ENCOUNTER — Other Ambulatory Visit: Payer: Self-pay | Admitting: *Deleted

## 2017-09-29 NOTE — Telephone Encounter (Signed)
Last refill given 5/23 #90 with 5 ref

## 2017-09-29 NOTE — Telephone Encounter (Signed)
Refill once 

## 2017-09-30 MED ORDER — BACLOFEN 10 MG PO TABS: 10.0000 mg | ORAL_TABLET | Freq: Three times a day (TID) | ORAL | 0 refills | Status: DC

## 2017-09-30 NOTE — Telephone Encounter (Signed)
Rx done. 

## 2017-10-05 ENCOUNTER — Other Ambulatory Visit: Payer: Self-pay

## 2017-10-05 MED ORDER — BACLOFEN 10 MG PO TABS: 10.0000 mg | ORAL_TABLET | Freq: Three times a day (TID) | ORAL | 0 refills | Status: DC

## 2017-10-25 ENCOUNTER — Ambulatory Visit: Payer: Federal, State, Local not specified - PPO | Admitting: Family Medicine

## 2017-10-25 VITALS — BP 122/60 | HR 61 | Temp 98.4°F | Ht 64.0 in | Wt 179.9 lb

## 2017-10-25 DIAGNOSIS — K219 Gastro-esophageal reflux disease without esophagitis: Secondary | ICD-10-CM | POA: Diagnosis not present

## 2017-10-25 DIAGNOSIS — F988 Other specified behavioral and emotional disorders with onset usually occurring in childhood and adolescence: Secondary | ICD-10-CM | POA: Diagnosis not present

## 2017-10-25 DIAGNOSIS — G600 Hereditary motor and sensory neuropathy: Secondary | ICD-10-CM

## 2017-10-25 DIAGNOSIS — G6289 Other specified polyneuropathies: Secondary | ICD-10-CM

## 2017-10-25 MED ORDER — BACLOFEN 10 MG PO TABS
10.0000 mg | ORAL_TABLET | Freq: Three times a day (TID) | ORAL | 1 refills | Status: DC
Start: 2017-10-25 — End: 2018-01-23

## 2017-10-25 MED ORDER — AMPHETAMINE-DEXTROAMPHETAMINE 15 MG PO TABS: 15.0000 mg | ORAL_TABLET | Freq: Two times a day (BID) | ORAL | 0 refills | Status: DC

## 2017-10-25 MED ORDER — GABAPENTIN 600 MG PO TABS: ORAL_TABLET | ORAL | 3 refills | Status: DC

## 2017-10-25 MED ORDER — AMPHETAMINE-DEXTROAMPHETAMINE 15 MG PO TABS
15.0000 mg | ORAL_TABLET | Freq: Two times a day (BID) | ORAL | 0 refills | Status: DC
Start: 2017-10-25 — End: 2018-07-13

## 2017-10-25 NOTE — Patient Instructions (Signed)
Try dropping the Gabapentin down by 200 mg per day each week until down to 600 mg four times daily.

## 2017-10-25 NOTE — Progress Notes (Signed)
Subjective:     Patient ID: Alexandra Norman, female   DOB: 1956-03-08, 62 y.o.   MRN: [id number]  HPI Patient seen to discuss multiple items as follows  Her chronic problems include history of Charcot-Marie-Tooth disease, GERD, atrial septal defect, osteoarthritis, peripheral neuropathy, fibromyalgia, ADD  She had complicated left upper extremity fracture last year with poor union of ulnar. She had surgery last fall and now has bone stimulator in place. She gets her cast off next week. Denies any arm pain at this time.  Attention deficit disorder. On Adderall 15 mg and usually only takes 1 tablet daily. This is working well no side effects.  Long-standing history of neuropathy. She's been on gabapentin 800 mgs 4 times daily and would like to consider whether she can titrate down. Her upper extremity pains are currently stable and well controlled. She quit working in the past year and her stress levels are much reduced and she feels much better overall.  She has history of some chronic muscle spasms takes baclofen and requesting refills.  Past Medical History:  Diagnosis Date  . ADD (attention deficit disorder)   . Allergy   . Arrhythmia   . Arthritis   . Charcot-Marie-Tooth disease   . Depression   . Fibromyalgia   . Headache(784.0)   . Heart murmur   . Hyperlipidemia   . Osteoarthritis   . Osteoporosis   . Rotator cuff tear   . Thyroid disease   . Ulcer (HCC)   . UTI (urinary tract infection)    Past Surgical History:  Procedure Laterality Date  . CARDIAC SURGERY  1982   ASD repair   . CARPAL TUNNEL RELEASE  2007   bilateral  . FOOT SURGERY  2001 and 2005   bilateral  . HERNIA REPAIR    . WRIST SURGERY Left 08/12/2017    reports that  has never smoked. she has never used smokeless tobacco. She reports that she does not drink alcohol or use drugs. family history includes Alcohol abuse in her father and mother; Arthritis in her father and mother; Breast cancer in her  sister; Mental illness in her mother and sister; Thyroid disease in her mother. Allergies  Allergen Reactions  . Penicillins     hives  . Statins      Review of Systems  Constitutional: Negative for fatigue.  Eyes: Negative for visual disturbance.  Respiratory: Negative for cough, chest tightness, shortness of breath and wheezing.   Cardiovascular: Negative for chest pain, palpitations and leg swelling.  Musculoskeletal: Positive for back pain.  Neurological: Negative for dizziness, seizures, syncope, weakness, light-headedness and headaches.       Objective:   Physical Exam  Constitutional: She appears well-developed and well-nourished.  Eyes: Pupils are equal, round, and reactive to light.  Neck: Neck supple. No JVD present. No thyromegaly present.  Cardiovascular: Normal rate and regular rhythm. Exam reveals no gallop.  Pulmonary/Chest: Effort normal and breath sounds normal. No respiratory distress. She has no wheezes. She has no rales.  Musculoskeletal: She exhibits no edema.  Left upper extremity is in a cast that she has external bone stimulator in place  Neurological: She is alert.       Assessment:     #1 neuropathy related to her Charcot-Marie-Tooth disease  #2 attention deficit disorder  #3 history of GERD  #4 history of frequent muscle spasms    Plan:     -Refilled baclofen for as needed use -She will like to try tapering back  gabapentin gradually. We've written for 600 mg dose and she will try reducing by 200 mg each week to dosage of 600 mg 4 times daily. If her pain is doing well at that dose we can consider further taper from there -Refill Adderall for 3 months  Kristian CoveyBruce W Cylie Dor MD [facility]

## 2017-10-26 ENCOUNTER — Encounter: Payer: Self-pay | Admitting: Family Medicine

## 2017-10-28 NOTE — Telephone Encounter (Signed)
Prior auth sent to [email or web address] for Gabapentin -[id number] pending response.  PA also sent for Adderall and approved Key: [id number] - PA Case ID: [id number]. I informed the pt via Mychart message.

## 2017-10-31 ENCOUNTER — Telehealth: Payer: Self-pay | Admitting: *Deleted

## 2017-10-31 NOTE — Telephone Encounter (Signed)
Note in [email or web address] state PA Case ID: [id number] was approved for Amphetamine Dextroamphetamine 15mg  and [facility] [address] and informed Khadija of this.

## 2017-11-16 DIAGNOSIS — H02421 Myogenic ptosis of right eyelid: Secondary | ICD-10-CM | POA: Diagnosis not present

## 2017-11-16 DIAGNOSIS — Z01818 Encounter for other preprocedural examination: Secondary | ICD-10-CM | POA: Diagnosis not present

## 2017-11-25 ENCOUNTER — Other Ambulatory Visit: Payer: Self-pay | Admitting: Family Medicine

## 2017-11-25 NOTE — Telephone Encounter (Signed)
Last office visit 10/25/16 and last refill 08/23/17.  Okay to fill?

## 2017-11-25 NOTE — Telephone Encounter (Signed)
Refills ok 

## 2017-11-25 NOTE — Telephone Encounter (Signed)
Refills OK. 

## 2017-11-25 NOTE — Telephone Encounter (Signed)
Last office visit 10/25/16 and last refill 05/12/17.  Okay to fill?

## 2017-12-19 ENCOUNTER — Encounter: Payer: Self-pay | Admitting: Family Medicine

## 2017-12-19 ENCOUNTER — Ambulatory Visit: Payer: Federal, State, Local not specified - PPO | Admitting: Family Medicine

## 2017-12-19 VITALS — BP 140/80 | HR 86 | Temp 98.1°F | Wt 172.6 lb

## 2017-12-19 DIAGNOSIS — S80212A Abrasion, left knee, initial encounter: Secondary | ICD-10-CM | POA: Diagnosis not present

## 2017-12-19 DIAGNOSIS — M810 Age-related osteoporosis without current pathological fracture: Secondary | ICD-10-CM | POA: Diagnosis not present

## 2017-12-19 DIAGNOSIS — L03115 Cellulitis of right lower limb: Secondary | ICD-10-CM

## 2017-12-19 MED ORDER — CEPHALEXIN 500 MG PO CAPS: 500.0000 mg | ORAL_CAPSULE | Freq: Four times a day (QID) | ORAL | 0 refills | Status: DC

## 2017-12-19 NOTE — Patient Instructions (Signed)
Keep wounds clean with soap and water. Leave off alcohol and hydrogen peroxide.

## 2017-12-19 NOTE — Progress Notes (Signed)
Subjective:     Patient ID: Alexandra Norman, female   DOB: 1956/04/03, 62 y.o.   MRN: [id number]  HPI Patient seen for several issues as follows  Last week, she states she developed a "GI bug". She had some nausea and diarrhea. She had leftover Zofran which reduced her vomiting. Tuesday night she states she got up to go the bathroom and slipped on the floor where she states there had been some diarrhea. She is not sure but thinks she may have fallen and lost consciousness. She has no recollection of fall. The next morning she noted what look like an abrasion left knee as well as right posterior calf area.  She's had some mild redness especially on the right calf wounds. Denies head injury. No headaches. No confusion. No h/o seizure.  Other issue is she has history of osteoporosis by DEXA scan last year. Placed on oral bisphosphonates but had severe myalgias. She also has history of GERD. She's been getting Prolia injections but apparently there was denial of coverage by insurance recently.  She has history of hypothyroidism, GERD, peripheral neuropathy, Charcot-Marie-Tooth disease, osteoporosis  Past Medical History:  Diagnosis Date  . ADD (attention deficit disorder)   . Allergy   . Arrhythmia   . Arthritis   . Charcot-Marie-Tooth disease   . Depression   . Fibromyalgia   . Headache(784.0)   . Heart murmur   . Hyperlipidemia   . Osteoarthritis   . Osteoporosis   . Rotator cuff tear   . Thyroid disease   . Ulcer   . UTI (urinary tract infection)    Past Surgical History:  Procedure Laterality Date  . CARDIAC SURGERY  1982   ASD repair   . CARPAL TUNNEL RELEASE  2007   bilateral  . FOOT SURGERY  2001 and 2005   bilateral  . HERNIA REPAIR    . WRIST SURGERY Left 08/12/2017    reports that  has never smoked. she has never used smokeless tobacco. She reports that she does not drink alcohol or use drugs. family history includes Alcohol abuse in her father and mother;  Arthritis in her father and mother; Breast cancer in her sister; Mental illness in her mother and sister; Thyroid disease in her mother. Allergies  Allergen Reactions  . Penicillins     hives  . Statins      Review of Systems  Constitutional: Negative for chills and fever.  Respiratory: Negative for shortness of breath.   Gastrointestinal: Negative for abdominal pain, diarrhea, nausea and vomiting.  Genitourinary: Negative for dysuria.  Neurological: Negative for dizziness, syncope, weakness and headaches.  Psychiatric/Behavioral: Negative for confusion.       Objective:   Physical Exam  Constitutional: She appears well-developed and well-nourished.  HENT:  Mouth/Throat: Oropharynx is clear and moist.  Neck: Neck supple.  Cardiovascular: Normal rate and regular rhythm.  Pulmonary/Chest: Effort normal and breath sounds normal. No respiratory distress. She has no wheezes. She has no rales.  Skin:  Patient has couple of wounds including left anterior knee which is superficial and 3 x 3 cm and there is a slightly smaller more inferior lateral with denuded epithelium which is 1 x 3 cm. Right posterior calf 4 x 6 cm wound which is superficial. She does have some mild surrounding erythema.       Assessment:     #1 patient has a couple skin wounds as above. She has one left anterior knee and right posterior calf. Patient describes these  as "abrasions ". However, we explained that right posterior calf would be unusual location (for abrasion). Right calf lesion almost looks more like a second-degree type burn potentially though she denies any burn history. I do have concern for some early cellulitis changes around the right calf wound. No necrosis.  #2 osteoporosis. She had intolerance with bisphosphonates. Has been on Prolia injections but insurance does not agreeing to cover    Plan:     -Start Keflex 500 mg 4 times a day and reassess within one week -Leave off topical alcohol or  peroxide or other topicals that could slow healing -Clean daily with soap and water gently -Will put together a letter try to get her Prolia injections covered by insurance  Alexandra CoveyBruce W Ondine Gemme MD [facility]

## 2017-12-20 ENCOUNTER — Encounter: Payer: Self-pay | Admitting: Family Medicine

## 2017-12-21 ENCOUNTER — Encounter: Payer: Self-pay | Admitting: Family Medicine

## 2017-12-26 ENCOUNTER — Encounter: Payer: Self-pay | Admitting: Family Medicine

## 2017-12-26 ENCOUNTER — Ambulatory Visit: Payer: Federal, State, Local not specified - PPO | Admitting: Family Medicine

## 2017-12-26 VITALS — BP 140/90 | HR 97 | Temp 98.6°F | Wt 177.0 lb

## 2017-12-26 DIAGNOSIS — S80811D Abrasion, right lower leg, subsequent encounter: Secondary | ICD-10-CM

## 2017-12-26 DIAGNOSIS — S80212D Abrasion, left knee, subsequent encounter: Secondary | ICD-10-CM | POA: Diagnosis not present

## 2017-12-26 NOTE — Progress Notes (Signed)
Subjective:     Patient ID: Alexandra Norman, female   DOB: 09/30/1956, 62 y.o.   MRN: [id number]  HPI Patient seen for follow-up regarding skin lesions right calf area as well as left anterior knee. Refer to recent note. She got up one night to vomit and somehow had slipped and fell down and does not sure exactly the mechanism of injury. She came in with some skin lesions including abrasions of the left anterior knee as well as right posterior calf area with some question of early cellulitis (right calf). She was start on Keflex and the redness has improved. No fevers or chills. Overall the seem to be slowly improving. She was concerned because some mild edema around the right ankle region since her injury. She has no significant bony tenderness at this time and no difficulty with weightbearing.  Past Medical History:  Diagnosis Date  . ADD (attention deficit disorder)   . Allergy   . Arrhythmia   . Arthritis   . Charcot-Marie-Tooth disease   . Depression   . Fibromyalgia   . Headache(784.0)   . Heart murmur   . Hyperlipidemia   . Osteoarthritis   . Osteoporosis   . Rotator cuff tear   . Thyroid disease   . Ulcer   . UTI (urinary tract infection)    Past Surgical History:  Procedure Laterality Date  . CARDIAC SURGERY  1982   ASD repair   . CARPAL TUNNEL RELEASE  2007   bilateral  . FOOT SURGERY  2001 and 2005   bilateral  . HERNIA REPAIR    . WRIST SURGERY Left 08/12/2017    reports that she has never smoked. She has never used smokeless tobacco. She reports that she does not drink alcohol or use drugs. family history includes Alcohol abuse in her father and mother; Arthritis in her father and mother; Breast cancer in her sister; Mental illness in her mother and sister; Thyroid disease in her mother. Allergies  Allergen Reactions  . Penicillins     hives  . Statins      Review of Systems  Constitutional: Negative for chills and fever.       Objective:   Physical  Exam  Constitutional: She appears well-developed and well-nourished.  Cardiovascular: Normal rate and regular rhythm.  Skin:  Eschar left knee over the patellar region with no signs of secondary infection.  She also has 2 areas of eschar with the largest being 3 x 6 cm right posterior calf. Previously noted areas of redness around the margin have resolved. Only trace edema right ankle and lower leg. Distal fibulas nontender to palpation.       Assessment:     Abrasions left knee and right posterior calf which appear to be healing well. Previously noted early cellulitis changes have resolved    Plan:     -Keep clean with soap and water -Finish out Keflex and follow-up promptly for any recurrent redness or other signs of infection   Kristian CoveyBruce W Kiyo Heal MD [facility]

## 2017-12-26 NOTE — Patient Instructions (Signed)
Continue to clean with soap and water Follow up for any recurrent redness

## 2018-01-09 ENCOUNTER — Other Ambulatory Visit: Payer: Self-pay | Admitting: Family Medicine

## 2018-01-09 NOTE — Telephone Encounter (Signed)
Last refill given 10/25/17 at office visit: -Refilled baclofen for as needed use -She will like to try tapering back gabapentin gradually. We've written for 600 mg dose and she will try reducing by 200 mg each week to dosage of 600 mg 4 times daily. If her pain is doing well at that dose we can consider further taper from there   Okay to fill?

## 2018-01-10 NOTE — Telephone Encounter (Signed)
Ok to refill both for 30 days

## 2018-01-19 ENCOUNTER — Encounter: Payer: Self-pay | Admitting: Family Medicine

## 2018-01-19 DIAGNOSIS — Z9181 History of falling: Secondary | ICD-10-CM

## 2018-01-23 ENCOUNTER — Other Ambulatory Visit: Payer: Self-pay | Admitting: Family Medicine

## 2018-01-23 DIAGNOSIS — R2689 Other abnormalities of gait and mobility: Secondary | ICD-10-CM | POA: Diagnosis not present

## 2018-01-31 ENCOUNTER — Ambulatory Visit: Payer: Federal, State, Local not specified - PPO | Admitting: Family Medicine

## 2018-01-31 ENCOUNTER — Encounter: Payer: Self-pay | Admitting: Family Medicine

## 2018-01-31 VITALS — BP 140/72 | HR 82 | Temp 98.2°F | Wt 173.1 lb

## 2018-01-31 DIAGNOSIS — R05 Cough: Secondary | ICD-10-CM | POA: Diagnosis not present

## 2018-01-31 DIAGNOSIS — R059 Cough, unspecified: Secondary | ICD-10-CM

## 2018-01-31 MED ORDER — AZITHROMYCIN 250 MG PO TABS: ORAL_TABLET | ORAL | 0 refills | Status: AC

## 2018-01-31 NOTE — Patient Instructions (Signed)

## 2018-01-31 NOTE — Progress Notes (Signed)
  Subjective:     Patient ID: Alexandra Norman, female   DOB: 25-May-1956, 62 y.o.   MRN: [id number]  HPI  Patient is nonsmoker seen with 12 day history of cough and chest congestion. She states she has coughed up some brown sputum. No fevers or chills. No dyspnea. She had one episode of some mild hemoptysis first day she had the cough but none since then. No appetite or weight changes.  Denies any recurrent nausea or vomiting.  Abrasions from recent fall are healing well.  Past Medical History:  Diagnosis Date  . ADD (attention deficit disorder)   . Allergy   . Arrhythmia   . Arthritis   . Charcot-Marie-Tooth disease   . Depression   . Fibromyalgia   . Headache(784.0)   . Heart murmur   . Hyperlipidemia   . Osteoarthritis   . Osteoporosis   . Rotator cuff tear   . Thyroid disease   . Ulcer   . UTI (urinary tract infection)    Past Surgical History:  Procedure Laterality Date  . CARDIAC SURGERY  1982   ASD repair   . CARPAL TUNNEL RELEASE  2007   bilateral  . FOOT SURGERY  2001 and 2005   bilateral  . HERNIA REPAIR    . WRIST SURGERY Left 08/12/2017    reports that she has never smoked. She has never used smokeless tobacco. She reports that she does not drink alcohol or use drugs. family history includes Alcohol abuse in her father and mother; Arthritis in her father and mother; Breast cancer in her sister; Mental illness in her mother and sister; Thyroid disease in her mother. Allergies  Allergen Reactions  . Penicillins     hives  . Statins     Review of Systems  Constitutional: Negative for chills and fever.  HENT: Negative for congestion, sinus pressure and sinus pain.   Respiratory: Positive for cough. Negative for shortness of breath.        Objective:   Physical Exam  Constitutional: She appears well-developed and well-nourished.  HENT:  Right Ear: External ear normal.  Left Ear: External ear normal.  Mouth/Throat: Oropharynx is clear and moist.   Neck: Neck supple.  Cardiovascular: Normal rate and regular rhythm.  Pulmonary/Chest: Effort normal and breath sounds normal. No respiratory distress. She has no wheezes. She has no rales.  Lymphadenopathy:    She has no cervical adenopathy.       Assessment:     Acute bronchitis. We explained that most bronchial infections are viral but she is concerned because of progressive symptoms over 2 weeks. No respiratory distress and afebrile    Plan:     -Start Zithromax -Continue good hydration -Follow-up promptly for any fever, increased shortness of breath, or other concerns  Kristian Covey MD [facility] at [facility]

## 2018-02-01 ENCOUNTER — Encounter: Payer: Self-pay | Admitting: Family Medicine

## 2018-02-18 ENCOUNTER — Other Ambulatory Visit: Payer: Self-pay | Admitting: Family Medicine

## 2018-02-20 NOTE — Telephone Encounter (Signed)
Last refill 01/11/18 and last office visit 01/31/18. Okay to fill?

## 2018-02-20 NOTE — Telephone Encounter (Signed)
Refill with 3 additional refills   

## 2018-02-21 ENCOUNTER — Encounter: Payer: Self-pay | Admitting: Family Medicine

## 2018-02-22 ENCOUNTER — Encounter: Payer: Self-pay | Admitting: Family Medicine

## 2018-02-22 MED ORDER — TRAMADOL HCL 50 MG PO TABS: ORAL_TABLET | ORAL | 0 refills | Status: DC

## 2018-02-24 MED ORDER — TRAMADOL HCL 50 MG PO TABS: 100.0000 mg | ORAL_TABLET | Freq: Two times a day (BID) | ORAL | 1 refills | Status: DC

## 2018-03-07 ENCOUNTER — Other Ambulatory Visit: Payer: Self-pay | Admitting: Family Medicine

## 2018-03-07 NOTE — Telephone Encounter (Addendum)
Pt states please CANCEL this request for a refill.  Pt found her 2 other refill pages Nothing further needed.

## 2018-03-13 ENCOUNTER — Other Ambulatory Visit: Payer: Self-pay | Admitting: Family Medicine

## 2018-03-28 ENCOUNTER — Other Ambulatory Visit: Payer: Self-pay | Admitting: Family Medicine

## 2018-03-29 NOTE — Telephone Encounter (Signed)
Refill Gabapentin for 6 months.  Refill Restoril once.

## 2018-03-29 NOTE — Telephone Encounter (Signed)
TEMAZEPAM 15 MG CAPSULE Last refill 11/25/17 and last office visit 01/31/18. Okay to refill?

## 2018-04-09 ENCOUNTER — Other Ambulatory Visit: Payer: Self-pay | Admitting: Family Medicine

## 2018-05-15 ENCOUNTER — Other Ambulatory Visit: Payer: Self-pay

## 2018-05-15 MED ORDER — AMPHETAMINE-DEXTROAMPHETAMINE 15 MG PO TABS: 15.0000 mg | ORAL_TABLET | Freq: Two times a day (BID) | ORAL | 0 refills | Status: DC

## 2018-05-15 NOTE — Telephone Encounter (Signed)
Patient brought back her last (1) refill of Adderall because it had been 6 months since it was printed and the pharmacist would not fill it.  1 prescription was printed and given to the patient.

## 2018-05-28 ENCOUNTER — Other Ambulatory Visit: Payer: Self-pay | Admitting: Family Medicine

## 2018-06-09 ENCOUNTER — Other Ambulatory Visit: Payer: Self-pay | Admitting: Family Medicine

## 2018-06-09 NOTE — Telephone Encounter (Signed)
Dr Caryl Never,    Please advise on refill of medication.  Pt was last seen on 01/31/2018 Last refill was 04/02/2018 for #30 with no refills.

## 2018-06-12 NOTE — Telephone Encounter (Signed)
Refill once 

## 2018-06-22 ENCOUNTER — Other Ambulatory Visit: Payer: Self-pay | Admitting: Family Medicine

## 2018-06-22 NOTE — Telephone Encounter (Signed)
Refill with one additional refill. 

## 2018-06-22 NOTE — Telephone Encounter (Signed)
Last refil given on 5/24 for #180 with 1 ref

## 2018-06-23 NOTE — Telephone Encounter (Signed)
Rx done. 

## 2018-06-28 ENCOUNTER — Other Ambulatory Visit: Payer: Self-pay | Admitting: Family Medicine

## 2018-07-12 ENCOUNTER — Encounter: Payer: Self-pay | Admitting: Family Medicine

## 2018-07-13 MED ORDER — AMPHETAMINE-DEXTROAMPHETAMINE 15 MG PO TABS: 15.0000 mg | ORAL_TABLET | Freq: Two times a day (BID) | ORAL | 0 refills | Status: DC

## 2018-07-13 NOTE — Telephone Encounter (Signed)
Refills of Adderall sent to her pharmacy.

## 2018-07-27 ENCOUNTER — Encounter: Payer: Self-pay | Admitting: Family Medicine

## 2018-07-28 ENCOUNTER — Other Ambulatory Visit: Payer: Self-pay

## 2018-07-28 NOTE — Telephone Encounter (Signed)
Pt called to check status

## 2018-08-02 ENCOUNTER — Encounter: Payer: Self-pay | Admitting: Family Medicine

## 2018-08-09 ENCOUNTER — Other Ambulatory Visit: Payer: Self-pay | Admitting: Family Medicine

## 2018-08-09 DIAGNOSIS — M79672 Pain in left foot: Secondary | ICD-10-CM | POA: Diagnosis not present

## 2018-08-09 DIAGNOSIS — M79671 Pain in right foot: Secondary | ICD-10-CM | POA: Diagnosis not present

## 2018-08-11 NOTE — Telephone Encounter (Signed)
Last OV 01/31/2018, No future OV   Baclofen last filled 06/28/18, # 90 with 0 refills  Temazepam last filled 06/12/18, # 30 with 0 refills  Needs OV, OK to fill?

## 2018-08-13 NOTE — Telephone Encounter (Signed)
Refill both for 3 months.  She needs follow up prior to additional refills after that.

## 2018-08-15 ENCOUNTER — Other Ambulatory Visit: Payer: Self-pay | Admitting: Family Medicine

## 2018-08-21 ENCOUNTER — Encounter: Payer: Self-pay | Admitting: Family Medicine

## 2018-09-05 ENCOUNTER — Ambulatory Visit (INDEPENDENT_AMBULATORY_CARE_PROVIDER_SITE_OTHER): Payer: Medicare Other

## 2018-09-05 ENCOUNTER — Other Ambulatory Visit: Payer: Self-pay

## 2018-09-05 ENCOUNTER — Encounter: Payer: Self-pay | Admitting: Family Medicine

## 2018-09-05 ENCOUNTER — Ambulatory Visit: Payer: Medicare Other | Admitting: Family Medicine

## 2018-09-05 VITALS — BP 130/76 | HR 86 | Temp 98.3°F | Wt 175.2 lb

## 2018-09-05 DIAGNOSIS — G6289 Other specified polyneuropathies: Secondary | ICD-10-CM | POA: Diagnosis not present

## 2018-09-05 DIAGNOSIS — M79644 Pain in right finger(s): Secondary | ICD-10-CM

## 2018-09-05 DIAGNOSIS — M19041 Primary osteoarthritis, right hand: Secondary | ICD-10-CM | POA: Diagnosis not present

## 2018-09-05 DIAGNOSIS — G600 Hereditary motor and sensory neuropathy: Secondary | ICD-10-CM

## 2018-09-05 DIAGNOSIS — Z23 Encounter for immunization: Secondary | ICD-10-CM | POA: Diagnosis not present

## 2018-09-05 DIAGNOSIS — R220 Localized swelling, mass and lump, head: Secondary | ICD-10-CM | POA: Diagnosis not present

## 2018-09-05 DIAGNOSIS — F988 Other specified behavioral and emotional disorders with onset usually occurring in childhood and adolescence: Secondary | ICD-10-CM

## 2018-09-05 MED ORDER — AMITRIPTYLINE HCL 25 MG PO TABS: 25.0000 mg | ORAL_TABLET | Freq: Every day | ORAL | 5 refills | Status: DC

## 2018-09-05 NOTE — Patient Instructions (Signed)
Set up follow up for skin lesion excision.

## 2018-09-05 NOTE — Progress Notes (Signed)
Subjective:     Patient ID: Alexandra Norman, female   DOB: 1955/11/11, 62 y.o.   MRN: [id number]  HPI Patient is here to discuss multiple issues as follows.  Chronic problems include history of Charcot-Marie-Tooth disease, chronic peripheral neuropathy pains related to that, hypothyroidism, GERD, osteoarthritis, hyperlipidemia, history of recurrent depression, attention deficit disorder, history of suspected fibromyalgia.  Right ring finger pain.  She was out in [place] vacation in October and fell and thinks she may have hyperflexed the joint at the PIP.  She had some bruising afterwards.  She has some soreness around the joint now and pain with flexion and extension.  She is requesting x-rays  She has chronic neuropathy pains which are fairly severe at times.  She already takes high-dose gabapentin and occasionally supplements with tramadol.  She took Elavil years ago which helped somewhat.  Had previously taken Percocet but is now off opioids and hopes to avoid those possible in the future.  She does have history of recurrent depression.  She thinks a lot of this is seasonal.  She had previous headaches with Cymbalta.  She had some weight gain with Celexa.  Pt has small nodular lesion left temporal area.  Present for several months.  Mild soreness.    Past Medical History:  Diagnosis Date  . ADD (attention deficit disorder)   . Allergy   . Arrhythmia   . Arthritis   . Charcot-Marie-Tooth disease   . Depression   . Fibromyalgia   . Headache(784.0)   . Heart murmur   . Hyperlipidemia   . Osteoarthritis   . Osteoporosis   . Rotator cuff tear   . Thyroid disease   . Ulcer   . UTI (urinary tract infection)    Past Surgical History:  Procedure Laterality Date  . CARDIAC SURGERY  1982   ASD repair   . CARPAL TUNNEL RELEASE  2007   bilateral  . FOOT SURGERY  2001 and 2005   bilateral  . HERNIA REPAIR    . WRIST SURGERY Left 08/12/2017    reports that she has never smoked.  She has never used smokeless tobacco. She reports that she does not drink alcohol or use drugs. family history includes Alcohol abuse in her father and mother; Arthritis in her father and mother; Breast cancer in her sister; Mental illness in her mother and sister; Thyroid disease in her mother. Allergies  Allergen Reactions  . Penicillins     hives  . Statins      Review of Systems  Constitutional: Negative for chills and fever.  Respiratory: Negative for shortness of breath.   Cardiovascular: Negative for chest pain.  Gastrointestinal: Negative for abdominal pain.  Neurological: Negative for weakness.  Psychiatric/Behavioral: Positive for dysphoric mood and sleep disturbance. Negative for suicidal ideas.       Objective:   Physical Exam  Constitutional: She appears well-developed and well-nourished.  Cardiovascular: Normal rate and regular rhythm.  Pulmonary/Chest: Effort normal and breath sounds normal.  Musculoskeletal: She exhibits no edema.  Right ring finger reveals some mild tenderness around the PIP joint.  No visible bruising.  She has fairly good range of motion with extension and flexion at the DIP and PIP joint.  She has some nodular changes of several digits consistent with osteoarthritis.  Skin:  Small (about 7 mm) slightly irregular nodular skin lesion left temporal area.  Slightly irritated surface.  No ulceration.       Assessment:     -#1 right ring  finger pain PIP joint.  Injury about 6 weeks ago.  Will obtain x-ray to rule out fracture  #2 chronic peripheral neuropathy pains related to the Charcot Marie tooth disease-poorly controlled with gabapentin  #3 history of recurrent depression  #4 attention deficit disorder  #5 skin nodule- rule out BCC, rule out early [facility]    Plan:     -X-rays right ring finger -Flu shot given -Discussed adding back low-dose Elavil which she has responded to well in the past.  We did mention cautions for potential side  effects such as dry mouth, constipation, orthostatic issues but her blood pressures have been stable -She was requesting refill of Adderall but actually has 1 refill still left -set up office follow up for shave bx of left face skin lesion  Alexandra CoveyBruce W Peniel Hass MD [facility]

## 2018-09-06 ENCOUNTER — Other Ambulatory Visit: Payer: Self-pay | Admitting: Family Medicine

## 2018-09-08 NOTE — Telephone Encounter (Signed)
Last refill given on 11/11 for #90 with no ref

## 2018-09-11 NOTE — Telephone Encounter (Signed)
Refill with 2 additional refills. 

## 2018-09-12 NOTE — Telephone Encounter (Signed)
Rx done. 

## 2018-09-13 ENCOUNTER — Other Ambulatory Visit: Payer: Self-pay

## 2018-09-13 ENCOUNTER — Ambulatory Visit: Payer: Medicare Other | Admitting: Family Medicine

## 2018-09-13 ENCOUNTER — Encounter: Payer: Self-pay | Admitting: Family Medicine

## 2018-09-13 VITALS — BP 134/74 | HR 83 | Temp 98.4°F | Ht 63.0 in | Wt 176.9 lb

## 2018-09-13 DIAGNOSIS — L989 Disorder of the skin and subcutaneous tissue, unspecified: Secondary | ICD-10-CM

## 2018-09-13 DIAGNOSIS — L820 Inflamed seborrheic keratosis: Secondary | ICD-10-CM

## 2018-09-13 NOTE — Progress Notes (Signed)
  Subjective:     Patient ID: Alexandra BeanCatherine Seubert, female   DOB: 11/07/1955, 62 y.o.   MRN: [id number]  HPI   Procedure only visit.  Patient is here for skin lesion excision left temporal lesion.  This has been present for couple years.  We had noticed this recently at visit.  She has occasional flaking and dryness in this region.  Minimal soreness.  Past Medical History:  Diagnosis Date  . ADD (attention deficit disorder)   . Allergy   . Arrhythmia   . Arthritis   . Charcot-Marie-Tooth disease   . Depression   . Fibromyalgia   . Headache(784.0)   . Heart murmur   . Hyperlipidemia   . Osteoarthritis   . Osteoporosis   . Rotator cuff tear   . Thyroid disease   . Ulcer   . UTI (urinary tract infection)    Past Surgical History:  Procedure Laterality Date  . CARDIAC SURGERY  1982   ASD repair   . CARPAL TUNNEL RELEASE  2007   bilateral  . FOOT SURGERY  2001 and 2005   bilateral  . HERNIA REPAIR    . WRIST SURGERY Left 08/12/2017    reports that she has never smoked. She has never used smokeless tobacco. She reports that she does not drink alcohol or use drugs. family history includes Alcohol abuse in her father and mother; Arthritis in her father and mother; Breast cancer in her sister; Mental illness in her mother and sister; Thyroid disease in her mother. Allergies  Allergen Reactions  . Penicillins     hives  . Statins      Review of Systems  Hematological: Negative for adenopathy.       Objective:   Physical Exam Constitutional:      Appearance: She is well-developed and well-nourished.  Cardiovascular:     Rate and Rhythm: Normal rate and regular rhythm.  Skin:    Comments: Temporal region reveals skin lesion which has slightly fleshy/ elevated base of about 4 mm and slightly hyperkeratotic surface        Assessment:     Left facial skin lesion.  Question early squamous cell carcinoma versus irritated seborrheic keratosis versus other    Plan:      -Discussed risks and benefits of shave excision including risks of bleeding, bruising, scar formation, and infection and patient consented to proceed.  Anesthesia with 1% xylocaine with epinephrine.  Skin prepped  alcohol and shave excision of lesion with #15 blade with minimal bleeding. Used silver nitrate swab to control minimal bleeding   Patient tolerated well.  Vaseline and dressing  Applied. -[facility]  Kristian CoveyBruce W Clara Herbison MD [facility]

## 2018-09-13 NOTE — Patient Instructions (Signed)
Keep wound dry for the first 24 hours then clean daily with soap and water for one week. Apply vaseline daily for 3-4 days. Keep covered with clean dressing for 4-5 days. Follow up promptly for any signs of infection such as redness, warmth, pain, or drainage.  

## 2018-09-14 ENCOUNTER — Encounter: Payer: Self-pay | Admitting: Family Medicine

## 2018-09-15 ENCOUNTER — Other Ambulatory Visit: Payer: Self-pay | Admitting: Family Medicine

## 2018-09-24 ENCOUNTER — Other Ambulatory Visit: Payer: Self-pay | Admitting: Family Medicine

## 2018-09-25 NOTE — Telephone Encounter (Signed)
Refill with one additional refill. 

## 2018-09-25 NOTE — Telephone Encounter (Signed)
Last OV 09/13/18, No future OV  Last filled 06/23/18, # 180 with 1 refill

## 2018-09-28 ENCOUNTER — Other Ambulatory Visit: Payer: Self-pay | Admitting: Family Medicine

## 2018-10-10 ENCOUNTER — Encounter: Payer: Self-pay | Admitting: Family Medicine

## 2018-11-22 ENCOUNTER — Encounter: Payer: Self-pay | Admitting: Family Medicine

## 2018-11-23 MED ORDER — AMPHETAMINE-DEXTROAMPHETAMINE 15 MG PO TABS: 15.0000 mg | ORAL_TABLET | Freq: Two times a day (BID) | ORAL | 0 refills | Status: DC

## 2018-11-23 NOTE — Telephone Encounter (Signed)
Refills of Adderall sent for 3 months. 

## 2018-11-29 ENCOUNTER — Telehealth: Payer: Self-pay | Admitting: *Deleted

## 2018-11-29 NOTE — Telephone Encounter (Signed)
Prior auth for Amphetamine-dextro 15mg  sent to [email or web address] [id number].  Note received which stated the request was approved.  I called [facility] and left a detailed message on the voicemail with this info.

## 2018-12-05 DIAGNOSIS — F332 Major depressive disorder, recurrent severe without psychotic features: Secondary | ICD-10-CM | POA: Diagnosis not present

## 2018-12-06 ENCOUNTER — Other Ambulatory Visit: Payer: Self-pay

## 2018-12-06 ENCOUNTER — Ambulatory Visit: Payer: Medicare Other | Admitting: Family Medicine

## 2018-12-06 ENCOUNTER — Encounter: Payer: Self-pay | Admitting: Family Medicine

## 2018-12-06 VITALS — BP 122/76 | HR 83 | Temp 98.6°F | Ht 63.0 in | Wt 181.2 lb

## 2018-12-06 DIAGNOSIS — F322 Major depressive disorder, single episode, severe without psychotic features: Secondary | ICD-10-CM | POA: Diagnosis not present

## 2018-12-06 MED ORDER — AMITRIPTYLINE HCL 25 MG PO TABS: ORAL_TABLET | ORAL | 3 refills | Status: DC

## 2018-12-06 MED ORDER — BUPROPION HCL ER (XL) 150 MG PO TB24: 150.0000 mg | ORAL_TABLET | Freq: Every day | ORAL | 0 refills | Status: DC

## 2018-12-06 MED ORDER — BUPROPION HCL ER (XL) 300 MG PO TB24: 300.0000 mg | ORAL_TABLET | Freq: Every day | ORAL | 5 refills | Status: DC

## 2018-12-06 NOTE — Progress Notes (Signed)
Subjective:     Patient ID: Alexandra Norman, female   DOB: November 26, 1955, 63 y.o.   MRN: [id number]  HPI Patient here with some recurrent depression symptoms.  She has had recurrent depression for several years.  For several years took Celexa and then tapered herself eventually off.  She states for at least a year now she has had some progressive depression symptoms of decreased motivation, low energy, decreased focus.  No suicidal ideation.    She took Prozac and Zoloft in the past but states she felt "flat ".  Previously took Cymbalta but had daily headaches  She quit working a few years ago and has felt much more isolated.  Does not have a lot of contacts.  She has reinitiated some counseling recently.  Positive family history of suicide in mother and sister.  Past Medical History:  Diagnosis Date  . ADD (attention deficit disorder)   . Allergy   . Arrhythmia   . Arthritis   . Charcot-Marie-Tooth disease   . Depression   . Fibromyalgia   . Headache(784.0)   . Heart murmur   . Hyperlipidemia   . Osteoarthritis   . Osteoporosis   . Rotator cuff tear   . Thyroid disease   . Ulcer   . UTI (urinary tract infection)    Past Surgical History:  Procedure Laterality Date  . CARDIAC SURGERY  1982   ASD repair   . CARPAL TUNNEL RELEASE  2007   bilateral  . FOOT SURGERY  2001 and 2005   bilateral  . HERNIA REPAIR    . WRIST SURGERY Left 08/12/2017    reports that she has never smoked. She has never used smokeless tobacco. She reports that she does not drink alcohol or use drugs. family history includes Alcohol abuse in her father and mother; Arthritis in her father and mother; Breast cancer in her sister; Mental illness in her mother and sister; Thyroid disease in her mother. Allergies  Allergen Reactions  . Penicillins     hives  . Statins        Review of Systems  Respiratory: Negative for shortness of breath.   Cardiovascular: Negative for chest pain.  Neurological:  Negative for headaches.  Psychiatric/Behavioral: Positive for dysphoric mood. Negative for agitation, confusion and suicidal ideas. The patient is not nervous/anxious.        Objective:   Physical Exam Constitutional:      Appearance: Normal appearance.  Cardiovascular:     Rate and Rhythm: Normal rate and regular rhythm.  Pulmonary:     Effort: Pulmonary effort is normal.     Breath sounds: Normal breath sounds.  Neurological:     Mental Status: She is alert.  Psychiatric:        Mood and Affect: Mood normal.        Behavior: Behavior normal.        Thought Content: Thought content normal.     Comments: PHQ 9 score of 20        Assessment:     Major depressive episode, recurrent and severe with PHQ 9 score of 20.  No suicidal ideation    Plan:     -Continue with counseling -Discussed options for treatment.  She has very low energy and low motivation.  We recommend a trial of Wellbutrin XL 150 mg once daily for 1 week then titrate up to 300 mg.  Reassess here in 4 weeks and sooner as needed.  Reviewed potential side effects of medication.  Eulas Post MD [facility] at [facility]

## 2018-12-06 NOTE — Patient Instructions (Signed)
Major Depressive Disorder, Adult  Major depressive disorder (MDD) is a mental health condition. It may also be called clinical depression or unipolar depression. MDD usually causes feelings of sadness, hopelessness, or helplessness. MDD can also cause physical symptoms. It can interfere with work, school, relationships, and other everyday activities. MDD may be mild, moderate, or severe. It may occur once (single episode major depressive disorder) or it may occur multiple times (recurrent major depressive disorder).  What are the causes?  The exact cause of this condition is not known. MDD is most likely caused by a combination of things, which may include:   Genetic factors. These are traits that are passed along from parent to child.   Individual factors. Your personality, your behavior, and the way you handle your thoughts and feelings may contribute to MDD. This includes personality traits and behaviors learned from others.   Physical factors, such as:  ? Differences in the part of your brain that controls emotion. This part of your brain may be different than it is in people who do not have MDD.  ? Long-term (chronic) medical or psychiatric illnesses.   Social factors. Traumatic experiences or major life changes may play a role in the development of MDD.  What increases the risk?  This condition is more likely to develop in women. The following factors may also make you more likely to develop MDD:   A family history of depression.   Troubled family relationships.   Abnormally low levels of certain brain chemicals.   Traumatic events in childhood, especially abuse or the loss of a parent.   Being under a lot of stress, or long-term stress, especially from upsetting life experiences or losses.   A history of:  ? Chronic physical illness.  ? Other mental health disorders.  ? Substance abuse.   Poor living conditions.   Experiencing social exclusion or discrimination on a regular basis.  What are the  signs or symptoms?  The main symptoms of MDD typically include:   Constant depressed or irritable mood.   Loss of interest in things and activities.  MDD symptoms may also include:   Sleeping or eating too much or too little.   Unexplained weight change.   Fatigue or low energy.   Feelings of worthlessness or guilt.   Difficulty thinking clearly or making decisions.   Thoughts of suicide or of harming others.   Physical agitation or weakness.   Isolation.  Severe cases of MDD may also occur with other symptoms, such as:   Delusions or hallucinations, in which you imagine things that are not real (psychotic depression).   Low-level depression that lasts at least a year (chronic depression or persistent depressive disorder).   Extreme sadness and hopelessness (melancholic depression).   Trouble speaking and moving (catatonic depression).  How is this diagnosed?  This condition may be diagnosed based on:   Your symptoms.   Your medical history, including your mental health history. This may involve tests to evaluate your mental health. You may be asked questions about your lifestyle, including any drug and alcohol use, and how long you have had symptoms of MDD.   A physical exam.   Blood tests to rule out other conditions.  You must have a depressed mood and at least four other MDD symptoms most of the day, nearly every day in the same 2-week timeframe before your health care provider can confirm a diagnosis of MDD.  How is this treated?  This condition is   usually treated by mental health professionals, such as psychologists, psychiatrists, and clinical social workers. You may need more than one type of treatment. Treatment may include:   Psychotherapy. This is also called talk therapy or counseling. Types of psychotherapy include:  ? Cognitive behavioral therapy (CBT). This type of therapy teaches you to recognize unhealthy feelings, thoughts, and behaviors, and replace them with positive thoughts  and actions.  ? Interpersonal therapy (IPT). This helps you to improve the way you relate to and communicate with others.  ? Family therapy. This treatment includes members of your family.   Medicine to treat anxiety and depression, or to help you control certain emotions and behaviors.   Lifestyle changes, such as:  ? Limiting alcohol and drug use.  ? Exercising regularly.  ? Getting plenty of sleep.  ? Making healthy eating choices.  ? Spending more time outdoors.    Treatments involving stimulation of the brain can be used in situations with extremely severe symptoms, or when medicine or other therapies do not work over time. These treatments include electroconvulsive therapy, transcranial magnetic stimulation, and vagal nerve stimulation.  Follow these instructions at home:  Activity   Return to your normal activities as told by your health care provider.   Exercise regularly and spend time outdoors as told by your health care provider.  General instructions   Take over-the-counter and prescription medicines only as told by your health care provider.   Do not drink alcohol. If you drink alcohol, limit your alcohol intake to no more than 1 drink a day for nonpregnant women and 2 drinks a day for men. One drink equals 12 oz of beer, 5 oz of wine, or 1 oz of hard liquor. Alcohol can affect any antidepressant medicines you are taking. Talk to your health care provider about your alcohol use.   Eat a healthy diet and get plenty of sleep.   Find activities that you enjoy doing, and make time to do them.   Consider joining a support group. Your health care provider may be able to recommend a support group.   Keep all follow-up visits as told by your health care provider. This is important.  Where to find more information  National Alliance on Mental Illness   www.nami.org  [place]. National Institute of Mental Health   www.nimh.nih.gov  National Suicide Prevention Lifeline   1-800-273-TALK (8255). This is  free, 24-hour help.  Contact a health care provider if:   Your symptoms get worse.   You develop new symptoms.  Get help right away if:   You self-harm.   You have serious thoughts about hurting yourself or others.   You see, hear, taste, smell, or feel things that are not present (hallucinate).  This information is not intended to replace advice given to you by your health care provider. Make sure you discuss any questions you have with your health care provider.  Document Released: 01/15/2013 Document Revised: 05/27/2016 Document Reviewed: 03/31/2016  Elsevier Interactive Patient Education  2019 [employer]

## 2018-12-12 ENCOUNTER — Encounter: Payer: Self-pay | Admitting: Family Medicine

## 2018-12-13 DIAGNOSIS — F332 Major depressive disorder, recurrent severe without psychotic features: Secondary | ICD-10-CM | POA: Diagnosis not present

## 2018-12-25 ENCOUNTER — Encounter: Payer: Self-pay | Admitting: Family Medicine

## 2018-12-26 DIAGNOSIS — F332 Major depressive disorder, recurrent severe without psychotic features: Secondary | ICD-10-CM | POA: Diagnosis not present

## 2018-12-27 ENCOUNTER — Encounter: Payer: Federal, State, Local not specified - PPO | Admitting: Family Medicine

## 2019-01-03 ENCOUNTER — Other Ambulatory Visit: Payer: Self-pay | Admitting: Family Medicine

## 2019-01-03 DIAGNOSIS — F332 Major depressive disorder, recurrent severe without psychotic features: Secondary | ICD-10-CM | POA: Diagnosis not present

## 2019-01-04 NOTE — Telephone Encounter (Signed)
Last OV 12/06/18,  No future OV  Last filled 09/25/18, # 180 with 1 refill

## 2019-01-04 NOTE — Telephone Encounter (Signed)
sent 

## 2019-01-06 ENCOUNTER — Other Ambulatory Visit: Payer: Self-pay | Admitting: Family Medicine

## 2019-01-08 ENCOUNTER — Ambulatory Visit: Payer: Self-pay | Admitting: Family Medicine

## 2019-01-08 NOTE — Telephone Encounter (Signed)
baclofen (LIORESAL) 10 MG tablet Last refill 09/12/18  Last office visit 09/13/18 Okay to fill?

## 2019-01-08 NOTE — Telephone Encounter (Signed)
Refill OK

## 2019-01-11 ENCOUNTER — Ambulatory Visit (INDEPENDENT_AMBULATORY_CARE_PROVIDER_SITE_OTHER): Payer: Medicare Other | Admitting: Family Medicine

## 2019-01-11 ENCOUNTER — Other Ambulatory Visit: Payer: Self-pay

## 2019-01-11 DIAGNOSIS — F3341 Major depressive disorder, recurrent, in partial remission: Secondary | ICD-10-CM

## 2019-01-11 MED ORDER — BUPROPION HCL ER (XL) 300 MG PO TB24: 300.0000 mg | ORAL_TABLET | Freq: Every day | ORAL | 3 refills | Status: DC

## 2019-01-11 NOTE — Progress Notes (Signed)
Patient ID: Alexandra Norman, female   DOB: 1955-10-21, 63 y.o.   MRN: [id number]  Virtual Visit via Video Note  I connected with Alexandra Beanatherine Miklos on 01/11/19 at  3:15 PM EDT by a video enabled telemedicine application and verified that I am speaking with the correct person using two identifiers.  Location patient: home Location provider:work or home office Persons participating in the virtual visit: patient, provider  I discussed the limitations of evaluation and management by telemedicine and the availability of in person appointments. The patient expressed understanding and agreed to proceed.   HPI: Patient is seen for follow-up regarding recent recurrent depression.  She has had recurrent depression for several years.  Refer to previous note.  When she was seen back in March she had PHQ-9 score of 20.  She had some increased lethargy and low motivation and difficulty focusing.  We started Wellbutrin and titrated up to 300 mg.  She has done extremely well on this.  She feels her mood is much improved.  More energy.  Better focus.  She continues to see counselor regularly and that is going well also   ROS: See pertinent positives and negatives per HPI.  Past Medical History:  Diagnosis Date  . ADD (attention deficit disorder)   . Allergy   . Arrhythmia   . Arthritis   . Charcot-Marie-Tooth disease   . Depression   . Fibromyalgia   . Headache(784.0)   . Heart murmur   . Hyperlipidemia   . Osteoarthritis   . Osteoporosis   . Rotator cuff tear   . Thyroid disease   . Ulcer   . UTI (urinary tract infection)     Past Surgical History:  Procedure Laterality Date  . CARDIAC SURGERY  1982   ASD repair   . CARPAL TUNNEL RELEASE  2007   bilateral  . FOOT SURGERY  2001 and 2005   bilateral  . HERNIA REPAIR    . WRIST SURGERY Left 08/12/2017    Family History  Problem Relation Age of Onset  . Alcohol abuse Mother   . Arthritis Mother   . Mental illness Mother        Suicide  in [id number]  . Thyroid disease Mother   . Alcohol abuse Father   . Arthritis Father   . Mental illness Sister        Suicide in [id number]  . Breast cancer Sister     SOCIAL HX: Non-smoker.  No alcohol.   Current Outpatient Medications:  .  amitriptyline (ELAVIL) 25 MG tablet, TAKE 1 TABLET BY MOUTH EVERYDAY AT BEDTIME, Disp: 90 tablet, Rfl: 3 .  amphetamine-dextroamphetamine (ADDERALL) 15 MG tablet, Take 1 tablet by mouth 2 (two) times daily., Disp: 60 tablet, Rfl: 0 .  amphetamine-dextroamphetamine (ADDERALL) 15 MG tablet, Take 1 tablet by mouth 2 (two) times daily., Disp: 60 tablet, Rfl: 0 .  amphetamine-dextroamphetamine (ADDERALL) 15 MG tablet, Take 1 tablet by mouth 2 (two) times daily., Disp: 60 tablet, Rfl: 0 .  aspirin 325 MG tablet, Take 975 mg by mouth every 6 (six) hours as needed., Disp: , Rfl:  .  baclofen (LIORESAL) 10 MG tablet, TAKE 1 TABLET BY MOUTH THREE TIMES A DAY, Disp: 90 tablet, Rfl: 2 .  buPROPion (WELLBUTRIN XL) 300 MG 24 hr tablet, Take 1 tablet (300 mg total) by mouth daily., Disp: 90 tablet, Rfl: 3 .  calcium carbonate (OSCAL) 1500 (600 Ca) MG TABS tablet, Take by mouth., Disp: , Rfl:  .  Coenzyme  Q10 (CO Q10) 100 MG CAPS, Take by mouth., Disp: , Rfl:  .  Cranberry 400 MG CAPS, Take by mouth., Disp: , Rfl:  .  diclofenac sodium (VOLTAREN) 1 % GEL, Apply up to 4 times per day as needed for pain., Disp: 1 Tube, Rfl: 3 .  esomeprazole (NEXIUM) 40 MG capsule, Take 1 capsule (40 mg total) by mouth daily before breakfast., Disp: 90 capsule, Rfl: 3 .  fluticasone (FLONASE) 50 MCG/ACT nasal spray, SPRAY 2 SPRAYS INTO EACH NOSTRIL EVERY DAY, Disp: 16 g, Rfl: 2 .  gabapentin (NEURONTIN) 800 MG tablet, TAKE 1 TABLET BY MOUTH FOUR TIMES A DAY, Disp: 360 tablet, Rfl: 1 .  loratadine (CLARITIN) 10 MG tablet, Take 10 mg by mouth daily., Disp: , Rfl:  .  Multiple Vitamins-Minerals (MULTIVITAMIN & MINERAL PO), Take 1 tablet by mouth daily., Disp: , Rfl:  .  Nutritional Supplements  (ADULT GROWTH HORMONE SUPPORT PO), Take 1 capsule by mouth daily., Disp: , Rfl:  .  ondansetron (ZOFRAN) 4 MG tablet, , Disp: , Rfl:  .  Probiotic Product (PROBIOTIC COLON SUPPORT) CAPS, Take 1 capsule by mouth daily., Disp: , Rfl:  .  temazepam (RESTORIL) 15 MG capsule, TAKE ONE CAPSULE BY MOUTH AT BEDTIME, Disp: 30 capsule, Rfl: 2 .  traMADol (ULTRAM) 50 MG tablet, TAKE 2 TABLETS BY MOUTH TWICE A DAY, Disp: 120 tablet, Rfl: 2 .  triamcinolone cream (KENALOG) 0.1 %, APPLY TO AFFECTED AREA EVERY DAY, Disp: 30 g, Rfl: 0 .  vitamin C (ASCORBIC ACID) 500 MG tablet, Take 500 mg by mouth daily., Disp: , Rfl:  .  VITAMIN D, CHOLECALCIFEROL, PO, Take 2,000 Units by mouth daily. , Disp: , Rfl:   EXAM:  VITALS per patient if applicable:  GENERAL: alert, oriented, appears well and in no acute distress  HEENT: atraumatic, conjunttiva clear, no obvious abnormalities on inspection of external nose and ears  NECK: normal movements of the head and neck  LUNGS: on inspection no signs of respiratory distress, breathing rate appears normal, no obvious gross SOB, gasping or wheezing  CV: no obvious cyanosis  MS: moves all visible extremities without noticeable abnormality  PSYCH/NEURO: pleasant and cooperative, no obvious depression or anxiety, speech and thought processing grossly intact  ASSESSMENT AND PLAN:  Discussed the following assessment and plan:  Recurrent major depression greatly improved on Wellbutrin -Refilled Wellbutrin XL 300 mg 1 daily #90 with 3 refills -She will schedule complete physical when our office is back open and plan to get follow-up labs there including thyroid functions     I discussed the assessment and treatment plan with the patient. The patient was provided an opportunity to ask questions and all were answered. The patient agreed with the plan and demonstrated an understanding of the instructions.   The patient was advised to call back or seek an in-person  evaluation if the symptoms worsen or if the condition fails to improve as anticipated.   Evelena Peat, MD

## 2019-01-13 DIAGNOSIS — F332 Major depressive disorder, recurrent severe without psychotic features: Secondary | ICD-10-CM | POA: Diagnosis not present

## 2019-01-17 DIAGNOSIS — F332 Major depressive disorder, recurrent severe without psychotic features: Secondary | ICD-10-CM | POA: Diagnosis not present

## 2019-01-24 DIAGNOSIS — F332 Major depressive disorder, recurrent severe without psychotic features: Secondary | ICD-10-CM | POA: Diagnosis not present

## 2019-01-30 ENCOUNTER — Ambulatory Visit (INDEPENDENT_AMBULATORY_CARE_PROVIDER_SITE_OTHER): Payer: Medicare Other | Admitting: Family Medicine

## 2019-01-30 ENCOUNTER — Other Ambulatory Visit: Payer: Self-pay

## 2019-01-30 ENCOUNTER — Ambulatory Visit: Payer: Self-pay

## 2019-01-30 DIAGNOSIS — J0100 Acute maxillary sinusitis, unspecified: Secondary | ICD-10-CM | POA: Diagnosis not present

## 2019-01-30 MED ORDER — DOXYCYCLINE HYCLATE 100 MG PO CAPS: 100.0000 mg | ORAL_CAPSULE | Freq: Two times a day (BID) | ORAL | 0 refills | Status: DC

## 2019-01-30 NOTE — Telephone Encounter (Signed)
Appointment schedule with Dr Caryl Never 01/30/2019 at 1:45 pm.

## 2019-01-30 NOTE — Telephone Encounter (Signed)
Pt c/o 5 day h/o swelling to right side of face to the right temple. Pt stated she looks like a "chipmunk." Pt stated that she is having moderate sharp pain with chewing and when not chewing food, she is experiencing mild constant dull pain. Pt also c/o moderate headache, post nasal drip. When pt clears throat, the phlegm is yellow green.  Pt denies fever or cough. Pt also c/o right ear pressure. Care advice given and pt verbalized understanding. Pt's e-mail verified and warm transferred to [facility] to make virtual appt.        Reason for Disposition . [1] Mild facial swelling (puffiness) AND [2] persists > 3 days  Answer Assessment - Initial Assessment Questions 1. ONSET: "When did the swelling start?" (e.g., minutes, hours, days)     5 days ago 2. LOCATION: "What part of the face is swollen?"     Right side of face to the right temple 3. SEVERITY: "How swollen is it?"     "looks like  Chipmunk" eye is not swollen shut 4. ITCHING: "Is there any itching?" If so, ask: "How much?"   (Scale 1-10; mild, moderate or severe)     no 5. PAIN: "Is the swelling painful to touch?" If so, ask: "How painful is it?"   (Scale 1-10; mild, moderate or severe)     Sharp pain with eating (moderate pin) dull pain mild constant 6. FEVER: "Do you have a fever?" If so, ask: "What is it, how was it measured, and when did it start?"      no 7. CAUSE: "What do you think is causing the face swelling?"     sinusitis 8. RECURRENT SYMPTOM: "Have you had face swelling before?" If so, ask: "When was the last time?" "What happened that time?"     no 9. OTHER SYMPTOMS: "Do you have any other symptoms?" (e.g., toothache, leg swelling)     Headache constant (5/10), pressure to the right ear constant- post nasal drip (yellow greenish color) 10. PREGNANCY: "Is there any chance you are pregnant?" "When was your last menstrual period?"       n/a  Protocols used: [facility]

## 2019-01-30 NOTE — Progress Notes (Signed)
Patient ID: Alexandra Norman, female   DOB: 04-Oct-1956, 63 y.o.   MRN: [id number]  This visit type was conducted due to national recommendations for restrictions regarding the COVID-19 pandemic in an effort to limit this patient's exposure and mitigate transmission in our community.   Virtual Visit via Video Note  I connected with  on 01/30/19 at  1:45 PM EDT by a video enabled telemedicine application and verified that I am speaking with the correct person using two identifiers.  Location patient: home Location provider:work or home office Persons participating in the virtual visit: patient, provider  I discussed the limitations of evaluation and management by telemedicine and the availability of in person appointments. The patient expressed understanding and agreed to proceed.   HPI: Patient is seen with concern for acute sinusitis.  She states she has had some right maxillary facial swelling past several days.  She has had some postnasal drainage and nasal stuffiness and has had some greenish nasal discharge.  Occasional blood-tinged mucus.  She had sinus headache for several weeks.  No significant cough.  No relief with over-the-counter medications.  She has penicillin allergy.  She has not had any dental pain.   ROS: See pertinent positives and negatives per HPI.  Past Medical History:  Diagnosis Date  . ADD (attention deficit disorder)   . Allergy   . Arrhythmia   . Arthritis   . Charcot-Marie-Tooth disease   . Depression   . Fibromyalgia   . Headache(784.0)   . Heart murmur   . Hyperlipidemia   . Osteoarthritis   . Osteoporosis   . Rotator cuff tear   . Thyroid disease   . Ulcer   . UTI (urinary tract infection)     Past Surgical History:  Procedure Laterality Date  . CARDIAC SURGERY  1982   ASD repair   . CARPAL TUNNEL RELEASE  2007   bilateral  . FOOT SURGERY  2001 and 2005   bilateral  . HERNIA REPAIR    . WRIST SURGERY Left 08/12/2017    Family History   Problem Relation Age of Onset  . Alcohol abuse Mother   . Arthritis Mother   . Mental illness Mother        Suicide in 60  . Thyroid disease Mother   . Alcohol abuse Father   . Arthritis Father   . Mental illness Sister        Suicide in 33  . Breast cancer Sister     SOCIAL HX: Non-smoker   Current Outpatient Medications:  .  amitriptyline (ELAVIL) 25 MG tablet, TAKE 1 TABLET BY MOUTH EVERYDAY AT BEDTIME, Disp: 90 tablet, Rfl: 3 .  amphetamine-dextroamphetamine (ADDERALL) 15 MG tablet, Take 1 tablet by mouth 2 (two) times daily., Disp: 60 tablet, Rfl: 0 .  amphetamine-dextroamphetamine (ADDERALL) 15 MG tablet, Take 1 tablet by mouth 2 (two) times daily., Disp: 60 tablet, Rfl: 0 .  amphetamine-dextroamphetamine (ADDERALL) 15 MG tablet, Take 1 tablet by mouth 2 (two) times daily., Disp: 60 tablet, Rfl: 0 .  aspirin 325 MG tablet, Take 975 mg by mouth every 6 (six) hours as needed., Disp: , Rfl:  .  baclofen (LIORESAL) 10 MG tablet, TAKE 1 TABLET BY MOUTH THREE TIMES A DAY, Disp: 90 tablet, Rfl: 2 .  buPROPion (WELLBUTRIN XL) 300 MG 24 hr tablet, Take 1 tablet (300 mg total) by mouth daily., Disp: 90 tablet, Rfl: 3 .  calcium carbonate (OSCAL) 1500 (600 Ca) MG TABS tablet, Take by mouth.,  Disp: , Rfl:  .  Coenzyme Q10 (CO Q10) 100 MG CAPS, Take by mouth., Disp: , Rfl:  .  Cranberry 400 MG CAPS, Take by mouth., Disp: , Rfl:  .  diclofenac sodium (VOLTAREN) 1 % GEL, Apply up to 4 times per day as needed for pain., Disp: 1 Tube, Rfl: 3 .  doxycycline (VIBRAMYCIN) 100 MG capsule, Take 1 capsule (100 mg total) by mouth 2 (two) times daily., Disp: 20 capsule, Rfl: 0 .  esomeprazole (NEXIUM) 40 MG capsule, Take 1 capsule (40 mg total) by mouth daily before breakfast., Disp: 90 capsule, Rfl: 3 .  fluticasone (FLONASE) 50 MCG/ACT nasal spray, SPRAY 2 SPRAYS INTO EACH NOSTRIL EVERY DAY, Disp: 16 g, Rfl: 2 .  gabapentin (NEURONTIN) 800 MG tablet, TAKE 1 TABLET BY MOUTH FOUR TIMES A DAY, Disp:  360 tablet, Rfl: 1 .  loratadine (CLARITIN) 10 MG tablet, Take 10 mg by mouth daily., Disp: , Rfl:  .  Multiple Vitamins-Minerals (MULTIVITAMIN & MINERAL PO), Take 1 tablet by mouth daily., Disp: , Rfl:  .  Nutritional Supplements (ADULT GROWTH HORMONE SUPPORT PO), Take 1 capsule by mouth daily., Disp: , Rfl:  .  ondansetron (ZOFRAN) 4 MG tablet, , Disp: , Rfl:  .  Probiotic Product (PROBIOTIC COLON SUPPORT) CAPS, Take 1 capsule by mouth daily., Disp: , Rfl:  .  temazepam (RESTORIL) 15 MG capsule, TAKE ONE CAPSULE BY MOUTH AT BEDTIME, Disp: 30 capsule, Rfl: 2 .  traMADol (ULTRAM) 50 MG tablet, TAKE 2 TABLETS BY MOUTH TWICE A DAY, Disp: 120 tablet, Rfl: 2 .  triamcinolone cream (KENALOG) 0.1 %, APPLY TO AFFECTED AREA EVERY DAY, Disp: 30 g, Rfl: 0 .  vitamin C (ASCORBIC ACID) 500 MG tablet, Take 500 mg by mouth daily., Disp: , Rfl:  .  VITAMIN D, CHOLECALCIFEROL, PO, Take 2,000 Units by mouth daily. , Disp: , Rfl:   EXAM:  VITALS per patient if applicable:  GENERAL: alert, oriented, appears well and in no acute distress  HEENT: atraumatic, conjunttiva clear, no obvious abnormalities on inspection of external nose and ears  NECK: normal movements of the head and neck  LUNGS: on inspection no signs of respiratory distress, breathing rate appears normal, no obvious gross SOB, gasping or wheezing  CV: no obvious cyanosis  MS: moves all visible extremities without noticeable abnormality  PSYCH/NEURO: pleasant and cooperative, no obvious depression or anxiety, speech and thought processing grossly intact  ASSESSMENT AND PLAN:  Discussed the following assessment and plan:  Acute right maxillary sinusitis -She will try some over-the-counter Mucinex -Doxycycline 100 mg twice daily with food.  She is cautioned about sun sensitivity with this. -Touch base for any persistent or worsening symptoms     I discussed the assessment and treatment plan with the patient. The patient was provided  an opportunity to ask questions and all were answered. The patient agreed with the plan and demonstrated an understanding of the instructions.   The patient was advised to call back or seek an in-person evaluation if the symptoms worsen or if the condition fails to improve as anticipated   Evelena PeatBruce Demiyah Fischbach, MD

## 2019-01-31 DIAGNOSIS — F332 Major depressive disorder, recurrent severe without psychotic features: Secondary | ICD-10-CM | POA: Diagnosis not present

## 2019-02-07 DIAGNOSIS — F332 Major depressive disorder, recurrent severe without psychotic features: Secondary | ICD-10-CM | POA: Diagnosis not present

## 2019-02-14 DIAGNOSIS — F332 Major depressive disorder, recurrent severe without psychotic features: Secondary | ICD-10-CM | POA: Diagnosis not present

## 2019-02-16 ENCOUNTER — Other Ambulatory Visit: Payer: Self-pay

## 2019-02-16 ENCOUNTER — Encounter: Payer: Self-pay | Admitting: Family Medicine

## 2019-02-16 MED ORDER — AMITRIPTYLINE HCL 25 MG PO TABS: ORAL_TABLET | ORAL | 2 refills | Status: DC

## 2019-02-21 DIAGNOSIS — F332 Major depressive disorder, recurrent severe without psychotic features: Secondary | ICD-10-CM | POA: Diagnosis not present

## 2019-02-28 DIAGNOSIS — F332 Major depressive disorder, recurrent severe without psychotic features: Secondary | ICD-10-CM | POA: Diagnosis not present

## 2019-03-07 DIAGNOSIS — F332 Major depressive disorder, recurrent severe without psychotic features: Secondary | ICD-10-CM | POA: Diagnosis not present

## 2019-03-08 ENCOUNTER — Ambulatory Visit: Payer: Federal, State, Local not specified - PPO | Admitting: Internal Medicine

## 2019-03-08 ENCOUNTER — Ambulatory Visit: Payer: Self-pay | Admitting: *Deleted

## 2019-03-08 ENCOUNTER — Other Ambulatory Visit: Payer: Self-pay | Admitting: Family Medicine

## 2019-03-08 NOTE — Telephone Encounter (Signed)
Appt scheduled

## 2019-03-08 NOTE — Telephone Encounter (Signed)
Patient calls report she wheezes when she bends over. Feels she isn't getting a deep breath at times.Denies any breathing distress at this time.  Had a cough last night none today. No fever. Body aches due to an atrophy condition.-not new.dizziness-due to the right-sided atrophy, not new. Requesting to have covid19 test. Warm transferred to PCP.   Answer Assessment - Initial Assessment Questions 1. RESPIRATORY STATUS: "Describe your breathing?" (e.g., wheezing, shortness of breath, unable to speak, severe coughing)     Hears wheezing when she bends over.  2. ONSET: "When did this breathing problem begin?"      3 days ago. 3. PATTERN "Does the difficult breathing come and go, or has it been constant since it started?"      Comes and goes with activity 4. SEVERITY: "How bad is your breathing?" (e.g., mild, moderate, severe)    - MILD: No SOB at rest, mild SOB with walking, speaks normally in sentences, can lay down, no retractions, pulse < 100.    - MODERATE: SOB at rest, SOB with minimal exertion and prefers to sit, cannot lie down flat, speaks in phrases, mild retractions, audible wheezing, pulse 100-120.    - SEVERE: Very SOB at rest, speaks in single words, struggling to breathe, sitting hunched forward, retractions, pulse > 120   Not able to take a deep breath. 5. RECURRENT SYMPTOM: "Have you had difficulty breathing before?" If so, ask: "When was the last time?" and "What happened that time?"      no 6. CARDIAC HISTORY: "Do you have any history of heart disease?" (e.g., heart attack, angina, bypass surgery, angioplasty)      High cholesterol 7. LUNG HISTORY: "Do you have any history of lung disease?"  (e.g., pulmonary embolus, asthma, emphysema)     no 8. CAUSE: "What do you think is causing the breathing problem?"      unsure 9. OTHER SYMPTOMS: "Do you have any other symptoms? (e.g., dizziness, runny nose, cough, chest pain, fever)     Cough yesterday.  10. PREGNANCY: "Is there any  chance you are pregnant?" "When was your last menstrual period?"       na 11. TRAVEL: "Have you traveled out of the country in the last month?" (e.g., travel history, exposures)       no  Protocols used: BREATHING DIFFICULTY-A-AH

## 2019-03-09 ENCOUNTER — Other Ambulatory Visit: Payer: Self-pay

## 2019-03-09 ENCOUNTER — Telehealth: Payer: Self-pay | Admitting: *Deleted

## 2019-03-09 ENCOUNTER — Ambulatory Visit (INDEPENDENT_AMBULATORY_CARE_PROVIDER_SITE_OTHER): Payer: Medicare Other | Admitting: Internal Medicine

## 2019-03-09 DIAGNOSIS — R6889 Other general symptoms and signs: Secondary | ICD-10-CM | POA: Diagnosis not present

## 2019-03-09 DIAGNOSIS — Z20822 Contact with and (suspected) exposure to covid-19: Secondary | ICD-10-CM

## 2019-03-09 NOTE — Progress Notes (Signed)
Virtual Visit via Video Note  I connected with Alexandra Norman on 03/09/19 at  9:30 AM EDT by a video enabled telemedicine application and verified that I am speaking with the correct person using two identifiers.  Location patient: home Location provider: work office Persons participating in the virtual visit: patient, provider  I discussed the limitations of evaluation and management by telemedicine and the availability of in person appointments. The patient expressed understanding and agreed to proceed.   HPI: Alexandra Norman has scheduled this visit to discuss some acute complaints.  2 nights ago she started experiencing a dry cough that has now led to wheezing, she is having a difficult time controlling the cough.  She feels like she cannot take a deep breath, she also has some mild shortness of breath and dyspnea on exertion although she admits to being obese and out of shape and always has some mild dyspnea on exertion.  She has not had any fever or known sick contacts.  She usually is isolated at home, has groceries delivered.  However 2 weeks ago she helped her sister tour apartments in [place] and some people were not wearing masks. She does not feel like she needs to go to the emergency department today.  She does not have any chronic lung issues although she does have a congenital ASD that was repaired in childhood, she also has Charcot-Marie-Tooth disease that has led to muscle weakness and atrophy on the right side of her body.  Her best friend is an Char[employer] fundraiser and encouraged her to call her doctor today.   ROS: Constitutional: Denies fever, chills, diaphoresis, appetite change and fatigue.  HEENT: Denies photophobia, eye pain, redness, hearing loss, ear pain,  mouth sores, trouble swallowing, neck pain, neck stiffness and tinnitus.   Respiratory: Denies chest tightness. Cardiovascular: Denies chest pain, palpitations and leg swelling.  Gastrointestinal: Denies nausea, vomiting,  abdominal pain, diarrhea, constipation, blood in stool and abdominal distention.  Genitourinary: Denies dysuria, urgency, frequency, hematuria, flank pain and difficulty urinating.  Endocrine: Denies: hot or cold intolerance, sweats, changes in hair or nails, polyuria, polydipsia. Musculoskeletal: Denies myalgias, back pain, joint swelling, arthralgias and gait problem.  Skin: Denies pallor, rash and wound.  Neurological: Denies dizziness, seizures, syncope, weakness, light-headedness, numbness and headaches.  Hematological: Denies adenopathy. Easy bruising, personal or family bleeding history  Psychiatric/Behavioral: Denies suicidal ideation, mood changes, confusion, nervousness, sleep disturbance and agitation   Past Medical History:  Diagnosis Date  . ADD (attention deficit disorder)   . Allergy   . Arrhythmia   . Arthritis   . Charcot-Marie-Tooth disease   . Depression   . Fibromyalgia   . Headache(784.0)   . Heart murmur   . Hyperlipidemia   . Osteoarthritis   . Osteoporosis   . Rotator cuff tear   . Thyroid disease   . Ulcer   . UTI (urinary tract infection)     Past Surgical History:  Procedure Laterality Date  . CARDIAC SURGERY  1982   ASD repair   . CARPAL TUNNEL RELEASE  2007   bilateral  . FOOT SURGERY  2001 and 2005   bilateral  . HERNIA REPAIR    . WRIST SURGERY Left 08/12/2017    Family History  Problem Relation Age of Onset  . Alcohol abuse Mother   . Arthritis Mother   . Mental illness Mother        Suicide in 56  . Thyroid disease Mother   . Alcohol abuse Father   .  Arthritis Father   . Mental illness Sister        Suicide in [id number]  . Breast cancer Sister     SOCIAL HX:   reports that she has never smoked. She has never used smokeless tobacco. She reports that she does not drink alcohol or use drugs.   Current Outpatient Medications:  .  amitriptyline (ELAVIL) 25 MG tablet, TAKE 2 TABLETS BY MOUTH EVERYDAY AT BEDTIME, Disp: 180 tablet,  Rfl: 2 .  amphetamine-dextroamphetamine (ADDERALL) 15 MG tablet, Take 1 tablet by mouth 2 (two) times daily., Disp: 60 tablet, Rfl: 0 .  amphetamine-dextroamphetamine (ADDERALL) 15 MG tablet, Take 1 tablet by mouth 2 (two) times daily., Disp: 60 tablet, Rfl: 0 .  amphetamine-dextroamphetamine (ADDERALL) 15 MG tablet, Take 1 tablet by mouth 2 (two) times daily., Disp: 60 tablet, Rfl: 0 .  aspirin 325 MG tablet, Take 975 mg by mouth every 6 (six) hours as needed., Disp: , Rfl:  .  baclofen (LIORESAL) 10 MG tablet, TAKE 1 TABLET BY MOUTH THREE TIMES A DAY, Disp: 90 tablet, Rfl: 2 .  buPROPion (WELLBUTRIN XL) 300 MG 24 hr tablet, Take 1 tablet (300 mg total) by mouth daily., Disp: 90 tablet, Rfl: 3 .  calcium carbonate (OSCAL) 1500 (600 Ca) MG TABS tablet, Take by mouth., Disp: , Rfl:  .  Coenzyme Q10 (CO Q10) 100 MG CAPS, Take by mouth., Disp: , Rfl:  .  Cranberry 400 MG CAPS, Take by mouth., Disp: , Rfl:  .  diclofenac sodium (VOLTAREN) 1 % GEL, Apply up to 4 times per day as needed for pain., Disp: 1 Tube, Rfl: 3 .  doxycycline (VIBRAMYCIN) 100 MG capsule, Take 1 capsule (100 mg total) by mouth 2 (two) times daily., Disp: 20 capsule, Rfl: 0 .  esomeprazole (NEXIUM) 40 MG capsule, Take 1 capsule (40 mg total) by mouth daily before breakfast., Disp: 90 capsule, Rfl: 3 .  fluticasone (FLONASE) 50 MCG/ACT nasal spray, SPRAY 2 SPRAYS INTO EACH NOSTRIL EVERY DAY, Disp: 16 g, Rfl: 2 .  gabapentin (NEURONTIN) 800 MG tablet, TAKE 1 TABLET BY MOUTH FOUR TIMES A DAY, Disp: 360 tablet, Rfl: 1 .  loratadine (CLARITIN) 10 MG tablet, Take 10 mg by mouth daily., Disp: , Rfl:  .  Multiple Vitamins-Minerals (MULTIVITAMIN & MINERAL PO), Take 1 tablet by mouth daily., Disp: , Rfl:  .  Nutritional Supplements (ADULT GROWTH HORMONE SUPPORT PO), Take 1 capsule by mouth daily., Disp: , Rfl:  .  ondansetron (ZOFRAN) 4 MG tablet, , Disp: , Rfl:  .  Probiotic Product (PROBIOTIC COLON SUPPORT) CAPS, Take 1 capsule by mouth  daily., Disp: , Rfl:  .  temazepam (RESTORIL) 15 MG capsule, TAKE ONE CAPSULE BY MOUTH AT BEDTIME, Disp: 30 capsule, Rfl: 2 .  traMADol (ULTRAM) 50 MG tablet, TAKE 2 TABLETS BY MOUTH TWICE A DAY, Disp: 120 tablet, Rfl: 2 .  triamcinolone cream (KENALOG) 0.1 %, APPLY TO AFFECTED AREA EVERY DAY, Disp: 30 g, Rfl: 0 .  vitamin C (ASCORBIC ACID) 500 MG tablet, Take 500 mg by mouth daily., Disp: , Rfl:  .  VITAMIN D, CHOLECALCIFEROL, PO, Take 2,000 Units by mouth daily. , Disp: , Rfl:   EXAM:   VITALS per patient if applicable: None reported  GENERAL: alert, oriented, appears well and in no acute distress  HEENT: atraumatic, conjunttiva clear, no obvious abnormalities on inspection of external nose and ears  NECK: normal movements of the head and neck  LUNGS: on inspection no signs  of respiratory distress, breathing rate appears normal, no obvious gross increased work of breathing, gasping or wheezing  CV: no obvious cyanosis  MS: moves all visible extremities without noticeable abnormality  PSYCH/NEURO: pleasant and cooperative, no obvious depression or anxiety, speech and thought processing grossly intact  ASSESSMENT AND PLAN:   Suspected Covid-19 Virus Infection -Due to current COVID-19 pandemic, patient's symptoms raise suspicion for infection particularly as 2 weeks ago she had multiple  contacts with people who were not wearing masks. -Will arrange for her to get tested. -I have advised her that I have a very low threshold for her going to the emergency department given her multiple medical issues as well as the fact that she is already experiencing some mild shortness of breath.  She does not feel like she needs to go today but acknowledges my recommendation and advice. -Will forward this note to her PCP so she can be followed up on next week.    I discussed the assessment and treatment plan with the patient. The patient was provided an opportunity to ask questions and all were  answered. The patient agreed with the plan and demonstrated an understanding of the instructions.   The patient was advised to call back or seek an in-person evaluation if the symptoms worsen or if the condition fails to improve as anticipated.    Chaya Jan, MD  [facility]

## 2019-03-09 NOTE — Telephone Encounter (Signed)
Patient called back to schedule COVID  [phone]

## 2019-03-09 NOTE — Addendum Note (Signed)
Addended by: Amado Coe on: 03/09/2019 11:26 AM   Modules accepted: Orders

## 2019-03-09 NOTE — Telephone Encounter (Signed)
Pt called and scheduled for testing at [facility] site on 03/09/19 at 1:15 pm. Pt advised to wear a mask for appt and to remain in car. Understanding verbalized.

## 2019-03-09 NOTE — Telephone Encounter (Signed)
Attempted to contact pt to arrange COVID testing; left message on voicemail.

## 2019-03-12 ENCOUNTER — Telehealth: Payer: Self-pay | Admitting: Family Medicine

## 2019-03-12 LAB — NOVEL CORONAVIRUS, NAA: SARS-CoV-2, NAA: NOT DETECTED

## 2019-03-12 NOTE — Telephone Encounter (Signed)
Spoke with patient to follow up.  She had some "wheezing" over the weekend but overall stable.  No fever.  Covid 19 screen still pending.   She feels "better" overall today.

## 2019-03-14 DIAGNOSIS — F332 Major depressive disorder, recurrent severe without psychotic features: Secondary | ICD-10-CM | POA: Diagnosis not present

## 2019-03-21 DIAGNOSIS — F332 Major depressive disorder, recurrent severe without psychotic features: Secondary | ICD-10-CM | POA: Diagnosis not present

## 2019-04-02 ENCOUNTER — Other Ambulatory Visit: Payer: Self-pay | Admitting: Family Medicine

## 2019-04-04 ENCOUNTER — Other Ambulatory Visit: Payer: Self-pay | Admitting: Family Medicine

## 2019-04-04 DIAGNOSIS — F332 Major depressive disorder, recurrent severe without psychotic features: Secondary | ICD-10-CM | POA: Diagnosis not present

## 2019-04-12 DIAGNOSIS — F319 Bipolar disorder, unspecified: Secondary | ICD-10-CM | POA: Diagnosis not present

## 2019-04-13 DIAGNOSIS — F319 Bipolar disorder, unspecified: Secondary | ICD-10-CM | POA: Diagnosis not present

## 2019-04-14 ENCOUNTER — Other Ambulatory Visit: Payer: Self-pay | Admitting: Family Medicine

## 2019-04-16 NOTE — Telephone Encounter (Signed)
Last OV 03/09/19 with Dr. Jerilee Hoh, No future OV  Last filled 01/04/19, # 120 with 2 refills

## 2019-04-19 DIAGNOSIS — F332 Major depressive disorder, recurrent severe without psychotic features: Secondary | ICD-10-CM | POA: Diagnosis not present

## 2019-04-19 DIAGNOSIS — F319 Bipolar disorder, unspecified: Secondary | ICD-10-CM | POA: Diagnosis not present

## 2019-05-24 LAB — HM DIABETES EYE EXAM

## 2019-07-06 ENCOUNTER — Other Ambulatory Visit: Payer: Self-pay | Admitting: Family Medicine

## 2019-07-09 ENCOUNTER — Other Ambulatory Visit: Payer: Self-pay

## 2019-07-09 ENCOUNTER — Encounter: Payer: Self-pay | Admitting: Family Medicine

## 2019-07-09 ENCOUNTER — Ambulatory Visit (INDEPENDENT_AMBULATORY_CARE_PROVIDER_SITE_OTHER): Payer: Medicare Other | Admitting: Family Medicine

## 2019-07-09 VITALS — BP 120/58 | HR 78 | Temp 97.7°F | Wt 184.5 lb

## 2019-07-09 DIAGNOSIS — E785 Hyperlipidemia, unspecified: Secondary | ICD-10-CM

## 2019-07-09 DIAGNOSIS — R739 Hyperglycemia, unspecified: Secondary | ICD-10-CM | POA: Insufficient documentation

## 2019-07-09 DIAGNOSIS — R7989 Other specified abnormal findings of blood chemistry: Secondary | ICD-10-CM | POA: Diagnosis not present

## 2019-07-09 DIAGNOSIS — H8111 Benign paroxysmal vertigo, right ear: Secondary | ICD-10-CM

## 2019-07-09 DIAGNOSIS — Z23 Encounter for immunization: Secondary | ICD-10-CM

## 2019-07-09 NOTE — Patient Instructions (Signed)
Benign Positional Vertigo Vertigo is the feeling that you or your surroundings are moving when they are not. Benign positional vertigo is the most common form of vertigo. This is usually a harmless condition (benign). This condition is positional. This means that symptoms are triggered by certain movements and positions. This condition can be dangerous if it occurs while you are doing something that could cause harm to you or others. This includes activities such as driving or operating machinery. What are the causes? In many cases, the cause of this condition is not known. It may be caused by a disturbance in an area of the inner ear that helps your brain to sense movement and balance. This disturbance can be caused by:  Viral infection (labyrinthitis).  Head injury.  Repetitive motion, such as jumping, dancing, or running. What increases the risk? You are more likely to develop this condition if:  You are a woman.  You are 50 years of age or older. What are the signs or symptoms? Symptoms of this condition usually happen when you move your head or your eyes in different directions. Symptoms may start suddenly, and usually last for less than a minute. They include:  Loss of balance and falling.  Feeling like you are spinning or moving.  Feeling like your surroundings are spinning or moving.  Nausea and vomiting.  Blurred vision.  Dizziness.  Involuntary eye movement (nystagmus). Symptoms can be mild and cause only minor problems, or they can be severe and interfere with daily life. Episodes of benign positional vertigo may return (recur) over time. Symptoms may improve over time. How is this diagnosed? This condition may be diagnosed based on:  Your medical history.  Physical exam of the head, neck, and ears.  Tests, such as: ? MRI. ? CT scan. ? Eye movement tests. Your health care provider may ask you to change positions quickly while he or she watches you for symptoms  of benign positional vertigo, such as nystagmus. Eye movement may be tested with a variety of exams that are designed to evaluate or stimulate vertigo. ? An electroencephalogram (EEG). This records electrical activity in your brain. ? Hearing tests. You may be referred to a health care provider who specializes in ear, nose, and throat (ENT) problems (otolaryngologist) or a provider who specializes in disorders of the nervous system (neurologist). How is this treated?  This condition may be treated in a session in which your health care provider moves your head in specific positions to adjust your inner ear back to normal. Treatment for this condition may take several sessions. Surgery may be needed in severe cases, but this is rare. In some cases, benign positional vertigo may resolve on its own in 2-4 weeks. Follow these instructions at home: Safety  Move slowly. Avoid sudden body or head movements or certain positions, as told by your health care provider.  Avoid driving until your health care provider says it is safe for you to do so.  Avoid operating heavy machinery until your health care provider says it is safe for you to do so.  Avoid doing any tasks that would be dangerous to you or others if vertigo occurs.  If you have trouble walking or keeping your balance, try using a cane for stability. If you feel dizzy or unstable, sit down right away.  Return to your normal activities as told by your health care provider. Ask your health care provider what activities are safe for you. General instructions  Take over-the-counter   and prescription medicines only as told by your health care provider.  Drink enough fluid to keep your urine pale yellow.  Keep all follow-up visits as told by your health care provider. This is important. Contact a health care provider if:  You have a fever.  Your condition gets worse or you develop new symptoms.  Your family or friends notice any  behavioral changes.  You have nausea or vomiting that gets worse.  You have numbness or a "pins and needles" sensation. Get help right away if you:  Have difficulty speaking or moving.  Are always dizzy.  Faint.  Develop severe headaches.  Have weakness in your legs or arms.  Have changes in your hearing or vision.  Develop a stiff neck.  Develop sensitivity to light. Summary  Vertigo is the feeling that you or your surroundings are moving when they are not. Benign positional vertigo is the most common form of vertigo.  The cause of this condition is not known. It may be caused by a disturbance in an area of the inner ear that helps your brain to sense movement and balance.  Symptoms include loss of balance and falling, feeling that you or your surroundings are moving, nausea and vomiting, and blurred vision.  This condition can be diagnosed based on symptoms, physical exam, and other tests, such as MRI, CT scan, eye movement tests, and hearing tests.  Follow safety instructions as told by your health care provider. You will also be told when to contact your health care provider in case of problems. This information is not intended to replace advice given to you by your health care provider. Make sure you discuss any questions you have with your health care provider. Document Released: 06/28/2006 Document Revised: 03/01/2018 Document Reviewed: 03/01/2018 Elsevier Patient Education  2020 [employer]  

## 2019-07-09 NOTE — Progress Notes (Signed)
Subjective:     Patient ID: Alexandra Norman, female   DOB: 31-Jul-1956, 63 y.o.   MRN: [id number]  HPI Patient is here for medical follow-up.  She recently saw psychiatrist and had multiple medication changes.  She was taken off Adderall and amitriptyline and started on Lamictal and Seroquel.  She states that she feels her depression is doing better.  Her initiative is much improved.  Her major complaint today is that of intermittent dizziness.  By description sounds like vertigo.  Her symptoms are worse to the right side here in the office today.  She has not had any speech changes, dysphagia, or any ataxia.  No focal weakness. No consistent headaches.  She had multiple recent labs done per psychiatry and had A1c of 6.1% TSH 4.8 and severely elevated lipids.  Intolerant to multiple statins previously.  She has essential tremor upper extremities which is unchanged.  Past Medical History:  Diagnosis Date  . ADD (attention deficit disorder)   . Allergy   . Arrhythmia   . Arthritis   . Charcot-Marie-Tooth disease   . Depression   . Fibromyalgia   . Headache(784.0)   . Heart murmur   . Hyperlipidemia   . Osteoarthritis   . Osteoporosis   . Rotator cuff tear   . Thyroid disease   . Ulcer   . UTI (urinary tract infection)    Past Surgical History:  Procedure Laterality Date  . CARDIAC SURGERY  1982   ASD repair   . CARPAL TUNNEL RELEASE  2007   bilateral  . FOOT SURGERY  2001 and 2005   bilateral  . HERNIA REPAIR    . WRIST SURGERY Left 08/12/2017    reports that she has never smoked. She has never used smokeless tobacco. She reports that she does not drink alcohol or use drugs. family history includes Alcohol abuse in her father and mother; Arthritis in her father and mother; Breast cancer in her sister; Mental illness in her mother and sister; Thyroid disease in her mother. Allergies  Allergen Reactions  . Penicillins     hives  . Statins      Review of Systems   Constitutional: Negative for chills, fever and unexpected weight change.  Eyes: Negative for visual disturbance.  Respiratory: Negative for cough, chest tightness, shortness of breath and wheezing.   Cardiovascular: Negative for chest pain, palpitations and leg swelling.  Neurological: Positive for dizziness. Negative for seizures, syncope, weakness, light-headedness and headaches.  Psychiatric/Behavioral: Negative for confusion.       Objective:   Physical Exam Constitutional:      Appearance: She is well-developed.  Eyes:     Pupils: Pupils are equal, round, and reactive to light.  Neck:     Musculoskeletal: Neck supple.     Thyroid: No thyromegaly.     Vascular: No JVD.  Cardiovascular:     Rate and Rhythm: Normal rate and regular rhythm.     Heart sounds: No gallop.   Pulmonary:     Effort: Pulmonary effort is normal. No respiratory distress.     Breath sounds: Normal breath sounds. No wheezing or rales.  Neurological:     Mental Status: She is alert.     Comments: Patient has tremor of her extremities which is chronic.  Does not extinguish with movement.  No focal weakness.  She has vertigo triggered when moving head to the right side but not the left.  No ataxia.  Gait is stable  Assessment:     #1 vertigo with symptoms exacerbated to the right.  Suspect benign peripheral positional vertigo  #2 history of recurrent depression currently stable on regimen above  #3 prediabetes/hyperglycemia via recent labs  #4 mildly elevated TSH  #5 severe dyslipidemia with history of intolerance to multiple statins    Plan:     -Flu vaccine given -Discussed Epley maneuvers with handout given.  Consider physical therapy referral for vestibular rehab if not relieved with home exercises -Discussed importance of her getting follow-up regarding her elevated TSH and blood sugars and she would like to wait for a couple months before doing so  Alexandra Covey MD [facility]

## 2019-07-10 NOTE — Telephone Encounter (Signed)
Refill OK

## 2019-07-10 NOTE — Telephone Encounter (Signed)
Last rx given on 6/29 for #270 with no refills

## 2019-07-11 NOTE — Telephone Encounter (Signed)
Rx done. 

## 2019-07-21 ENCOUNTER — Other Ambulatory Visit: Payer: Self-pay | Admitting: Family Medicine

## 2019-07-25 NOTE — Telephone Encounter (Signed)
Please refill if appropriate

## 2019-07-26 NOTE — Telephone Encounter (Signed)
Pt calling back to check status. Please advise  °

## 2019-07-27 NOTE — Telephone Encounter (Signed)
Dr. Elease Hashimoto sent this rx in yesterday. Called pt and notified her. She states she will check with pharmacy

## 2019-10-01 ENCOUNTER — Other Ambulatory Visit: Payer: Self-pay | Admitting: Family Medicine

## 2019-10-10 ENCOUNTER — Other Ambulatory Visit: Payer: Self-pay | Admitting: Family Medicine

## 2019-11-18 ENCOUNTER — Other Ambulatory Visit: Payer: Self-pay | Admitting: Family Medicine

## 2019-12-18 ENCOUNTER — Telehealth: Payer: Self-pay | Admitting: Family Medicine

## 2019-12-18 NOTE — Telephone Encounter (Signed)
Recommend Doxy or office follow up to discuss

## 2019-12-18 NOTE — Telephone Encounter (Signed)
Please advise 

## 2019-12-18 NOTE — Telephone Encounter (Signed)
Pt physicist recommended she have a sleep study done, due to her being wobbly in the mornings!

## 2019-12-19 ENCOUNTER — Encounter: Payer: Self-pay | Admitting: Family Medicine

## 2019-12-19 ENCOUNTER — Other Ambulatory Visit: Payer: Self-pay

## 2019-12-19 ENCOUNTER — Ambulatory Visit (INDEPENDENT_AMBULATORY_CARE_PROVIDER_SITE_OTHER): Payer: Medicare Other | Admitting: Family Medicine

## 2019-12-19 VITALS — BP 126/74 | HR 76 | Temp 97.6°F | Wt 183.5 lb

## 2019-12-19 DIAGNOSIS — R739 Hyperglycemia, unspecified: Secondary | ICD-10-CM | POA: Diagnosis not present

## 2019-12-19 DIAGNOSIS — R5383 Other fatigue: Secondary | ICD-10-CM | POA: Diagnosis not present

## 2019-12-19 DIAGNOSIS — R0683 Snoring: Secondary | ICD-10-CM | POA: Diagnosis not present

## 2019-12-19 DIAGNOSIS — R400 Somnolence: Secondary | ICD-10-CM

## 2019-12-19 LAB — POCT GLYCOSYLATED HEMOGLOBIN (HGB A1C): Hemoglobin A1C: 5.8 % — AB (ref 4.0–5.6)

## 2019-12-19 NOTE — Telephone Encounter (Signed)
Pt has been scheduled.  °

## 2019-12-19 NOTE — Patient Instructions (Signed)
Sleep Apnea Sleep apnea is a condition in which breathing pauses or becomes shallow during sleep. Episodes of sleep apnea usually last 10 seconds or longer, and they may occur as many as 20 times an hour. Sleep apnea disrupts your sleep and keeps your body from getting the rest that it needs. This condition can increase your risk of certain health problems, including:  Heart attack.  Stroke.  Obesity.  Diabetes.  Heart failure.  Irregular heartbeat. What are the causes? There are three kinds of sleep apnea:  Obstructive sleep apnea. This kind is caused by a blocked or collapsed airway.  Central sleep apnea. This kind happens when the part of the brain that controls breathing does not send the correct signals to the muscles that control breathing.  Mixed sleep apnea. This is a combination of obstructive and central sleep apnea. The most common cause of this condition is a collapsed or blocked airway. An airway can collapse or become blocked if:  Your throat muscles are abnormally relaxed.  Your tongue and tonsils are larger than normal.  You are overweight.  Your airway is smaller than normal. What increases the risk? You are more likely to develop this condition if you:  Are overweight.  Smoke.  Have a smaller than normal airway.  Are elderly.  Are female.  Drink alcohol.  Take sedatives or tranquilizers.  Have a family history of sleep apnea. What are the signs or symptoms? Symptoms of this condition include:  Trouble staying asleep.  Daytime sleepiness and tiredness.  Irritability.  Loud snoring.  Morning headaches.  Trouble concentrating.  Forgetfulness.  Decreased interest in sex.  Unexplained sleepiness.  Mood swings.  Personality changes.  Feelings of depression.  Waking up often during the night to urinate.  Dry mouth.  Sore throat. How is this diagnosed? This condition may be diagnosed with:  A medical history.  A physical  exam.  A series of tests that are done while you are sleeping (sleep study). These tests are usually done in a sleep lab, but they may also be done at home. How is this treated? Treatment for this condition aims to restore normal breathing and to ease symptoms during sleep. It may involve managing health issues that can affect breathing, such as high blood pressure or obesity. Treatment may include:  Sleeping on your side.  Using a decongestant if you have nasal congestion.  Avoiding the use of depressants, including alcohol, sedatives, and narcotics.  Losing weight if you are overweight.  Making changes to your diet.  Quitting smoking.  Using a device to open your airway while you sleep, such as: ? An oral appliance. This is a custom-made mouthpiece that shifts your lower jaw forward. ? A continuous positive airway pressure (CPAP) device. This device blows air through a mask when you breathe out (exhale). ? A nasal expiratory positive airway pressure (EPAP) device. This device has valves that you put into each nostril. ? A bi-level positive airway pressure (BPAP) device. This device blows air through a mask when you breathe in (inhale) and breathe out (exhale).  Having surgery if other treatments do not work. During surgery, excess tissue is removed to create a wider airway. It is important to get treatment for sleep apnea. Without treatment, this condition can lead to:  High blood pressure.  Coronary artery disease.  In men, an inability to achieve or maintain an erection (impotence).  Reduced thinking abilities. Follow these instructions at home: Lifestyle  Make any lifestyle changes   that your health care provider recommends.  Eat a healthy, well-balanced diet.  Take steps to lose weight if you are overweight.  Avoid using depressants, including alcohol, sedatives, and narcotics.  Do not use any products that contain nicotine or tobacco, such as cigarettes,  e-cigarettes, and chewing tobacco. If you need help quitting, ask your health care provider. General instructions  Take over-the-counter and prescription medicines only as told by your health care provider.  If you were given a device to open your airway while you sleep, use it only as told by your health care provider.  If you are having surgery, make sure to tell your health care provider you have sleep apnea. You may need to bring your device with you.  Keep all follow-up visits as told by your health care provider. This is important. Contact a health care provider if:  The device that you received to open your airway during sleep is uncomfortable or does not seem to be working.  Your symptoms do not improve.  Your symptoms get worse. Get help right away if:  You develop: ? Chest pain. ? Shortness of breath. ? Discomfort in your back, arms, or stomach.  You have: ? Trouble speaking. ? Weakness on one side of your body. ? Drooping in your face. These symptoms may represent a serious problem that is an emergency. Do not wait to see if the symptoms will go away. Get medical help right away. Call your local emergency services (911 in the [place] Do not drive yourself to the hospital. Summary  Sleep apnea is a condition in which breathing pauses or becomes shallow during sleep.  The most common cause is a collapsed or blocked airway.  The goal of treatment is to restore normal breathing and to ease symptoms during sleep. This information is not intended to replace advice given to you by your health care provider. Make sure you discuss any questions you have with your health care provider. Document Revised: 03/07/2019 Document Reviewed: 05/16/2018 Elsevier Patient Education  2020 Elsevier Inc.  

## 2019-12-19 NOTE — Progress Notes (Signed)
Subjective:     Patient ID: Alexandra Norman, female   DOB: July 20, 1956, 64 y.o.   MRN: [id number]  HPI   Alexandra Norman is seen to discuss concerns for possible sleep apnea.  She sees a psychiatrist in [place] and is on multiple medications including Wellbutrin, Seroquel, Lamictal.  She generally sleeps from 12 midnight to around 9 AM but does not feel she is getting good restful sleep.  She has had some increased daytime fatigue and daytime somnolence.  She has a friend who states that she has some "crescendo snoring" followed by periods of silence with occasional coughing afterwards.  She has never had any sleep studies previously.  She had some nonspecific issues recently with her balance and some generalized weakness.  She saw her psychiatrist last summer and had multiple studies done including TSH of 4.87.  At 1 point she was on low-dose thyroid replacement but her TSH has been stable between 4 and 5 over the past few years.  She has history of prediabetes.  A1c 6.1% last summer.  She has been on gabapentin high-dose for neuropathy pain and also takes baclofen.  Her chronic problems include history of Charcot-Marie-Tooth disease, chronic neuropathy, remote history of ASD repair, history of GERD, history of recurrent depression, fibromyalgia  Past Medical History:  Diagnosis Date  . ADD (attention deficit disorder)   . Allergy   . Arrhythmia   . Arthritis   . Charcot-Marie-Tooth disease   . Depression   . Fibromyalgia   . Headache(784.0)   . Heart murmur   . Hyperlipidemia   . Osteoarthritis   . Osteoporosis   . Rotator cuff tear   . Thyroid disease   . Ulcer   . UTI (urinary tract infection)    Past Surgical History:  Procedure Laterality Date  . CARDIAC SURGERY  1982   ASD repair   . CARPAL TUNNEL RELEASE  2007   bilateral  . FOOT SURGERY  2001 and 2005   bilateral  . HERNIA REPAIR    . WRIST SURGERY Left 08/12/2017    reports that she has never smoked. She has never used  smokeless tobacco. She reports that she does not drink alcohol or use drugs. family history includes Alcohol abuse in her father and mother; Arthritis in her father and mother; Breast cancer in her sister; Mental illness in her mother and sister; Thyroid disease in her mother. Allergies  Allergen Reactions  . Penicillins     hives  . Statins      Review of Systems  Constitutional: Positive for fatigue. Negative for chills and fever.  Cardiovascular: Negative for chest pain, palpitations and leg swelling.  Gastrointestinal: Negative for abdominal pain, nausea and vomiting.  Genitourinary: Negative for dysuria.  Neurological: Positive for weakness.       Objective:   Physical Exam Vitals reviewed.  Constitutional:      Appearance: Normal appearance.  Cardiovascular:     Rate and Rhythm: Normal rate and regular rhythm.  Pulmonary:     Effort: Pulmonary effort is normal.     Breath sounds: Normal breath sounds.  Musculoskeletal:     Right lower leg: No edema.     Left lower leg: No edema.  Neurological:     Mental Status: She is alert.        Assessment:     #1 increased daytime fatigue and somnolence.  She has had some increased snoring and possible observed episodes of obstructive sleep apnea.  She is on multiple  medications that can have sedating effect such as gabapentin and baclofen but has been on these for many years without adverse effect. Epworth sleep scale score of 9  #2 history of prediabetes.  Stable with A1c today 5.8%  #3 Charcot-Marie-Tooth disease with chronic neuropathy    Plan:     -Set up referral to pulmonary for rule out sleep apnea -Continue low glycemic diet -Consider repeat TSH within the next 6 months.  She has history of subclinical hypothyroidism  Eulas Post MD [facility]

## 2019-12-19 NOTE — Telephone Encounter (Signed)
Lvm for pt to call  back to set up an appt

## 2020-01-01 ENCOUNTER — Encounter: Payer: Self-pay | Admitting: Family Medicine

## 2020-01-10 ENCOUNTER — Ambulatory Visit: Payer: Federal, State, Local not specified - PPO | Attending: Internal Medicine

## 2020-01-10 DIAGNOSIS — Z23 Encounter for immunization: Secondary | ICD-10-CM

## 2020-01-10 NOTE — Progress Notes (Signed)
   Covid-19 Vaccination Clinic  Name:  Alexandra Norman    MRN: [id number] DOB: April 01, 1956  01/10/2020  Alexandra Norman was observed post Covid-19 immunization for 15 minutes without incident. She was provided with Vaccine Information Sheet and instruction to access the V-Safe system.   Alexandra Norman was instructed to call 911 with any severe reactions post vaccine: Marland Kitchen Difficulty breathing  . Swelling of face and throat  . A fast heartbeat  . A bad rash all over body  . Dizziness and weakness   Immunizations Administered    Name Date Dose VIS Date Route   Pfizer COVID-19 Vaccine 01/10/2020  1:05 PM 0.3 mL 09/14/2019 Intramuscular   Manufacturer: [employer], [employer]   Lot: [id number]   NDC: [address]-3

## 2020-01-30 ENCOUNTER — Other Ambulatory Visit: Payer: Self-pay

## 2020-01-30 ENCOUNTER — Ambulatory Visit (INDEPENDENT_AMBULATORY_CARE_PROVIDER_SITE_OTHER): Payer: Medicare Other | Admitting: Pulmonary Disease

## 2020-01-30 ENCOUNTER — Encounter: Payer: Self-pay | Admitting: Pulmonary Disease

## 2020-01-30 VITALS — BP 138/68 | HR 71 | Temp 97.7°F | Ht 63.0 in | Wt 181.0 lb

## 2020-01-30 DIAGNOSIS — G4733 Obstructive sleep apnea (adult) (pediatric): Secondary | ICD-10-CM

## 2020-01-30 NOTE — Progress Notes (Signed)
Alexandra Norman    [id number]    01-Oct-1956  Primary Care Physician:Burchette, Elberta Fortis, MD  Referring Physician: Kristian Covey, MD [address] [place],  [place] [address]  Chief complaint:   Patient has been seen for snoring, nonrestorative sleep  HPI:  Snoring, nonrestorative sleep for many years Worse in the last year Recently retired, only gained about 5 pounds in weight Symptoms more significant since she retired  Limited activities Limited with Charcot-Marie-Tooth  Usually goes to bed at 12-1AM, takes almost an hour to fall asleep 1-2 awakenings Final wake up time about 10 AM  About 5 pound weight gain History of snoring, no witnessed apneas history of gasping respirations No palpitations  Ability to focus is definitely decreased Memory is okay No family history of sleep disordered breathing   Outpatient Encounter Medications as of 01/30/2020  Medication Sig  . aspirin 325 MG tablet Take 975 mg by mouth every 6 (six) hours as needed.  . baclofen (LIORESAL) 10 MG tablet TAKE 1 TABLET BY MOUTH THREE TIMES A DAY  . buPROPion (WELLBUTRIN XL) 300 MG 24 hr tablet Take 1 tablet (300 mg total) by mouth daily. (Patient taking differently: Take 450 mg by mouth daily. )  . calcium carbonate (OSCAL) 1500 (600 Ca) MG TABS tablet Take by mouth.  . Coenzyme Q10 (CO Q10) 100 MG CAPS Take by mouth.  . Cranberry 400 MG CAPS Take by mouth.  . diclofenac sodium (VOLTAREN) 1 % GEL Apply up to 4 times per day as needed for pain.  Marland Kitchen esomeprazole (NEXIUM) 40 MG capsule Take 1 capsule (40 mg total) by mouth daily before breakfast.  . fluticasone (FLONASE) 50 MCG/ACT nasal spray SPRAY 2 SPRAYS INTO EACH NOSTRIL EVERY DAY  . gabapentin (NEURONTIN) 800 MG tablet TAKE 1 TABLET BY MOUTH FOUR TIMES A DAY  . lamoTRIgine (LAMICTAL) 150 MG tablet Take 150 mg by mouth daily.   Marland Kitchen loratadine (CLARITIN) 10 MG tablet Take 10 mg by mouth daily.  . Multiple Vitamins-Minerals  (MULTIVITAMIN & MINERAL PO) Take 1 tablet by mouth daily.  . Nutritional Supplements (ADULT GROWTH HORMONE SUPPORT PO) Take 1 capsule by mouth daily.  . ondansetron (ZOFRAN) 4 MG tablet   . Probiotic Product (PROBIOTIC COLON SUPPORT) CAPS Take 1 capsule by mouth daily.  . QUEtiapine (SEROQUEL) 50 MG tablet Take 150 mg by mouth at bedtime.   . traMADol (ULTRAM) 50 MG tablet TAKE 2 TABLETS BY MOUTH TWICE A DAY  . triamcinolone cream (KENALOG) 0.1 % APPLY TO AFFECTED AREA EVERY DAY  . vitamin C (ASCORBIC ACID) 500 MG tablet Take 500 mg by mouth daily.  Marland Kitchen VITAMIN D, CHOLECALCIFEROL, PO Take 2,000 Units by mouth daily.    No facility-administered encounter medications on file as of 01/30/2020.    Allergies as of 01/30/2020 - Review Complete 01/30/2020  Allergen Reaction Noted  . Penicillins  01/31/2012  . Statins  01/31/2012    Past Medical History:  Diagnosis Date  . ADD (attention deficit disorder)   . Allergy   . Arrhythmia   . Arthritis   . Charcot-Marie-Tooth disease   . Depression   . Fibromyalgia   . Headache(784.0)   . Heart murmur   . Hyperlipidemia   . Osteoarthritis   . Osteoporosis   . Rotator cuff tear   . Thyroid disease   . Ulcer   . UTI (urinary tract infection)     Past Surgical History:  Procedure  Laterality Date  . CARDIAC SURGERY  1982   ASD repair   . CARPAL TUNNEL RELEASE  2007   bilateral  . FOOT SURGERY  2001 and 2005   bilateral  . HERNIA REPAIR    . WRIST SURGERY Left 08/12/2017    Family History  Problem Relation Age of Onset  . Alcohol abuse Mother   . Arthritis Mother   . Mental illness Mother        Suicide in 69  . Thyroid disease Mother   . Alcohol abuse Father   . Arthritis Father   . Mental illness Sister        Suicide in 69  . Breast cancer Sister     Social History   Socioeconomic History  . Marital status: Divorced    Spouse name: Not on file  . Number of children: 2  . Years of education: Not on file  .  Highest education level: Not on file  Occupational History  . Occupation: Programmer, systems  Tobacco Use  . Smoking status: Never Smoker  . Smokeless tobacco: Never Used  Substance and Sexual Activity  . Alcohol use: No    Alcohol/week: 0.0 standard drinks  . Drug use: No  . Sexual activity: Not on file  Other Topics Concern  . Not on file  Social History Narrative   Works for the post [employer], has been there 25 years   Not married   Has a son (76) daughter (4). Has two grandchildren    Social Determinants of Health   Financial Resource Strain:   . Difficulty of Paying Living Expenses:   Food Insecurity:   . Worried About Charity fundraiser in the Last Year:   . Arboriculturist in the Last Year:   Transportation Needs:   . Film/video editor (Medical):   Marland Kitchen Lack of Transportation (Non-Medical):   Physical Activity:   . Days of Exercise per Week:   . Minutes of Exercise per Session:   Stress:   . Feeling of Stress :   Social Connections:   . Frequency of Communication with Friends and Family:   . Frequency of Social Gatherings with Friends and Family:   . Attends Religious Services:   . Active Member of Clubs or Organizations:   . Attends Archivist Meetings:   Marland Kitchen Marital Status:   Intimate Partner Violence:   . Fear of Current or Ex-Partner:   . Emotionally Abused:   Marland Kitchen Physically Abused:   . Sexually Abused:     Review of Systems  Constitutional: Positive for fatigue. Negative for activity change.  Respiratory: Positive for shortness of breath.   Cardiovascular: Negative.   Musculoskeletal: Negative.   Psychiatric/Behavioral: Positive for sleep disturbance.    Vitals:   01/30/20 0902  BP: 138/68  Pulse: 71  Temp: 97.7 F (36.5 C)  SpO2: 97%     Physical Exam  Constitutional: She is oriented to person, place, and time. She appears well-developed.  Obese  HENT:  Head: Normocephalic and atraumatic.  Mallampati 3, crowded oropharynx  Eyes:  Pupils are equal, round, and reactive to light. Conjunctivae are normal. Right eye exhibits no discharge. Left eye exhibits no discharge.  Neck: No tracheal deviation present. No thyromegaly present.  Cardiovascular: Normal rate and regular rhythm.  Pulmonary/Chest: Effort normal and breath sounds normal. No respiratory distress. She has no wheezes. She has no rales. She exhibits no tenderness.  Musculoskeletal:  General: No edema. Normal range of motion.     Cervical back: Normal range of motion and neck supple.  Neurological: She is alert and oriented to person, place, and time.  Skin: Skin is warm and dry.  Psychiatric: She has a normal mood and affect.    Results of the Epworth flowsheet 01/30/2020  Sitting and reading 1  Watching TV 1  Sitting, inactive in a public place (e.g. a theatre or a meeting) 0  As a passenger in a car for an hour without a break 0  Lying down to rest in the afternoon when circumstances permit 3  Sitting and talking to someone 0  Sitting quietly after a lunch without alcohol 0  In a car, while stopped for a few minutes in traffic 0  Total score 5   Assessment:  Moderate probability of significant obstructive sleep apnea  Obesity  Deconditioning  Pathophysiology of sleep disordered breathing discussed with the patient Treatment options for sleep disordered breathing discussed with the patient  Plan/Recommendations: We will schedule the patient for home sleep study  Graded exercises encouraged  Follow-up in 3 months  Encouraged to call with any significant concerns   Virl Diamond MD [facility] 01/30/2020, 9:28 AM  CC: Kristian Covey, MD

## 2020-01-30 NOTE — Patient Instructions (Signed)
Moderate probability of significant obstructive sleep apnea  We will schedule you for home sleep study Update you with results as soon as reviewed  We will follow-up with you in a few months  Graded exercises as we discussed Treatment for sleep apnea as we discussed  Call with significant concerns Sleep Apnea Sleep apnea is a condition in which breathing pauses or becomes shallow during sleep. Episodes of sleep apnea usually last 10 seconds or longer, and they may occur as many as 20 times an hour. Sleep apnea disrupts your sleep and keeps your body from getting the rest that it needs. This condition can increase your risk of certain health problems, including:  Heart attack.  Stroke.  Obesity.  Diabetes.  Heart failure.  Irregular heartbeat. What are the causes? There are three kinds of sleep apnea:  Obstructive sleep apnea. This kind is caused by a blocked or collapsed airway.  Central sleep apnea. This kind happens when the part of the brain that controls breathing does not send the correct signals to the muscles that control breathing.  Mixed sleep apnea. This is a combination of obstructive and central sleep apnea. The most common cause of this condition is a collapsed or blocked airway. An airway can collapse or become blocked if:  Your throat muscles are abnormally relaxed.  Your tongue and tonsils are larger than normal.  You are overweight.  Your airway is smaller than normal. What increases the risk? You are more likely to develop this condition if you:  Are overweight.  Smoke.  Have a smaller than normal airway.  Are elderly.  Are female.  Drink alcohol.  Take sedatives or tranquilizers.  Have a family history of sleep apnea. What are the signs or symptoms? Symptoms of this condition include:  Trouble staying asleep.  Daytime sleepiness and tiredness.  Irritability.  Loud snoring.  Morning headaches.  Trouble  concentrating.  Forgetfulness.  Decreased interest in sex.  Unexplained sleepiness.  Mood swings.  Personality changes.  Feelings of depression.  Waking up often during the night to urinate.  Dry mouth.  Sore throat. How is this diagnosed? This condition may be diagnosed with:  A medical history.  A physical exam.  A series of tests that are done while you are sleeping (sleep study). These tests are usually done in a sleep lab, but they may also be done at home. How is this treated? Treatment for this condition aims to restore normal breathing and to ease symptoms during sleep. It may involve managing health issues that can affect breathing, such as high blood pressure or obesity. Treatment may include:  Sleeping on your side.  Using a decongestant if you have nasal congestion.  Avoiding the use of depressants, including alcohol, sedatives, and narcotics.  Losing weight if you are overweight.  Making changes to your diet.  Quitting smoking.  Using a device to open your airway while you sleep, such as: ? An oral appliance. This is a custom-made mouthpiece that shifts your lower jaw forward. ? A continuous positive airway pressure (CPAP) device. This device blows air through a mask when you breathe out (exhale). ? A nasal expiratory positive airway pressure (EPAP) device. This device has valves that you put into each nostril. ? A bi-level positive airway pressure (BPAP) device. This device blows air through a mask when you breathe in (inhale) and breathe out (exhale).  Having surgery if other treatments do not work. During surgery, excess tissue is removed to create a wider  airway. It is important to get treatment for sleep apnea. Without treatment, this condition can lead to:  High blood pressure.  Coronary artery disease.  In men, an inability to achieve or maintain an erection (impotence).  Reduced thinking abilities. Follow these instructions at  home: Lifestyle  Make any lifestyle changes that your health care provider recommends.  Eat a healthy, well-balanced diet.  Take steps to lose weight if you are overweight.  Avoid using depressants, including alcohol, sedatives, and narcotics.  Do not use any products that contain nicotine or tobacco, such as cigarettes, e-cigarettes, and chewing tobacco. If you need help quitting, ask your health care provider. General instructions  Take over-the-counter and prescription medicines only as told by your health care provider.  If you were given a device to open your airway while you sleep, use it only as told by your health care provider.  If you are having surgery, make sure to tell your health care provider you have sleep apnea. You may need to bring your device with you.  Keep all follow-up visits as told by your health care provider. This is important. Contact a health care provider if:  The device that you received to open your airway during sleep is uncomfortable or does not seem to be working.  Your symptoms do not improve.  Your symptoms get worse. Get help right away if:  You develop: ? Chest pain. ? Shortness of breath. ? Discomfort in your back, arms, or stomach.  You have: ? Trouble speaking. ? Weakness on one side of your body. ? Drooping in your face. These symptoms may represent a serious problem that is an emergency. Do not wait to see if the symptoms will go away. Get medical help right away. Call your local emergency services (911 in the [place] Do not drive yourself to the hospital. Summary  Sleep apnea is a condition in which breathing pauses or becomes shallow during sleep.  The most common cause is a collapsed or blocked airway.  The goal of treatment is to restore normal breathing and to ease symptoms during sleep. This information is not intended to replace advice given to you by your health care provider. Make sure you discuss any questions you  have with your health care provider. Document Revised: 03/07/2019 Document Reviewed: 05/16/2018 Elsevier Patient Education  [employer].

## 2020-02-04 ENCOUNTER — Ambulatory Visit: Payer: Federal, State, Local not specified - PPO | Attending: Internal Medicine

## 2020-02-04 DIAGNOSIS — Z23 Encounter for immunization: Secondary | ICD-10-CM

## 2020-02-04 NOTE — Progress Notes (Signed)
   [facility]ov[facility]ination [facility]  Name:  Deshawnda Acrey    MRN: [id number] DOB: 08/24/1956  02/04/2020  Ms. Mcbeth was observed post Covid-19 immunization for 15 minutes without incident. She was provided with Vaccine Information Sheet and instruction to access the V-Safe system.   Ms. Paar was instructed to call 911 with any severe reactions post vaccine: Marland Kitchen Difficulty breathing  . Swelling of face and throat  . A fast heartbeat  . A bad rash all over body  . Dizziness and weakness   Immunizations Administered    Name Date Dose VIS Date Route   Pfizer COVID-19 Vaccine 02/04/2020 10:12 AM 0.3 mL 11/28/2018 Intramuscular   Manufacturer: [employer], [employer]   Lot: [id number]   NDC: [address]-4

## 2020-02-15 ENCOUNTER — Other Ambulatory Visit: Payer: Self-pay | Admitting: Family Medicine

## 2020-02-22 ENCOUNTER — Other Ambulatory Visit: Payer: Self-pay | Admitting: Family Medicine

## 2020-02-25 ENCOUNTER — Telehealth: Payer: Self-pay | Admitting: Pulmonary Disease

## 2020-02-25 NOTE — Telephone Encounter (Signed)
I called pt & scheduled hst.  Nothing further needed.  °

## 2020-03-05 ENCOUNTER — Other Ambulatory Visit: Payer: Self-pay | Admitting: Family Medicine

## 2020-03-05 NOTE — Telephone Encounter (Signed)
Please advise 

## 2020-03-07 DIAGNOSIS — G4733 Obstructive sleep apnea (adult) (pediatric): Secondary | ICD-10-CM | POA: Diagnosis not present

## 2020-03-10 ENCOUNTER — Ambulatory Visit: Payer: Medicare Other

## 2020-03-10 ENCOUNTER — Other Ambulatory Visit: Payer: Self-pay

## 2020-03-10 DIAGNOSIS — G4733 Obstructive sleep apnea (adult) (pediatric): Secondary | ICD-10-CM

## 2020-03-18 ENCOUNTER — Telehealth: Payer: Self-pay | Admitting: Pulmonary Disease

## 2020-03-18 DIAGNOSIS — G4733 Obstructive sleep apnea (adult) (pediatric): Secondary | ICD-10-CM | POA: Diagnosis not present

## 2020-03-18 NOTE — Telephone Encounter (Signed)
Call patient  Sleep study result  Date of study: 03/11/2020  Impression: Moderate obstructive sleep apnea Moderate oxygen desaturations  Recommendation: Recommend CPAP therapy for moderate obstructive sleep apnea Auto titrating CPAP with pressure settings of 5-15 will be appropriate

## 2020-03-19 NOTE — Telephone Encounter (Signed)
Patient contacted with results of home sleep study. Alexandra Norman is willing to start CPAP therapy, orders placed to DME. 3 month recall for follow up placed.

## 2020-03-31 ENCOUNTER — Encounter: Payer: Self-pay | Admitting: Family Medicine

## 2020-03-31 DIAGNOSIS — G600 Hereditary motor and sensory neuropathy: Secondary | ICD-10-CM

## 2020-03-31 NOTE — Telephone Encounter (Signed)
I have placed DME order for walker.  Please let her know order placed.

## 2020-04-01 ENCOUNTER — Encounter (INDEPENDENT_AMBULATORY_CARE_PROVIDER_SITE_OTHER): Payer: Self-pay

## 2020-04-20 ENCOUNTER — Other Ambulatory Visit: Payer: Self-pay | Admitting: Family Medicine

## 2020-05-19 ENCOUNTER — Other Ambulatory Visit: Payer: Self-pay | Admitting: Family Medicine

## 2020-05-21 ENCOUNTER — Telehealth: Payer: Self-pay | Admitting: Family Medicine

## 2020-05-21 ENCOUNTER — Ambulatory Visit (INDEPENDENT_AMBULATORY_CARE_PROVIDER_SITE_OTHER): Payer: Medicare Other

## 2020-05-21 ENCOUNTER — Other Ambulatory Visit: Payer: Self-pay

## 2020-05-21 DIAGNOSIS — Z0000 Encounter for general adult medical examination without abnormal findings: Secondary | ICD-10-CM | POA: Diagnosis not present

## 2020-05-21 DIAGNOSIS — G600 Hereditary motor and sensory neuropathy: Secondary | ICD-10-CM

## 2020-05-21 NOTE — Patient Instructions (Addendum)
Alexandra Norman , Thank you for taking time to come for your Medicare Wellness Visit. I appreciate your ongoing commitment to your health goals. Please review the following plan we discussed and let me know if I can assist you in the future.   Screening recommendations/referrals: Colonoscopy: up to date, next due 07/27/2025 Mammogram: We have noted that you refused this if you change your mind we will be happy to assist you with scheduling  Bone Density: up to date, not needed to repeat unless you have an issue  Recommended yearly ophthalmology/optometry visit for glaucoma screening and checkup Recommended yearly dental visit for hygiene and checkup  Vaccinations: Influenza vaccine: Up to date, next due this flu season 2021  Pneumococcal vaccine: Currently due, you may receive first dose at next in person office visit  Tdap vaccine: Up to date, next due 10/2021 Shingles vaccine: Currently due, please contact your pharmacy to discuss cost and to receive     Advanced directives: Please bring a copy of your advanced directives to your next office visit so that we may scan a copy into your chart.   Conditions/risks identified: None  Next appointment: None  Preventive Care 40-64 Years, Female Preventive care refers to lifestyle choices and visits with your health care provider that can promote health and wellness. What does preventive care include?  A yearly physical exam. This is also called an annual well check.  Dental exams once or twice a year.  Routine eye exams. Ask your health care provider how often you should have your eyes checked.  Personal lifestyle choices, including:  Daily care of your teeth and gums.  Regular physical activity.  Eating a healthy diet.  Avoiding tobacco and drug use.  Limiting alcohol use.  Practicing safe sex.  Taking low-dose aspirin daily starting at age 6.  Taking vitamin and mineral supplements as recommended by your health care  provider. What happens during an annual well check? The services and screenings done by your health care provider during your annual well check will depend on your age, overall health, lifestyle risk factors, and family history of disease. Counseling  Your health care provider may ask you questions about your:  Alcohol use.  Tobacco use.  Drug use.  Emotional well-being.  Home and relationship well-being.  Sexual activity.  Eating habits.  Work and work Statistician.  Method of birth control.  Menstrual cycle.  Pregnancy history. Screening  You may have the following tests or measurements:  Height, weight, and BMI.  Blood pressure.  Lipid and cholesterol levels. These may be checked every 5 years, or more frequently if you are over 91 years old.  Skin check.  Lung cancer screening. You may have this screening every year starting at age 73 if you have a 30-pack-year history of smoking and currently smoke or have quit within the past 15 years.  Fecal occult blood test (FOBT) of the stool. You may have this test every year starting at age 51.  Flexible sigmoidoscopy or colonoscopy. You may have a sigmoidoscopy every 5 years or a colonoscopy every 10 years starting at age 38.  Hepatitis C blood test.  Hepatitis B blood test.  Sexually transmitted disease (STD) testing.  Diabetes screening. This is done by checking your blood sugar (glucose) after you have not eaten for a while (fasting). You may have this done every 1-3 years.  Mammogram. This may be done every 1-2 years. Talk to your health care provider about when you should start having  regular mammograms. This may depend on whether you have a family history of breast cancer.  BRCA-related cancer screening. This may be done if you have a family history of breast, ovarian, tubal, or peritoneal cancers.  Pelvic exam and Pap test. This may be done every 3 years starting at age 48. Starting at age 86, this may be  done every 5 years if you have a Pap test in combination with an HPV test.  Bone density scan. This is done to screen for osteoporosis. You may have this scan if you are at high risk for osteoporosis. Discuss your test results, treatment options, and if necessary, the need for more tests with your health care provider. Vaccines  Your health care provider may recommend certain vaccines, such as:  Influenza vaccine. This is recommended every year.  Tetanus, diphtheria, and acellular pertussis (Tdap, Td) vaccine. You may need a Td booster every 10 years.  Zoster vaccine. You may need this after age 41.  Pneumococcal 13-valent conjugate (PCV13) vaccine. You may need this if you have certain conditions and were not previously vaccinated.  Pneumococcal polysaccharide (PPSV23) vaccine. You may need one or two doses if you smoke cigarettes or if you have certain conditions. Talk to your health care provider about which screenings and vaccines you need and how often you need them. This information is not intended to replace advice given to you by your health care provider. Make sure you discuss any questions you have with your health care provider. Document Released: 10/17/2015 Document Revised: 06/09/2016 Document Reviewed: 07/22/2015 Elsevier Interactive Patient Education  2017 [employer] Prevention in the Home Falls can cause injuries. They can happen to people of all ages. There are many things you can do to make your home safe and to help prevent falls. What can I do on the outside of my home?  Regularly fix the edges of walkways and driveways and fix any cracks.  Remove anything that might make you trip as you walk through a door, such as a raised step or threshold.  Trim any bushes or trees on the path to your home.  Use bright outdoor lighting.  Clear any walking paths of anything that might make someone trip, such as rocks or tools.  Regularly check to see if  handrails are loose or broken. Make sure that both sides of any steps have handrails.  Any raised decks and porches should have guardrails on the edges.  Have any leaves, snow, or ice cleared regularly.  Use sand or salt on walking paths during winter.  Clean up any spills in your garage right away. This includes oil or grease spills. What can I do in the bathroom?  Use night lights.  Install grab bars by the toilet and in the tub and shower. Do not use towel bars as grab bars.  Use non-skid mats or decals in the tub or shower.  If you need to sit down in the shower, use a plastic, non-slip stool.  Keep the floor dry. Clean up any water that spills on the floor as soon as it happens.  Remove soap buildup in the tub or shower regularly.  Attach bath mats securely with double-sided non-slip rug tape.  Do not have throw rugs and other things on the floor that can make you trip. What can I do in the bedroom?  Use night lights.  Make sure that you have a light by your bed that is easy to reach.  Do not use any sheets or blankets that are too big for your bed. They should not hang down onto the floor.  Have a firm chair that has side arms. You can use this for support while you get dressed.  Do not have throw rugs and other things on the floor that can make you trip. What can I do in the kitchen?  Clean up any spills right away.  Avoid walking on wet floors.  Keep items that you use a lot in easy-to-reach places.  If you need to reach something above you, use a strong step stool that has a grab bar.  Keep electrical cords out of the way.  Do not use floor polish or wax that makes floors slippery. If you must use wax, use non-skid floor wax.  Do not have throw rugs and other things on the floor that can make you trip. What can I do with my stairs?  Do not leave any items on the stairs.  Make sure that there are handrails on both sides of the stairs and use them. Fix  handrails that are broken or loose. Make sure that handrails are as long as the stairways.  Check any carpeting to make sure that it is firmly attached to the stairs. Fix any carpet that is loose or worn.  Avoid having throw rugs at the top or bottom of the stairs. If you do have throw rugs, attach them to the floor with carpet tape.  Make sure that you have a light switch at the top of the stairs and the bottom of the stairs. If you do not have them, ask someone to add them for you. What else can I do to help prevent falls?  Wear shoes that:  Do not have high heels.  Have rubber bottoms.  Are comfortable and fit you well.  Are closed at the toe. Do not wear sandals.  If you use a stepladder:  Make sure that it is fully opened. Do not climb a closed stepladder.  Make sure that both sides of the stepladder are locked into place.  Ask someone to hold it for you, if possible.  Clearly mark and make sure that you can see:  Any grab bars or handrails.  First and last steps.  Where the edge of each step is.  Use tools that help you move around (mobility aids) if they are needed. These include:  Canes.  Walkers.  Scooters.  Crutches.  Turn on the lights when you go into a dark area. Replace any light bulbs as soon as they burn out.  Set up your furniture so you have a clear path. Avoid moving your furniture around.  If any of your floors are uneven, fix them.  If there are any pets around you, be aware of where they are.  Review your medicines with your doctor. Some medicines can make you feel dizzy. This can increase your chance of falling. Ask your doctor what other things that you can do to help prevent falls. This information is not intended to replace advice given to you by your health care provider. Make sure you discuss any questions you have with your health care provider. Document Released: 07/17/2009 Document Revised: 02/26/2016 Document Reviewed:  10/25/2014 Elsevier Interactive Patient Education  2017 [employer].

## 2020-05-21 NOTE — Telephone Encounter (Signed)
During AWV visit patient mentioned worsening muscular atrophy and wanted to know if you would be willing to send her to OT and or PT. She is now having issues with holding on to ink pens to write and etc. Please advise?
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
## 2020-05-22 NOTE — Telephone Encounter (Signed)
Agree.  Let her know I have place order for PT and OT- if indicated.   Thanks!

## 2020-05-23 NOTE — Telephone Encounter (Signed)
Left  message informing patient that orders have been placed.

## 2020-06-16 ENCOUNTER — Other Ambulatory Visit: Payer: Self-pay | Admitting: Family Medicine

## 2020-06-18 ENCOUNTER — Ambulatory Visit: Payer: Medicare Other | Attending: Family Medicine

## 2020-06-18 ENCOUNTER — Other Ambulatory Visit: Payer: Self-pay

## 2020-06-18 DIAGNOSIS — R2689 Other abnormalities of gait and mobility: Secondary | ICD-10-CM | POA: Diagnosis present

## 2020-06-18 DIAGNOSIS — R2681 Unsteadiness on feet: Secondary | ICD-10-CM | POA: Diagnosis present

## 2020-06-18 DIAGNOSIS — M6281 Muscle weakness (generalized): Secondary | ICD-10-CM | POA: Diagnosis present

## 2020-06-18 NOTE — Therapy (Signed)
[facility] [address] [place], [place], [address] Phone: [phone]   Fax:  [phone]  Physical Therapy Evaluation  Patient Details  Name: Alexandra Norman MRN: [id number] Date of Birth: Feb 22, 1956 Referring Provider (PT): Evelena Peat   Encounter Date: 06/18/2020   PT End of Session - 06/18/20 1144    Visit Number 1    Number of Visits 17    Date for PT Re-Evaluation 09/16/20   90 day cert, 60 day poc   Authorization Type Medicare so 10th visit progress note.    PT Start Time 1104    PT Stop Time 1145    PT Time Calculation (min) 41 min    Equipment Utilized During Treatment Gait belt    Activity Tolerance Patient tolerated treatment well    Behavior During Therapy WFL for tasks assessed/performed           Past Medical History:  Diagnosis Date  . ADD (attention deficit disorder)   . Allergy   . Arrhythmia   . Arthritis   . Charcot-Marie-Tooth disease   . Depression   . Fibromyalgia   . Headache(784.0)   . Heart murmur   . Hyperlipidemia   . Osteoarthritis   . Osteoporosis   . Rotator cuff tear   . Thyroid disease   . Ulcer   . UTI (urinary tract infection)     Past Surgical History:  Procedure Laterality Date  . CARDIAC SURGERY  1982   ASD repair   . CARPAL TUNNEL RELEASE  2007   bilateral  . CATARACT EXTRACTION, BILATERAL Bilateral   . FOOT SURGERY  2001 and 2005   bilateral  . HERNIA REPAIR    . WRIST SURGERY Left 08/12/2017    There were no vitals filed for this visit.    Subjective Assessment - 06/18/20 1107    Subjective Pt reports that she was diagnosed with charcot marie tooth in 1990. In 2017 she had a fall and broke her left wrist and had surgery but has never been the same. Has plates and screws in left wrist and reports will need to have them replaced at some point. Pt also reports fall in June of this year and breaking a bone in left foot. She reports that her balance  is not good. Her right side has always been her more involved side but is starting to move more to the left. Pt reports that she has not worked since 2017 so feels she has not been as active. Pt wants to be able to increase her muscle tone. Pt currently walks with SPC with quad tip. Pt reports that she has a brace for right ankle but has not been wearing much. Pt has had extensive surgery on right foot to help correct back in 2000. Great toe fused and tendon transfer and toe joints removed per pt. Also had surgery on left but not as extensive. Pt's right shoe has lift built up on heel laterally to help revent rolling out some that she got off [employer]. Pt also wearing left wrist brace.    Patient Stated Goals Pt wants to increase her muscle tone to try to improve strength and balance.    Currently in Pain? Yes    Pain Score 0-No pain   no pain at rest, Increases with movement very high   Pain Location Hand    Pain Orientation Right;Left    Pain Descriptors / Indicators Aching    Pain Type Chronic pain  Pain Onset More than a month ago    Pain Frequency Intermittent              [facility] PT Assessment - 06/18/20 1123      Assessment   Medical Diagnosis charcot marie tooth muscular atrophy.    Referring Provider (PT) Evelena PeatBruce Burchette    Onset Date/Surgical Date 05/22/20    Hand Dominance Right      Precautions   Precautions Fall    Required Braces or Orthoses Other Brace/Splint    Other Brace/Splint reports that she has ankle brace but did not have at session. She reports she can not tolerate AFO due to right great toe being fused and too painful with the pressure when has tried in the past.      Balance Screen   Has the patient fallen in the past 6 months Yes    How many times? 1    Has the patient had a decrease in activity level because of a fear of falling?  Yes    Is the patient reluctant to leave their home because of a fear of falling?  Yes      Home Environment   Living  Environment Private residence    Living Arrangements Alone    Type of Home Apartment    Home Access Stairs to enter    Entrance Stairs-Number of Steps 12    Entrance Stairs-Rails Left;Right   rail on left and cane on right   Home Layout One level    Home Equipment [place] - single point;Walker - 2 wheels;Grab bars - tub/shower   quad tip on cane   Additional Comments neighbor gave her a walker to use. Pt has everything delivered to her home. Hardly gets out. Does still drive.      Prior Function   Level of Independence Independent with household mobility with device    Vocation Retired;On disability    Vocation Requirements retired Paramedicpostal worker    Leisure play on [employer], Scientist, physiologicalwatch tv and read      Cognition   Overall Cognitive Status Within Functional Limits for tasks assessed      Sensation   Light Touch Impaired by gross assessment    Additional Comments pt had decreased sensation right great toe and left lateral toes.      ROM / Strength   AROM / PROM / Strength Strength;AROM      AROM   Overall AROM Comments Pt has decreased left wrist supination      Strength   Strength Assessment Site Shoulder;Elbow;Hand;Hip;Knee;Ankle    Right/Left Shoulder Right;Left    Right Shoulder Flexion 5/5    Left Shoulder Flexion 5/5    Right/Left Elbow Right;Left    Right Elbow Flexion 5/5    Right Elbow Extension 5/5    Left Elbow Flexion 5/5    Left Elbow Extension 5/5    Right/Left hand Right;Left    Right Hand Gross Grasp Functional    Left Hand Gross Grasp Functional    Right/Left Hip Right;Left    Right Hip Flexion 4/5    Right Hip ABduction 4/5    Left Hip Flexion 3+/5    Left Hip ABduction 4/5    Right/Left Knee Right;Left    Right Knee Flexion 4/5    Right Knee Extension 4+/5    Left Knee Flexion 4/5    Left Knee Extension 4+/5    Right/Left Ankle Right;Left    Right Ankle Dorsiflexion 3+/5   within available  range   Right Ankle Plantar Flexion --   inverts with PF   Right  Ankle Inversion 3/5    Right Ankle Eversion 2-/5    Left Ankle Dorsiflexion 4/5    Left Ankle Plantar Flexion 4/5   assessed seated   Left Ankle Inversion 4/5    Left Ankle Eversion 4/5      Transfers   Transfers Sit to Stand;Stand to Sit    Sit to Stand 5: Supervision    Sit to Stand Details Verbal cues for technique    Sit to Stand Details (indicate cue type and reason) Pt rises with right foot extremely far behind chair.    Five time sit to stand comments  12.64 sec from chair with hands. Attempted without hands but only able to rise once with hands on knees and feet way back behind knees under chair.    Stand to Sit 5: Supervision      Ambulation/Gait   Ambulation/Gait Yes    Ambulation/Gait Assistance 5: Supervision;4: Min guard    Ambulation/Gait Assistance Details decreased right heel strike and walks on outside of foot.    Ambulation Distance (Feet) 100 Feet    Assistive device Straight cane   with quad tip   Gait Pattern Step-through pattern;Decreased stance time - right;Decreased step length - right;Decreased step length - left;Decreased arm swing - left    Ambulation Surface Level;Indoor    Gait velocity 29 sec=0.84m/s    Gait Comments Pt uses cane on right side.      Standardized Balance Assessment   Standardized Balance Assessment Timed Up and Go Test      Timed Up and Go Test   TUG Normal TUG    Normal TUG (seconds) 24                      Objective measurements completed on examination: See above findings.               PT Education - 06/18/20 1226    Education Details Discussed PT plan of care and will request OT referral.    Person(s) Educated Patient    Methods Explanation    Comprehension Verbalized understanding            PT Short Term Goals - 06/18/20 1339      PT SHORT TERM GOAL #1   Title Pt will be independent with initial HEP for strengthening/ROM and balance.    Time 4    Period Weeks    Status New    Target Date  07/18/20      PT SHORT TERM GOAL #2   Title Berg Balance will be assessed and LTG written to further assess balance.    Time 4    Period Weeks    Status New    Target Date 07/18/20      PT SHORT TERM GOAL #3   Title Pt will decrease TUG from 24 sec to <20 sec for improved balance and functional mobility.    Baseline 06/18/20 24 sec    Time 4    Period Weeks    Status New    Target Date 07/18/20      PT SHORT TERM GOAL #4   Title Pt will ambulate >300' mod I with RW versus cane on level surfaces for improved household mobility and safety.    Time 4    Period Weeks    Status New    Target Date 07/18/20  PT Long Term Goals - 06/18/20 1342      PT LONG TERM GOAL #1   Title Pt will be independent with progressive HEP for strengthening/ROM and balance to continue gains on own.    Time 8    Period Weeks    Status New    Target Date 08/17/20      PT LONG TERM GOAL #2   Title Pt will be able to perform sit to stand x 5 without hands for improved functional strength safely.    Baseline 1 rep without hands CGA.    Time 8    Period Weeks    Status New    Target Date 08/17/20      PT LONG TERM GOAL #3   Title Pt will increase gait speed from 0.75m/s to >0.79m/s for improved safety with household mobility.    Baseline 0.73m/s on 06/18/20    Time 8    Period Weeks    Status New    Target Date 08/17/20      PT LONG TERM GOAL #4   Title Pt will ambulate >500' on outdoor paved surfaces with LRAD mod I for improved short community distances.    Time 8    Period Weeks    Status New    Target Date 08/17/20      PT LONG TERM GOAL #5   Title Pt will ambulate up/down flight of stairs with cane and 1 railing mod I for improved access to home.    Time 8    Period Weeks    Status New    Target Date 08/17/20      Additional Long Term Goals   Additional Long Term Goals Yes      PT LONG TERM GOAL #6   Title Berg TBD    Time 8    Period Weeks    Status New     Target Date 08/17/20                  Plan - 06/18/20 1227    Clinical Impression Statement Pt presents to clinic with referral for history of charcot marie tooth muscular atrophy. Pt reports weakness in legs and also hands. Weakness in legs most prevalant around ankle with right more so than left. Pt is ambulating with cane at slow gait speed of 0.22m/s indicating decreased safety with household ambulation. She is fall risk based on TUG of 24 sec and 5 x sit to stand of 12.64 sec requiring UE support to rise. Pt will benefit from skilled PT to address her strength, balance and functional mobility deficits. OT will address hands/UE deficits.    Personal Factors and Comorbidities Comorbidity 1    Comorbidities depression    Examination-Activity Limitations Stand;Locomotion Level;Stairs;Transfers    Examination-Participation Restrictions Community Activity;Cleaning    Stability/Clinical Decision Making Stable/Uncomplicated    Clinical Decision Making Moderate    Rehab Potential Good    PT Frequency 2x / week   plus eval   PT Duration 8 weeks    PT Treatment/Interventions ADLs/Self Care Home Management;Cryotherapy;Moist Heat;DME Instruction;Gait training;Stair training;Functional mobility training;Therapeutic activities;Therapeutic exercise;Orthotic Fit/Training;Patient/family education;Neuromuscular re-education;Balance training;Passive range of motion;Vestibular;Manual techniques    PT Next Visit Plan Make sure OT gets scheduled. Assess Berg Balance and update LTG. Gait training with cane. Can we try it on left side and see if changes anything? She has been using on right for awhile. Assess gait with RW. Begin initial strengthening/ROM HEP for hip flexion and ankles.  Balance training.    Consulted and Agree with Plan of Care Patient           Patient will benefit from skilled therapeutic intervention in order to improve the following deficits and impairments:  Abnormal gait, Decreased  activity tolerance, Decreased balance, Decreased endurance, Decreased mobility, Decreased knowledge of use of DME, Decreased range of motion, Decreased strength, Impaired flexibility, Impaired sensation  Visit Diagnosis: Other abnormalities of gait and mobility  Muscle weakness (generalized)  Unsteadiness on feet     Problem List Patient Active Problem List   Diagnosis Date Noted  . Hyperglycemia 07/09/2019  . Constipation 04/29/2015  . Encounter to establish care 04/29/2015  . Peripheral neuropathy 09/07/2012  . Charcot-Marie-Tooth disease 01/31/2012  . Hypothyroid 01/31/2012  . Atrial septal defect 01/31/2012  . Hyperlipidemia 01/31/2012  . GERD (gastroesophageal reflux disease) 01/31/2012  . Osteoarthritis 01/31/2012  . Osteoporosis 01/31/2012  . Depression 01/31/2012  . ADD (attention deficit disorder) 01/31/2012  . Fibromyalgia 01/31/2012  . Allergic rhinitis 01/31/2012    Ronn Melena, PT, DPT, NCS 06/18/2020, 1:46 PM  [facility] [address] [place], [place], [address] Phone: [phone]   Fax:  [phone]  Name: Charisma Charlot MRN: [id number] Date of Birth: 10/14/1955

## 2020-07-02 ENCOUNTER — Telehealth: Payer: Self-pay

## 2020-07-02 ENCOUNTER — Other Ambulatory Visit: Payer: Self-pay | Admitting: Family Medicine

## 2020-07-02 ENCOUNTER — Other Ambulatory Visit: Payer: Self-pay

## 2020-07-02 ENCOUNTER — Ambulatory Visit: Payer: Medicare Other

## 2020-07-02 DIAGNOSIS — R2681 Unsteadiness on feet: Secondary | ICD-10-CM

## 2020-07-02 DIAGNOSIS — G600 Hereditary motor and sensory neuropathy: Secondary | ICD-10-CM

## 2020-07-02 DIAGNOSIS — R2689 Other abnormalities of gait and mobility: Secondary | ICD-10-CM

## 2020-07-02 DIAGNOSIS — M6281 Muscle weakness (generalized): Secondary | ICD-10-CM

## 2020-07-02 NOTE — Telephone Encounter (Signed)
Refill for 6 months. 

## 2020-07-02 NOTE — Telephone Encounter (Signed)
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 She will benefit from use of RW in order to improve safety with functional mobility.   Please submit order in epic or fax to [phone].   Thank you, Elmer Bales, PT, DPT, NCS

## 2020-07-02 NOTE — Therapy (Signed)
[facility] [address] [place], [place], [address] Phone: [phone]   Fax:  [phone]  Physical Therapy Treatment  Patient Details  Name: Alexandra Norman MRN: [id number] Date of Birth: June 15, 1956 Referring Provider (PT): Evelena Peat   Encounter Date: 07/02/2020   PT End of Session - 07/02/20 1705    Visit Number 2    Number of Visits 17    Date for PT Re-Evaluation 09/16/20   90 day cert, 60 day poc   Authorization Type Medicare so 10th visit progress note.    PT Start Time 1700    PT Stop Time 1745    PT Time Calculation (min) 45 min    Equipment Utilized During Treatment Gait belt    Activity Tolerance Patient tolerated treatment well    Behavior During Therapy WFL for tasks assessed/performed           Past Medical History:  Diagnosis Date  . ADD (attention deficit disorder)   . Allergy   . Arrhythmia   . Arthritis   . Charcot-Marie-Tooth disease   . Depression   . Fibromyalgia   . Headache(784.0)   . Heart murmur   . Hyperlipidemia   . Osteoarthritis   . Osteoporosis   . Rotator cuff tear   . Thyroid disease   . Ulcer   . UTI (urinary tract infection)     Past Surgical History:  Procedure Laterality Date  . CARDIAC SURGERY  1982   ASD repair   . CARPAL TUNNEL RELEASE  2007   bilateral  . CATARACT EXTRACTION, BILATERAL Bilateral   . FOOT SURGERY  2001 and 2005   bilateral  . HERNIA REPAIR    . WRIST SURGERY Left 08/12/2017    There were no vitals filed for this visit.   Subjective Assessment - 07/02/20 1705    Subjective Pt reports that she had a fall on to hands yesterday when was bending forward and bumped shin in bird feeder. She reports that she feels very unsteady looking down and when washes hair and closes eyes feels like she is drunk.    Patient Stated Goals Pt wants to increase her muscle tone to try to improve strength and balance.    Currently in Pain? No/denies      Pain Onset More than a month ago                             [facility] Adult PT Treatment/Exercise - 07/02/20 1709      Ambulation/Gait   Ambulation/Gait Yes    Ambulation/Gait Assistance 5: Supervision;4: Min guard    Ambulation/Gait Assistance Details  Pt performed first bout with cane on right as she usually uses with step-to/through pattern with decreased cadence and step length. Trialed cane on left and was similar pattern but more upright. Pt has unsteadiness with both. 3rd bout used RW. Pt had much improved reciprocal pattern with RW.    Ambulation Distance (Feet) 115 Feet   76' with cane on right, 71' with RW   Assistive device Straight cane   with quad tip and RW. right ASO   Gait Pattern Step-to pattern;Step-through pattern;Decreased step length - right;Decreased step length - left    Ambulation Surface Level;Indoor      Standardized Balance Assessment   Standardized Balance Assessment Berg Balance Test      Berg Balance Test   Sit to Stand Able to stand  independently  using hands    Standing Unsupported Able to stand 2 minutes with supervision    Sitting with Back Unsupported but Feet Supported on Floor or Stool Able to sit safely and securely 2 minutes    Stand to Sit Controls descent by using hands    Transfers Able to transfer safely, definite need of hands    Standing Unsupported with Eyes Closed Needs help to keep from falling    Standing Ubsupported with Feet Together Able to place feet together independently but unable to hold for 30 seconds    From Standing, Reach Forward with Outstretched Arm Can reach forward >12 cm safely (5")    From Standing Position, Pick up Object from Floor Able to pick up shoe, needs supervision    From Standing Position, Turn to Look Behind Over each Shoulder Turn sideways only but maintains balance    Turn 360 Degrees Needs close supervision or verbal cueing    Standing Unsupported, Alternately Place Feet on Step/Stool  Able to complete 4 steps without aid or supervision    Standing Unsupported, One Foot in Front Needs help to step but can hold 15 seconds    Standing on One Leg Tries to lift leg/unable to hold 3 seconds but remains standing independently    Total Score 31      High Level Balance   High Level Balance Comments MCTSIB 1/4. unable to hold with eyes closed or on compliant for 30 sec. Pt loses alance immediately on compliant surface with eyes closed.                  PT Education - 07/02/20 2046    Education Details Discussed results of Berg testing. Will continue to trial cane on right to see if can be safe in therapy for now. Recommending RW and PT will request order from MD.    Person(s) Educated Patient    Methods Explanation    Comprehension Verbalized understanding            PT Short Term Goals - 07/02/20 2049      PT SHORT TERM GOAL #1   Title Pt will be independent with initial HEP for strengthening/ROM and balance.    Time 4    Period Weeks    Status New    Target Date 07/18/20      PT SHORT TERM GOAL #2   Title Berg Balance will be assessed and LTG written to further assess balance.    Baseline performed on 07/02/20 with 31/56    Time 4    Period Weeks    Status Achieved    Target Date 07/18/20      PT SHORT TERM GOAL #3   Title Pt will decrease TUG from 24 sec to <20 sec for improved balance and functional mobility.    Baseline 06/18/20 24 sec    Time 4    Period Weeks    Status New    Target Date 07/18/20      PT SHORT TERM GOAL #4   Title Pt will ambulate >300' mod I with RW versus cane on level surfaces for improved household mobility and safety.    Time 4    Period Weeks    Status New    Target Date 07/18/20             PT Long Term Goals - 07/02/20 2050      PT LONG TERM GOAL #1   Title Pt will be independent  with progressive HEP for strengthening/ROM and balance to continue gains on own.    Time 8    Period Weeks    Status New       PT LONG TERM GOAL #2   Title Pt will be able to perform sit to stand x 5 without hands for improved functional strength safely.    Baseline 1 rep without hands CGA.    Time 8    Period Weeks    Status New      PT LONG TERM GOAL #3   Title Pt will increase gait speed from 0.36m/s to >0.73m/s for improved safety with household mobility.    Baseline 0.32m/s on 06/18/20    Time 8    Period Weeks    Status New      PT LONG TERM GOAL #4   Title Pt will ambulate >500' on outdoor paved surfaces with LRAD mod I for improved short community distances.    Time 8    Period Weeks    Status New      PT LONG TERM GOAL #5   Title Pt will ambulate up/down flight of stairs with cane and 1 railing mod I for improved access to home.    Time 8    Period Weeks    Status New      PT LONG TERM GOAL #6   Title Pt will increase Berg Balance from 31/56 to >36/56 for improved blance and decreased fall risk.    Baseline 07/02/20 31/56    Time 8    Period Weeks    Status New                 Plan - 07/02/20 2051    Clinical Impression Statement Pt is high fall risk based on Berg score of 31/56. Pt relies heavily on vision component of balance with poor somatosensory and vestibular system input. She loses balance immediatly on compliant surface with eyes closed. Will need to work to see if can improve vestibular input at all. PT trialed cane on left which pt was nervous about but did better than she thought. Will need to practice more to see if this is safe option as has used cane on right for some time. Pt was safest with RW and PT will request order for one.    Personal Factors and Comorbidities Comorbidity 1    Comorbidities depression    Examination-Activity Limitations Stand;Locomotion Level;Stairs;Transfers    Examination-Participation Restrictions Community Activity;Cleaning    Stability/Clinical Decision Making Stable/Uncomplicated    Rehab Potential Good    PT Frequency 2x / week   plus eval    PT Duration 8 weeks    PT Treatment/Interventions ADLs/Self Care Home Management;Cryotherapy;Moist Heat;DME Instruction;Gait training;Stair training;Functional mobility training;Therapeutic activities;Therapeutic exercise;Orthotic Fit/Training;Patient/family education;Neuromuscular re-education;Balance training;Passive range of motion;Vestibular;Manual techniques    PT Next Visit Plan Gait training with cane trying to see if safer on left side. Begin initial strengthening/ROM HEP for hip flexion and ankles. Balance training on solid surface to begin.    Consulted and Agree with Plan of Care Patient           Patient will benefit from skilled therapeutic intervention in order to improve the following deficits and impairments:  Abnormal gait, Decreased activity tolerance, Decreased balance, Decreased endurance, Decreased mobility, Decreased knowledge of use of DME, Decreased range of motion, Decreased strength, Impaired flexibility, Impaired sensation  Visit Diagnosis: Other abnormalities of gait and mobility  Muscle weakness (generalized)  Unsteadiness on  feet     Problem List Patient Active Problem List   Diagnosis Date Noted  . Hyperglycemia 07/09/2019  . Constipation 04/29/2015  . Encounter to establish care 04/29/2015  . Peripheral neuropathy 09/07/2012  . Charcot-Marie-Tooth disease 01/31/2012  . Hypothyroid 01/31/2012  . Atrial septal defect 01/31/2012  . Hyperlipidemia 01/31/2012  . GERD (gastroesophageal reflux disease) 01/31/2012  . Osteoarthritis 01/31/2012  . Osteoporosis 01/31/2012  . Depression 01/31/2012  . ADD (attention deficit disorder) 01/31/2012  . Fibromyalgia 01/31/2012  . Allergic rhinitis 01/31/2012    Ronn MelenaEmily A Sang Blount, PT, DPT, NCS 07/02/2020, 8:58 PM  [facility] [address] [place], [place], [address] Phone: [phone]   Fax:  [phone]  Name: Alexandra Norman MRN:  [id number] Date of Birth: 16-Feb-1956

## 2020-07-03 NOTE — Telephone Encounter (Signed)
Just want to clarify.   Is "RW" rolling walker?  Smitty Cords

## 2020-07-03 NOTE — Telephone Encounter (Signed)
Yes a front wheeled walker. Irving Burton

## 2020-07-04 ENCOUNTER — Ambulatory Visit: Payer: Medicare Other | Attending: Family Medicine | Admitting: Physical Therapy

## 2020-07-04 ENCOUNTER — Encounter: Payer: Self-pay | Admitting: Physical Therapy

## 2020-07-04 ENCOUNTER — Other Ambulatory Visit: Payer: Self-pay

## 2020-07-04 DIAGNOSIS — R2689 Other abnormalities of gait and mobility: Secondary | ICD-10-CM | POA: Diagnosis present

## 2020-07-04 DIAGNOSIS — R2681 Unsteadiness on feet: Secondary | ICD-10-CM | POA: Diagnosis present

## 2020-07-04 DIAGNOSIS — M6281 Muscle weakness (generalized): Secondary | ICD-10-CM | POA: Insufficient documentation

## 2020-07-04 NOTE — Therapy (Signed)
[facility] [address] [place], [place], [address] Phone: [phone]   Fax:  [phone]  Physical Therapy Treatment  Patient Details  Name: Alexandra Norman MRN: [id number] Date of Birth: 02/23/1956 Referring Provider (PT): Evelena Peat   Encounter Date: 07/04/2020   PT End of Session - 07/04/20 1454    Visit Number 3    Number of Visits 17    Date for PT Re-Evaluation 09/16/20   90 day cert, 60 day poc   Authorization Type Medicare so 10th visit progress note.    PT Start Time 1449    PT Stop Time 1532    PT Time Calculation (min) 43 min    Equipment Utilized During Treatment Gait belt    Activity Tolerance Patient tolerated treatment well    Behavior During Therapy WFL for tasks assessed/performed           Past Medical History:  Diagnosis Date  . ADD (attention deficit disorder)   . Allergy   . Arrhythmia   . Arthritis   . Charcot-Marie-Tooth disease   . Depression   . Fibromyalgia   . Headache(784.0)   . Heart murmur   . Hyperlipidemia   . Osteoarthritis   . Osteoporosis   . Rotator cuff tear   . Thyroid disease   . Ulcer   . UTI (urinary tract infection)     Past Surgical History:  Procedure Laterality Date  . CARDIAC SURGERY  1982   ASD repair   . CARPAL TUNNEL RELEASE  2007   bilateral  . CATARACT EXTRACTION, BILATERAL Bilateral   . FOOT SURGERY  2001 and 2005   bilateral  . HERNIA REPAIR    . WRIST SURGERY Left 08/12/2017    There were no vitals filed for this visit.   Subjective Assessment - 07/04/20 1453    Subjective No new complaints. No falls or pain to report.    Patient Stated Goals Pt wants to increase her muscle tone to try to improve strength and balance.    Currently in Pain? No/denies    Pain Score 0-No pain                 [facility] Adult PT Treatment/Exercise - 07/04/20 1457      Transfers   Transfers Sit to Stand;Stand to Sit    Sit to Stand 5:  Supervision;With upper extremity assist;From bed;From chair/3-in-1    Stand to Sit 5: Supervision;With upper extremity assist;To bed;To chair/3-in-1    Comments multiple reps performed with emphasis on correct technique. mod cues needed for this.       Ambulation/Gait   Ambulation/Gait Yes    Ambulation/Gait Assistance 4: Min guard;4: Min assist;3: Mod assist    Ambulation/Gait Assistance Details attempted gait with cane on left side     Ambulation Distance (Feet) 80 Feet   x1 with cane on left UE   Assistive device Straight cane   with rubber quad tip, right ASO   Gait Pattern Step-to pattern;Step-through pattern;Decreased step length - right;Decreased step length - left    Ambulation Surface Level;Indoor      Exercises   Exercises Other Exercises    Other Exercises  issued HEP for strengthening. Refer to Medbridge for full details. Mod cues needed for correct for with all ex's.           Issued to HEP today: Access Code: [email or web address] URL: [email or web address] Date: 07/04/2020 Prepared by: Sallyanne Kuster  Exercises Sit to Stand with  Counter Support - 1 x daily - 5 x weekly - 1 sets - 10 reps Seated Hip Abduction with Resistance - 1 x daily - 5 x weekly - 1 sets - 10 reps - 5 hold Seated March - 1 x daily - 5 x weekly - 1 sets - 10 reps Seated Ankle Plantar Flexion with Resistance Loop - 1 x daily - 5 x weekly - 1 sets - 10 reps        PT Education - 07/04/20 1531    Education Details HEP for strengthening issued today. worked on gait with cane on left side.    Person(s) Educated Patient    Methods Explanation;Demonstration;Verbal cues;Handout;Tactile cues    Comprehension Verbalized understanding;Returned demonstration;Verbal cues required;Tactile cues required;Need further instruction            PT Short Term Goals - 07/02/20 2049      PT SHORT TERM GOAL #1   Title Pt will be independent with initial HEP for strengthening/ROM and balance.    Time 4     Period Weeks    Status New    Target Date 07/18/20      PT SHORT TERM GOAL #2   Title Berg Balance will be assessed and LTG written to further assess balance.    Baseline performed on 07/02/20 with 31/56    Time 4    Period Weeks    Status Achieved    Target Date 07/18/20      PT SHORT TERM GOAL #3   Title Pt will decrease TUG from 24 sec to <20 sec for improved balance and functional mobility.    Baseline 06/18/20 24 sec    Time 4    Period Weeks    Status New    Target Date 07/18/20      PT SHORT TERM GOAL #4   Title Pt will ambulate >300' mod I with RW versus cane on level surfaces for improved household mobility and safety.    Time 4    Period Weeks    Status New    Target Date 07/18/20             PT Long Term Goals - 07/02/20 2050      PT LONG TERM GOAL #1   Title Pt will be independent with progressive HEP for strengthening/ROM and balance to continue gains on own.    Time 8    Period Weeks    Status New      PT LONG TERM GOAL #2   Title Pt will be able to perform sit to stand x 5 without hands for improved functional strength safely.    Baseline 1 rep without hands CGA.    Time 8    Period Weeks    Status New      PT LONG TERM GOAL #3   Title Pt will increase gait speed from 0.22m/s to >0.97m/s for improved safety with household mobility.    Baseline 0.53m/s on 06/18/20    Time 8    Period Weeks    Status New      PT LONG TERM GOAL #4   Title Pt will ambulate >500' on outdoor paved surfaces with LRAD mod I for improved short community distances.    Time 8    Period Weeks    Status New      PT LONG TERM GOAL #5   Title Pt will ambulate up/down flight of stairs with cane and 1 railing mod I  for improved access to home.    Time 8    Period Weeks    Status New      PT LONG TERM GOAL #6   Title Pt will increase Berg Balance from 31/56 to >36/56 for improved blance and decreased fall risk.    Baseline 07/02/20 31/56    Time 8    Period Weeks     Status New                 Plan - 07/04/20 1454    Clinical Impression Statement Today's skilled session focused gait with cane and establishment of an HEP. Pt unsteady with cane on left side today needing up to mod assist at times. Pt reports increased fear with cane on left side. The pt is progressing toward goals and should benefit from continued PT to progress toward unmet goals.    Personal Factors and Comorbidities Comorbidity 1    Comorbidities depression    Examination-Activity Limitations Stand;Locomotion Level;Stairs;Transfers    Examination-Participation Restrictions Community Activity;Cleaning    Stability/Clinical Decision Making Stable/Uncomplicated    Rehab Potential Good    PT Frequency 2x / week   plus eval   PT Duration 8 weeks    PT Treatment/Interventions ADLs/Self Care Home Management;Cryotherapy;Moist Heat;DME Instruction;Gait training;Stair training;Functional mobility training;Therapeutic activities;Therapeutic exercise;Orthotic Fit/Training;Patient/family education;Neuromuscular re-education;Balance training;Passive range of motion;Vestibular;Manual techniques    PT Next Visit Plan Continue to work on LE strengthening, begin balance training on solid surfaces.    Consulted and Agree with Plan of Care Patient           Patient will benefit from skilled therapeutic intervention in order to improve the following deficits and impairments:  Abnormal gait, Decreased activity tolerance, Decreased balance, Decreased endurance, Decreased mobility, Decreased knowledge of use of DME, Decreased range of motion, Decreased strength, Impaired flexibility, Impaired sensation  Visit Diagnosis: Other abnormalities of gait and mobility  Muscle weakness (generalized)  Unsteadiness on feet     Problem List Patient Active Problem List   Diagnosis Date Noted  . Hyperglycemia 07/09/2019  . Constipation 04/29/2015  . Encounter to establish care 04/29/2015  . Peripheral  neuropathy 09/07/2012  . Charcot-Marie-Tooth disease 01/31/2012  . Hypothyroid 01/31/2012  . Atrial septal defect 01/31/2012  . Hyperlipidemia 01/31/2012  . GERD (gastroesophageal reflux disease) 01/31/2012  . Osteoarthritis 01/31/2012  . Osteoporosis 01/31/2012  . Depression 01/31/2012  . ADD (attention deficit disorder) 01/31/2012  . Fibromyalgia 01/31/2012  . Allergic rhinitis 01/31/2012    Sallyanne Kuster, PTA, [facility] [address] [place], [place] [address] [phone] 07/04/20, 4:12 PM   Name: Alexandra Norman MRN: [id number] Date of Birth: April 22, 1956

## 2020-07-04 NOTE — Patient Instructions (Signed)
Access Code: [email or web address] URL: [email or web address] Date: 07/04/2020 Prepared by: Sallyanne Kuster  Exercises Sit to Stand with Counter Support - 1 x daily - 5 x weekly - 1 sets - 10 reps Seated Hip Abduction with Resistance - 1 x daily - 5 x weekly - 1 sets - 10 reps - 5 hold Seated March - 1 x daily - 5 x weekly - 1 sets - 10 reps Seated Ankle Plantar Flexion with Resistance Loop - 1 x daily - 5 x weekly - 1 sets - 10 reps

## 2020-07-07 ENCOUNTER — Telehealth: Payer: Self-pay

## 2020-07-07 NOTE — Telephone Encounter (Signed)
I printed order and this will need to be faxed to number indicated by PT.

## 2020-07-07 NOTE — Telephone Encounter (Signed)
Paperwork was faxed to [phone] and a confirmation fax was received that this went through.

## 2020-07-07 NOTE — Telephone Encounter (Signed)
Rolling wheelchair order faxed to [employer] at [phone].

## 2020-07-08 ENCOUNTER — Other Ambulatory Visit: Payer: Self-pay

## 2020-07-08 ENCOUNTER — Ambulatory Visit: Payer: Medicare Other

## 2020-07-08 DIAGNOSIS — M6281 Muscle weakness (generalized): Secondary | ICD-10-CM

## 2020-07-08 DIAGNOSIS — R2689 Other abnormalities of gait and mobility: Secondary | ICD-10-CM | POA: Diagnosis not present

## 2020-07-08 DIAGNOSIS — R2681 Unsteadiness on feet: Secondary | ICD-10-CM

## 2020-07-08 NOTE — Therapy (Signed)
[facility] [address] [place], [place], [address] Phone: [phone]   Fax:  [phone]  Physical Therapy Treatment  Patient Details  Name: Alexandra Norman MRN: [id number] Date of Birth: [birth year] Referring Provider (PT): Evelena Peat   Encounter Date: 07/08/2020   PT End of Session - 07/08/20 1615    Visit Number 4    Number of Visits 17    Date for PT Re-Evaluation 09/16/20   90 day cert, 60 day poc   Authorization Type Medicare so 10th visit progress note.    PT Start Time 1534    PT Stop Time 1616    PT Time Calculation (min) 42 min    Equipment Utilized During Treatment Gait belt    Activity Tolerance Patient tolerated treatment well    Behavior During Therapy WFL for tasks assessed/performed           Past Medical History:  Diagnosis Date  . ADD (attention deficit disorder)   . Allergy   . Arrhythmia   . Arthritis   . Charcot-Marie-Tooth disease   . Depression   . Fibromyalgia   . Headache(784.0)   . Heart murmur   . Hyperlipidemia   . Osteoarthritis   . Osteoporosis   . Rotator cuff tear   . Thyroid disease   . Ulcer   . UTI (urinary tract infection)     Past Surgical History:  Procedure Laterality Date  . CARDIAC SURGERY  1982   ASD repair   . CARPAL TUNNEL RELEASE  2007   bilateral  . CATARACT EXTRACTION, BILATERAL Bilateral   . FOOT SURGERY  2001 and 2005   bilateral  . HERNIA REPAIR    . WRIST SURGERY Left 08/12/2017    There were no vitals filed for this visit.   Subjective Assessment - 07/08/20 1539    Subjective Pt reports almost falling in shower 2x. Pt reports using crystals and meditation to muster up courage to come to PT today.    Patient Stated Goals Pt wants to increase her muscle tone to try to improve strength and balance.    Currently in Pain? No/denies                             [facility] Adult PT Treatment/Exercise - 07/08/20 0001        Transfers   Transfers Sit to Stand;Stand to Sit    Sit to Stand 5: Supervision;With upper extremity assist      Neuro Re-ed    Neuro Re-ed Details  Pt was instructed in various static and dynamic balance training activities in order to train limits of stability and prevent falls. Pt was instructed in static standing with feet together x 2 min. PT progressed to eyes closed for an additional 2 minutes with 2-finger support on parallel bars. Pt required CGA for static balance training with mild postural sway and moderate postural sway with eyes closed. Pt then instructed in static standing with feet together on compliant surface x 1 minute with feet together and 2-finger support at parallel bars. PT progressed to eyes closed on compliant surface with feet together using BUE support on parallel bars. After 1 minute, pt progressed to 2-finger support bil for an additional 30 sec. Pt was then instructed in alternating toe taps x 12 reps using parallel bars for UE support. Pt then instructed in forward stepping over 2 2x4 foam beams with reciprocal pattern x  4 reps using cane and CGA. Pt then instructed in side stepping over 2 2x4 beams L<>R x 4 reps while using bil UE support on parallel bar. Pt was then instructed in obstacle course navigation x 2 reps which including walking on compliant surface with SPC, stepping over cones leading with alternating feet, making sharp turns around cones and stepping on/off rockerboard. Pt required CGA-min A during obstacle course navigation with 1 LOB when stepping over obstacle leading with RLE, which required mod A to correct.       Exercises   Exercises Knee/Hip      Knee/Hip Exercises: Supine   Bridges Limitations Pt was instructed in proper exercise performance of gluteal bridges with hip abudctions including proper ROM, hold and squeze glutes at the top of bridge and slow eccentric lowering. Pt also cued on propre abduction ROM with red theraband applied above knees.     Bridges with Clamshell 15 reps    Straight Leg Raises 10 reps   5 for each lower extremite   Straight Leg Raises Limitations Pt was isntructed in proper setup and exericse performance including proper ROM, proper pacing and proper muscle activation techniques. Pt cued to squeeze thigh muscles (quad set) and DF foot during SLR performance.                  PT Education - 07/08/20 1638    Education Details Pt educated on proper exercise mechanics for SLR and Bridges with clamshells. PT modified HEP and updated on Medbridge.    Person(s) Educated Patient    Methods Explanation;Demonstration;Other (comment)   Medbridge videos   Comprehension Verbalized understanding            PT Short Term Goals - 07/02/20 2049      PT SHORT TERM GOAL #1   Title Pt will be independent with initial HEP for strengthening/ROM and balance.    Time 4    Period Weeks    Status New    Target Date 07/18/20      PT SHORT TERM GOAL #2   Title Berg Balance will be assessed and LTG written to further assess balance.    Baseline performed on 07/02/20 with 31/56    Time 4    Period Weeks    Status Achieved    Target Date 07/18/20      PT SHORT TERM GOAL #3   Title Pt will decrease TUG from 24 sec to <20 sec for improved balance and functional mobility.    Baseline 06/18/20 24 sec    Time 4    Period Weeks    Status New    Target Date 07/18/20      PT SHORT TERM GOAL #4   Title Pt will ambulate >300' mod I with RW versus cane on level surfaces for improved household mobility and safety.    Time 4    Period Weeks    Status New    Target Date 07/18/20             PT Long Term Goals - 07/02/20 2050      PT LONG TERM GOAL #1   Title Pt will be independent with progressive HEP for strengthening/ROM and balance to continue gains on own.    Time 8    Period Weeks    Status New      PT LONG TERM GOAL #2   Title Pt will be able to perform sit to stand x 5 without hands for  improved  functional strength safely.    Baseline 1 rep without hands CGA.    Time 8    Period Weeks    Status New      PT LONG TERM GOAL #3   Title Pt will increase gait speed from 0.7m/s to >0.27m/s for improved safety with household mobility.    Baseline 0.24m/s on 06/18/20    Time 8    Period Weeks    Status New      PT LONG TERM GOAL #4   Title Pt will ambulate >500' on outdoor paved surfaces with LRAD mod I for improved short community distances.    Time 8    Period Weeks    Status New      PT LONG TERM GOAL #5   Title Pt will ambulate up/down flight of stairs with cane and 1 railing mod I for improved access to home.    Time 8    Period Weeks    Status New      PT LONG TERM GOAL #6   Title Pt will increase Berg Balance from 31/56 to >36/56 for improved blance and decreased fall risk.    Baseline 07/02/20 31/56    Time 8    Period Weeks    Status New                 Plan - 07/08/20 1616    Clinical Impression Statement Today's skilled PT session focused on static and dynamic balance training as well as promoting functional balance during gait activites and obstacle navigation. Pt was also instructed in proximal hip strengthening per the literature regarding Charcot marie tooth dx. PT added SLR and Bridges + clamshell  with red TB to HEP. Pt is making excellent progress toward goals, especialy with balance. Pt would benefit from continued PT services in order ton continue balance training and functional strengthening.    Personal Factors and Comorbidities Comorbidity 1    Comorbidities depression    Examination-Activity Limitations Stand;Locomotion Level;Stairs;Transfers    Examination-Participation Restrictions Community Activity;Cleaning    Stability/Clinical Decision Making Stable/Uncomplicated    Rehab Potential Good    PT Frequency 2x / week   plus eval   PT Duration 8 weeks    PT Treatment/Interventions ADLs/Self Care Home Management;Cryotherapy;Moist Heat;DME  Instruction;Gait training;Stair training;Functional mobility training;Therapeutic activities;Therapeutic exercise;Orthotic Fit/Training;Patient/family education;Neuromuscular re-education;Balance training;Passive range of motion;Vestibular;Manual techniques    PT Next Visit Plan Continue to work on proximal LE strengthening, continue to progress balance training to SLS and more compliant surfaces    Consulted and Agree with Plan of Care Patient           Patient will benefit from skilled therapeutic intervention in order to improve the following deficits and impairments:  Abnormal gait, Decreased activity tolerance, Decreased balance, Decreased endurance, Decreased mobility, Decreased knowledge of use of DME, Decreased range of motion, Decreased strength, Impaired flexibility, Impaired sensation  Visit Diagnosis: Other abnormalities of gait and mobility  Muscle weakness (generalized)  Unsteadiness on feet  Charcot-Marie disease     Problem List Patient Active Problem List   Diagnosis Date Noted  . Hyperglycemia 07/09/2019  . Constipation 04/29/2015  . Encounter to establish care 04/29/2015  . Peripheral neuropathy 09/07/2012  . Charcot-Marie-Tooth disease 01/31/2012  . Hypothyroid 01/31/2012  . Atrial septal defect 01/31/2012  . Hyperlipidemia 01/31/2012  . GERD (gastroesophageal reflux disease) 01/31/2012  . Osteoarthritis 01/31/2012  . Osteoporosis 01/31/2012  . Depression 01/31/2012  . ADD (attention deficit disorder) 01/31/2012  .  Fibromyalgia 01/31/2012  . Allergic rhinitis 01/31/2012    Isabella Bowens, SPT 07/08/2020, 4:52 PM  [facility] [address] [place], [place], [address] Phone: [phone]   Fax:  [phone]  Name: Alexandra Norman MRN: [id number] Date of Birth: [birth year]

## 2020-07-08 NOTE — Patient Instructions (Signed)
Access Code: [email or web address] URL: [email or web address] Date: 07/08/2020 Prepared by: Elmer Bales  Exercises Sit to Stand with Counter Support - 1 x daily - 5 x weekly - 1 sets - 10 reps Seated Hip Abduction with Resistance - 1 x daily - 5 x weekly - 1 sets - 10 reps - 5 hold Seated March - 1 x daily - 5 x weekly - 1 sets - 10 reps Seated Ankle Plantar Flexion with Resistance Loop - 1 x daily - 5 x weekly - 1 sets - 10 reps Small Range Straight Leg Raise - 1 x daily - 5 x weekly - 1 sets - 10 reps Bridge with Hip Abduction and Resistance - 1 x daily - 5 x weekly - 1 sets - 10 reps

## 2020-07-10 ENCOUNTER — Ambulatory Visit: Payer: Medicare Other

## 2020-07-15 ENCOUNTER — Ambulatory Visit: Payer: Medicare Other

## 2020-07-15 ENCOUNTER — Other Ambulatory Visit: Payer: Self-pay

## 2020-07-15 DIAGNOSIS — R2689 Other abnormalities of gait and mobility: Secondary | ICD-10-CM | POA: Diagnosis not present

## 2020-07-15 DIAGNOSIS — M6281 Muscle weakness (generalized): Secondary | ICD-10-CM

## 2020-07-15 DIAGNOSIS — R2681 Unsteadiness on feet: Secondary | ICD-10-CM

## 2020-07-15 NOTE — Therapy (Signed)
[facility] [address] [place], [place], [address] Phone: [phone]   Fax:  [phone]  Physical Therapy Treatment  Patient Details  Name: Alexandra Norman MRN: [id number] Date of Birth: [birth year] Referring Provider (PT): Carolann Littler   Encounter Date: 07/15/2020   PT End of Session - 07/15/20 1733    Visit Number 5    Number of Visits 17    Date for PT Re-Evaluation 01/00/71   90 day cert, 60 day poc   Authorization Type Medicare so 10th visit progress note.    PT Start Time 1532    PT Stop Time 1615    PT Time Calculation (min) 43 min    Equipment Utilized During Treatment Gait belt    Activity Tolerance Patient tolerated treatment well    Behavior During Therapy WFL for tasks assessed/performed           Past Medical History:  Diagnosis Date  . ADD (attention deficit disorder)   . Allergy   . Arrhythmia   . Arthritis   . Charcot-Marie-Tooth disease   . Depression   . Fibromyalgia   . Headache(784.0)   . Heart murmur   . Hyperlipidemia   . Osteoarthritis   . Osteoporosis   . Rotator cuff tear   . Thyroid disease   . Ulcer   . UTI (urinary tract infection)     Past Surgical History:  Procedure Laterality Date  . CARDIAC SURGERY  1982   ASD repair   . CARPAL TUNNEL RELEASE  2007   bilateral  . CATARACT EXTRACTION, BILATERAL Bilateral   . FOOT SURGERY  2001 and 2005   bilateral  . HERNIA REPAIR    . WRIST SURGERY Left 08/12/2017    There were no vitals filed for this visit.   Subjective Assessment - 07/15/20 1540    Subjective Pt reports being sore after last session but took an epsom salt bath which helped. Pt reported a weakness coming over her from bil hips all the way down starting thrusday and resolving saturday. Pt reports no new falls. Pt reports not being diligent with exercises and doing them about every other day. Pt reports feeling wobbly today but no pain.    Patient  Stated Goals Pt wants to increase her muscle tone to try to improve strength and balance.    Currently in Pain? No/denies                             [facility] Adult PT Treatment/Exercise - 07/15/20 1543      Ambulation/Gait   Ambulation/Gait Yes    Ambulation/Gait Assistance 4: Min guard;5: Supervision    Ambulation/Gait Assistance Details decreased right heel strike. Pt looks down when walking. Decreased R foot clearance.    Ambulation Distance (Feet) 315 Feet    Assistive device Straight cane   quad tip   Gait Pattern Step-through pattern;Decreased stance time - right;Decreased step length - left;Decreased dorsiflexion - right    Ambulation Surface Level;Indoor    Gait Comments Pt uses cane on right side. Pt cued on proper heel strike and adequate step length with RLE. Pt also cued on trying to look straight ahead rather than looking down at feet. Pt cued to maintain upright posture. Pt instructed in additional round of gait training in // bars x 40' focusing specifically on looking straight ahead when ambulating in order to improve safety awareness and prevent falls. Pt  with min cueing to keep eyes forward rather than looking down.      Standardized Balance Assessment   Standardized Balance Assessment Timed Up and Go Test      Timed Up and Go Test   TUG Normal TUG    Normal TUG (seconds) 13      Neuro Re-ed    Neuro Re-ed Details  Pt was instructed in standing with normal BOS on compliant surface (airex) for 1 minute followed my alternatng marches x 10 reps in parallel bars using CGA. Pt with minimal postural sway with standing on airex and bil 2-finger support. No buckling or LOB noted. Pt then instructed in rockerboard training in the sagittal plane including balancing self for 1 min with bil UE support in // bars and then controlled rocking back and forth x 15 reps. Pt with CGA and no LOB. Pt did stumble when attempting to step forward off rockerboard.       Exercises     Exercises Other Exercises    Other Exercises  Pt instructed in standing bil hip abduction using counter for UE support, completing 10 reps on each side with close supervision.      Ambulation   Ambulation/Gait Assistance Details Verbal cues for gait pattern;Verbal cues for technique;Verbal cues for precautions/safety;Verbal cues for safe use of DME/AE                  PT Education - 07/15/20 1617    Education Details Pt educated on proper gait mechanics and safety awarness with ambulation. Pt has tendency to look down when walking and not even intermittently look up causing her to bump into people and object.    Person(s) Educated Patient    Methods Explanation;Demonstration    Comprehension Verbalized understanding;Verbal cues required;Need further instruction            PT Short Term Goals - 07/15/20 1740      PT SHORT TERM GOAL #1   Title Pt will be independent with initial HEP for strengthening/ROM and balance.    Time 4    Period Weeks    Status On-going    Target Date 07/18/20      PT SHORT TERM GOAL #2   Title Berg Balance will be assessed and LTG written to further assess balance.    Baseline performed on 07/02/20 with 31/56    Time 4    Period Weeks    Status Achieved    Target Date 07/18/20      PT SHORT TERM GOAL #3   Title Pt will decrease TUG from 24 sec to <20 sec for improved balance and functional mobility.    Baseline 06/18/20 24 sec; 07/15/20 13 s TUG - goal met.    Time 4    Period Weeks    Status Achieved    Target Date 07/18/20      PT SHORT TERM GOAL #4   Title Pt will ambulate >300' mod I with RW versus cane on level surfaces for improved household mobility and safety.    Baseline 07/15/20 315' supervision with cane    Time 4    Period Weeks    Status Partially Met    Target Date 07/18/20             PT Long Term Goals - 07/02/20 2050      PT LONG TERM GOAL #1   Title Pt will be independent with progressive HEP for  strengthening/ROM and balance to continue  gains on own.    Time 8    Period Weeks    Status New      PT LONG TERM GOAL #2   Title Pt will be able to perform sit to stand x 5 without hands for improved functional strength safely.    Baseline 1 rep without hands CGA.    Time 8    Period Weeks    Status New      PT LONG TERM GOAL #3   Title Pt will increase gait speed from 0.38ms to >0.539m for improved safety with household mobility.    Baseline 0.3461mon 06/18/20    Time 8    Period Weeks    Status New      PT LONG TERM GOAL #4   Title Pt will ambulate >500' on outdoor paved surfaces with LRAD mod I for improved short community distances.    Time 8    Period Weeks    Status New      PT LONG TERM GOAL #5   Title Pt will ambulate up/down flight of stairs with cane and 1 railing mod I for improved access to home.    Time 8    Period Weeks    Status New      PT LONG TERM GOAL #6   Title Pt will increase Berg Balance from 31/56 to >36/56 for improved blance and decreased fall risk.    Baseline 07/02/20 31/56    Time 8    Period Weeks    Status New                 Plan - 07/15/20 1736    Clinical Impression Statement Today's skilled PT session focused on static and dynamic balance training, proximal hip strengthening, bil LE strengthening, and gait training. STGs were reassessed. Pt has improved signifcantly on TUG decreasing time from 24 s to 13 s, thus meeting STG. Pt's gait and activity tolerance has also improved as pt ambulated 315' today with no complaints of pain or fatigue and no rest breaks. Pt is making excellent progress towards LTGs.    Personal Factors and Comorbidities Comorbidity 1    Comorbidities depression    Examination-Activity Limitations Stand;Locomotion Level;Stairs;Transfers    Examination-Participation Restrictions Community Activity;Cleaning    Stability/Clinical Decision Making Stable/Uncomplicated    Rehab Potential Good    PT Frequency 2x /  week   plus eval   PT Duration 8 weeks    PT Treatment/Interventions ADLs/Self Care Home Management;Cryotherapy;Moist Heat;DME Instruction;Gait training;Stair training;Functional mobility training;Therapeutic activities;Therapeutic exercise;Orthotic Fit/Training;Patient/family education;Neuromuscular re-education;Balance training;Passive range of motion;Vestibular;Manual techniques    PT Next Visit Plan Finish checking STGs.Continue to work on proximal LE strengthening, continue to progress balance training to SLS and more compliant surfaces. Assess safety and proper sequencing with stairs since she has 12 stairs to get into her home.    Consulted and Agree with Plan of Care Patient           Patient will benefit from skilled therapeutic intervention in order to improve the following deficits and impairments:  Abnormal gait, Decreased activity tolerance, Decreased balance, Decreased endurance, Decreased mobility, Decreased knowledge of use of DME, Decreased range of motion, Decreased strength, Impaired flexibility, Impaired sensation  Visit Diagnosis: Other abnormalities of gait and mobility  Muscle weakness (generalized)  Unsteadiness on feet     Problem List Patient Active Problem List   Diagnosis Date Noted  . Hyperglycemia 07/09/2019  . Constipation 04/29/2015  . Encounter to  establish care 04/29/2015  . Peripheral neuropathy 09/07/2012  . Charcot-Marie-Tooth disease 01/31/2012  . Hypothyroid 01/31/2012  . Atrial septal defect 01/31/2012  . Hyperlipidemia 01/31/2012  . GERD (gastroesophageal reflux disease) 01/31/2012  . Osteoarthritis 01/31/2012  . Osteoporosis 01/31/2012  . Depression 01/31/2012  . ADD (attention deficit disorder) 01/31/2012  . Fibromyalgia 01/31/2012  . Allergic rhinitis 01/31/2012    Rosalita Levan, SPT 07/16/2020, 9:15 AM  [facility] [address] [place], [place], [address] Phone:  [phone]   Fax:  [phone]  Name: Gayle Martinez MRN: [id number] Date of Birth: [birth year]

## 2020-07-17 ENCOUNTER — Other Ambulatory Visit: Payer: Self-pay

## 2020-07-17 ENCOUNTER — Ambulatory Visit: Payer: Medicare Other

## 2020-07-17 DIAGNOSIS — M6281 Muscle weakness (generalized): Secondary | ICD-10-CM

## 2020-07-17 DIAGNOSIS — R2681 Unsteadiness on feet: Secondary | ICD-10-CM

## 2020-07-17 DIAGNOSIS — R2689 Other abnormalities of gait and mobility: Secondary | ICD-10-CM

## 2020-07-17 NOTE — Therapy (Signed)
[facility] [address] [place], [place], [address] Phone: [phone]   Fax:  [phone]  Physical Therapy Treatment  Patient Details  Name: Charlane Westry MRN: [id number] Date of Birth: 10-28-1955 Referring Provider (PT): Carolann Littler   Encounter Date: 07/17/2020   PT End of Session - 07/17/20 1714    Visit Number 6    Number of Visits 17    Date for PT Re-Evaluation 09/16/20    Authorization Type Medicare so 10th visit progress note.    PT Start Time 1616    PT Stop Time 1655    PT Time Calculation (min) 39 min    Equipment Utilized During Treatment Gait belt    Activity Tolerance Patient tolerated treatment well    Behavior During Therapy WFL for tasks assessed/performed           Past Medical History:  Diagnosis Date  . ADD (attention deficit disorder)   . Allergy   . Arrhythmia   . Arthritis   . Charcot-Marie-Tooth disease   . Depression   . Fibromyalgia   . Headache(784.0)   . Heart murmur   . Hyperlipidemia   . Osteoarthritis   . Osteoporosis   . Rotator cuff tear   . Thyroid disease   . Ulcer   . UTI (urinary tract infection)     Past Surgical History:  Procedure Laterality Date  . CARDIAC SURGERY  1982   ASD repair   . CARPAL TUNNEL RELEASE  2007   bilateral  . CATARACT EXTRACTION, BILATERAL Bilateral   . FOOT SURGERY  2001 and 2005   bilateral  . HERNIA REPAIR    . WRIST SURGERY Left 08/12/2017    There were no vitals filed for this visit.   Subjective Assessment - 07/17/20 1619    Subjective Pt reports being sore after last session but took applied biofreeze and it helped. Pt reports starting bike at home for the first time since buying it 2 years ago. Pt was able to tolerate 3.5 minutes and was sore after. Pt is requesting inserts for lateral support in shoe to prevent lateral rolling out during functional mobility. PT recommends podiatrist but will also aske orthotist  about this and get back to pt.    Patient Stated Goals Pt wants to increase her muscle tone to try to improve strength and balance.    Currently in Pain? No/denies                             [facility] Adult PT Treatment/Exercise - 07/17/20 1623      Transfers   Transfers Sit to Stand;Stand to Sit    Sit to Stand 5: Supervision;With upper extremity assist      Ambulation/Gait   Ambulation/Gait Yes    Ambulation/Gait Assistance 5: Supervision;4: Min assist    Ambulation/Gait Assistance Details Pt with inversion/IR of RLE during swing phase of gait. Pt with R swaying and mild foot slap on LLE (poorly controlled PF from heel strike to midstance due to weak ankle musculature).    Ambulation Distance (Feet) 450 Feet    Assistive device Straight cane   quad tip   Gait Pattern Step-through pattern;Decreased stance time - right;Decreased step length - right;Decreased step length - left    Ambulation Surface Outdoor;Unlevel    Stairs Yes    Stairs Assistance 4: Min guard    Stair Management Technique Alternating pattern;Step to pattern  alternating going up and step-to going down   Number of Stairs 12    Height of Stairs 6    Gait Comments Pt uses cane on right side. Pt cued on proper heel strike and adequate step length with RLE. Pt also cued on trying to look straight ahead rather than looking down at feet. Pt cued to maintain upright posture. Pt instructed in additional round of gait training in // bars x 40' focusing specifically on looking straight ahead when ambulating in order to improve safety awareness and prevent falls. Pt with min cueing to keep eyes forward rather than looking down. Pt cued on proper setup and sequencing with stair training.      Neuro Re-ed    Neuro Re-ed Details  Pt was instructed in dynamic balance training including rockerboard training for lateral stability. Pt was challenged to standing for 1 minute with 1 hand on countertop for support. Pt was  then trained in maintaining level rockerboard while stacking and unstacking 8 cones. Pt was then trained in slow, controlled lateral rocking to improve ankle stability and dynamic balance with UE support on countertop. Pt demos ability to complete 12 reps with CGA.                   PT Education - 07/17/20 1713    Education Details Pt educated on proper HEP performance and addition of exercise. Pt also educated on proper setup and sequencing with stait training.    Person(s) Educated Patient    Methods Explanation;Demonstration    Comprehension Verbalized understanding            PT Short Term Goals - 07/18/20 0746      PT SHORT TERM GOAL #1   Title Pt will be independent with initial HEP for strengthening/ROM and balance.    Baseline Pt reports doing exercises every other day. Denies any questions on current exercises as of 07/17/20    Time 4    Period Weeks    Status Achieved    Target Date 07/18/20      PT SHORT TERM GOAL #2   Title Berg Balance will be assessed and LTG written to further assess balance.    Baseline performed on 07/02/20 with 31/56    Time 4    Period Weeks    Status Achieved    Target Date 07/18/20      PT SHORT TERM GOAL #3   Title Pt will decrease TUG from 24 sec to <20 sec for improved balance and functional mobility.    Baseline 06/18/20 24 sec; 07/15/20 13 s TUG - goal met.    Time 4    Period Weeks    Status Achieved    Target Date 07/18/20      PT SHORT TERM GOAL #4   Title Pt will ambulate >300' mod I with RW versus cane on level surfaces for improved household mobility and safety.    Baseline 07/15/20 315' supervision with cane    Time 4    Period Weeks    Status Partially Met    Target Date 07/18/20             PT Long Term Goals - 07/02/20 2050      PT LONG TERM GOAL #1   Title Pt will be independent with progressive HEP for strengthening/ROM and balance to continue gains on own.    Time 8    Period Weeks    Status New  PT LONG TERM GOAL #2   Title Pt will be able to perform sit to stand x 5 without hands for improved functional strength safely.    Baseline 1 rep without hands CGA.    Time 8    Period Weeks    Status New      PT LONG TERM GOAL #3   Title Pt will increase gait speed from 0.86ms to >0.575m for improved safety with household mobility.    Baseline 0.3497mon 06/18/20    Time 8    Period Weeks    Status New      PT LONG TERM GOAL #4   Title Pt will ambulate >500' on outdoor paved surfaces with LRAD mod I for improved short community distances.    Time 8    Period Weeks    Status New      PT LONG TERM GOAL #5   Title Pt will ambulate up/down flight of stairs with cane and 1 railing mod I for improved access to home.    Time 8    Period Weeks    Status New      PT LONG TERM GOAL #6   Title Pt will increase Berg Balance from 31/56 to >36/56 for improved blance and decreased fall risk.    Baseline 07/02/20 31/56    Time 8    Period Weeks    Status New                 Plan - 07/17/20 1716    Clinical Impression Statement Today's skilled PT session focused on outdoor gait training, dynamic balance training, and stair training. Pt is making good progress towards STGs. Pt is steadily increasing gait tolerance and dynamic balance as well as overall activity tolerance. PT to continue to progress towards LTGs.    Personal Factors and Comorbidities Comorbidity 1    Comorbidities depression    Examination-Activity Limitations Stand;Locomotion Level;Stairs;Transfers    Examination-Participation Restrictions Community Activity;Cleaning    Stability/Clinical Decision Making Stable/Uncomplicated    Rehab Potential Good    PT Frequency 2x / week   plus eval   PT Duration 8 weeks    PT Treatment/Interventions ADLs/Self Care Home Management;Cryotherapy;Moist Heat;DME Instruction;Gait training;Stair training;Functional mobility training;Therapeutic activities;Therapeutic  exercise;Orthotic Fit/Training;Patient/family education;Neuromuscular re-education;Balance training;Passive range of motion;Vestibular;Manual techniques    PT Next Visit Plan Continue to work on proximal LE strengthening, continue to progress balance training to SLS and more compliant surfaces.    Consulted and Agree with Plan of Care Patient           Patient will benefit from skilled therapeutic intervention in order to improve the following deficits and impairments:  Abnormal gait, Decreased activity tolerance, Decreased balance, Decreased endurance, Decreased mobility, Decreased knowledge of use of DME, Decreased range of motion, Decreased strength, Impaired flexibility, Impaired sensation  Visit Diagnosis: Other abnormalities of gait and mobility  Muscle weakness (generalized)  Unsteadiness on feet     Problem List Patient Active Problem List   Diagnosis Date Noted  . Hyperglycemia 07/09/2019  . Constipation 04/29/2015  . Encounter to establish care 04/29/2015  . Peripheral neuropathy 09/07/2012  . Charcot-Marie-Tooth disease 01/31/2012  . Hypothyroid 01/31/2012  . Atrial septal defect 01/31/2012  . Hyperlipidemia 01/31/2012  . GERD (gastroesophageal reflux disease) 01/31/2012  . Osteoarthritis 01/31/2012  . Osteoporosis 01/31/2012  . Depression 01/31/2012  . ADD (attention deficit disorder) 01/31/2012  . Fibromyalgia 01/31/2012  . Allergic rhinitis 01/31/2012    DanRosalita LevanPT 07/18/2020,  7:47 AM  [facility] [address] [place], [place], [address] Phone: [phone]   Fax:  [phone]  Name: Rheta Hemmelgarn MRN: [id number] Date of Birth: 1956-06-23

## 2020-07-17 NOTE — Patient Instructions (Signed)
Access Code: [email or web address] URL: [email or web address] Date: 07/17/2020 Prepared by: Elmer Bales  Exercises Sit to Stand with Counter Support - 1 x daily - 5 x weekly - 1 sets - 10 reps Seated Hip Abduction with Resistance - 1 x daily - 5 x weekly - 1 sets - 10 reps - 5 hold Seated March - 1 x daily - 5 x weekly - 1 sets - 10 reps Seated Ankle Plantar Flexion with Resistance Loop - 1 x daily - 5 x weekly - 1 sets - 10 reps Small Range Straight Leg Raise - 1 x daily - 5 x weekly - 1 sets - 10 reps Bridge with Hip Abduction and Resistance - 1 x daily - 5 x weekly - 1 sets - 10 reps Standing Hip Abduction with Counter Support - 1 x daily - 5 x weekly - 1 sets - 10 reps

## 2020-07-22 ENCOUNTER — Ambulatory Visit: Payer: Medicare Other

## 2020-07-22 ENCOUNTER — Other Ambulatory Visit: Payer: Self-pay

## 2020-07-22 DIAGNOSIS — R2681 Unsteadiness on feet: Secondary | ICD-10-CM

## 2020-07-22 DIAGNOSIS — M6281 Muscle weakness (generalized): Secondary | ICD-10-CM

## 2020-07-22 DIAGNOSIS — R2689 Other abnormalities of gait and mobility: Secondary | ICD-10-CM

## 2020-07-22 NOTE — Therapy (Signed)
[facility] [address] [place], [place], [address] Phone: [phone]   Fax:  [phone]  Physical Therapy Treatment  Patient Details  Name: Alexandra Norman MRN: [id number] Date of Birth: [birth year] Referring Provider (PT): Carolann Littler   Encounter Date: 07/22/2020   PT End of Session - 07/22/20 1708    Visit Number 7    Number of Visits 17    Date for PT Re-Evaluation 09/16/20    Authorization Type Medicare so 10th visit progress note.    PT Start Time 0332    PT Stop Time 0416    PT Time Calculation (min) 44 min    Equipment Utilized During Treatment Gait belt    Activity Tolerance Patient tolerated treatment well    Behavior During Therapy WFL for tasks assessed/performed           Past Medical History:  Diagnosis Date  . ADD (attention deficit disorder)   . Allergy   . Arrhythmia   . Arthritis   . Charcot-Marie-Tooth disease   . Depression   . Fibromyalgia   . Headache(784.0)   . Heart murmur   . Hyperlipidemia   . Osteoarthritis   . Osteoporosis   . Rotator cuff tear   . Thyroid disease   . Ulcer   . UTI (urinary tract infection)     Past Surgical History:  Procedure Laterality Date  . CARDIAC SURGERY  1982   ASD repair   . CARPAL TUNNEL RELEASE  2007   bilateral  . CATARACT EXTRACTION, BILATERAL Bilateral   . FOOT SURGERY  2001 and 2005   bilateral  . HERNIA REPAIR    . WRIST SURGERY Left 08/12/2017    There were no vitals filed for this visit.   Subjective Assessment - 07/22/20 1536    Subjective Pt reports doing 7 min on bike and being super weak and "wobbly" after. Pt reports putting in order for walker at [employer]. Will pick it up Thursday. Pt presents in new high-top shoe for better ankle support.    Pertinent History CMT    Patient Stated Goals Pt wants to increase her muscle tone to try to improve strength and balance.    Currently in Pain? Yes    Pain Score 2     Pain  Location Leg    Pain Orientation Right;Left    Pain Descriptors / Indicators Tightness    Pain Type Chronic pain                             [facility] Adult PT Treatment/Exercise - 07/22/20 1547      Transfers   Transfers Sit to Stand;Stand to Sit    Sit to Stand 5: Supervision      Ambulation/Gait   Ambulation/Gait Yes    Ambulation/Gait Assistance 5: Supervision;4: Min guard    Ambulation/Gait Assistance Details Pt cued on proper step length and proper positioning within RW. Pt with 1 episode of R knee buckling resulting in R LOB. Pt was able to use UE support on RW to stabilize herself.    Ambulation Distance (Feet) 345 Feet    Assistive device Rolling walker    Gait Pattern Step-through pattern;Decreased stance time - right;Decreased step length - right;Decreased step length - left    Ambulation Surface Level;Indoor      Neuro Re-ed    Neuro Re-ed Details  Pt was instructed in rockerboard training including maintaing equilibrium  on balance baord for 1 minute with 2-finger support bil. Pt then instructed in slow controlled rocking forward and back in order to promote increased ankle stability and PF control during gait. Pt then instructed in ball rolls on counter x 5 with each UE for 2 trials while maintaining balance on rockerboard.      Exercises   Exercises Other Exercises    Other Exercises  Pt was instructed in glute bridges with BLE on red med ball x 10 reps with cues to squeeze glutes at top of bridge. Pt then instructed in bil hip flexion with ball squeeze (adduction) and slow lowering back to hooklying position x 10 reps with cues on proper Transverse Abdominus activation throughout. Pt then instructed in bil sidelying HAs in order to promote proximal strengthening for CMT. Pt completes x 10 reps bil with visual target for proper ROM and cueing on proper alignment of hip to prevent ER. Pt then instructed in dead bug exercise for core strengthening. Pt completed  x12 reps with cues for proper rhythm and ROM.                  PT Education - 07/22/20 1708    Education Details Pt educated on modified HEP as well as proper positioning and safet with HEP.    Person(s) Educated Patient    Methods Explanation;Demonstration;Verbal cues;Tactile cues    Comprehension Verbalized understanding            PT Short Term Goals - 07/18/20 0746      PT SHORT TERM GOAL #1   Title Pt will be independent with initial HEP for strengthening/ROM and balance.    Baseline Pt reports doing exercises every other day. Denies any questions on current exercises as of 07/17/20    Time 4    Period Weeks    Status Achieved    Target Date 07/18/20      PT SHORT TERM GOAL #2   Title Berg Balance will be assessed and LTG written to further assess balance.    Baseline performed on 07/02/20 with 31/56    Time 4    Period Weeks    Status Achieved    Target Date 07/18/20      PT SHORT TERM GOAL #3   Title Pt will decrease TUG from 24 sec to <20 sec for improved balance and functional mobility.    Baseline 06/18/20 24 sec; 07/15/20 13 s TUG - goal met.    Time 4    Period Weeks    Status Achieved    Target Date 07/18/20      PT SHORT TERM GOAL #4   Title Pt will ambulate >300' mod I with RW versus cane on level surfaces for improved household mobility and safety.    Baseline 07/15/20 315' supervision with cane    Time 4    Period Weeks    Status Partially Met    Target Date 07/18/20             PT Long Term Goals - 07/02/20 2050      PT LONG TERM GOAL #1   Title Pt will be independent with progressive HEP for strengthening/ROM and balance to continue gains on own.    Time 8    Period Weeks    Status New      PT LONG TERM GOAL #2   Title Pt will be able to perform sit to stand x 5 without hands for improved functional strength  safely.    Baseline 1 rep without hands CGA.    Time 8    Period Weeks    Status New      PT LONG TERM GOAL #3    Title Pt will increase gait speed from 0.75ms to >0.511m for improved safety with household mobility.    Baseline 0.3477mon 06/18/20    Time 8    Period Weeks    Status New      PT LONG TERM GOAL #4   Title Pt will ambulate >500' on outdoor paved surfaces with LRAD mod I for improved short community distances.    Time 8    Period Weeks    Status New      PT LONG TERM GOAL #5   Title Pt will ambulate up/down flight of stairs with cane and 1 railing mod I for improved access to home.    Time 8    Period Weeks    Status New      PT LONG TERM GOAL #6   Title Pt will increase Berg Balance from 31/56 to >36/56 for improved blance and decreased fall risk.    Baseline 07/02/20 31/56    Time 8    Period Weeks    Status New                 Plan - 07/22/20 1709    Clinical Impression Statement Today's skilled PT session focused on proximal strengthening of BLEs, dynamic balance training focused on increased ankle stability and PF control during gait, and gait training with RW. Pt is making good progress towards goals and will use RW more in community for increased stability and safety. Pt to continue to progress towards ST and LT goals.    Personal Factors and Comorbidities Comorbidity 1    Comorbidities depression    Examination-Activity Limitations Stand;Locomotion Level;Stairs;Transfers    Examination-Participation Restrictions Community Activity;Cleaning    Stability/Clinical Decision Making Stable/Uncomplicated    Rehab Potential Good    PT Frequency 2x / week   plus eval   PT Duration 8 weeks    PT Treatment/Interventions ADLs/Self Care Home Management;Cryotherapy;Moist Heat;DME Instruction;Gait training;Stair training;Functional mobility training;Therapeutic activities;Therapeutic exercise;Orthotic Fit/Training;Patient/family education;Neuromuscular re-education;Balance training;Passive range of motion;Vestibular;Manual techniques    PT Next Visit Plan Continue to work on  proximal LE strengthening, continue to progress balance training to SLS and more compliant surfaces.    PT Home Exercise Plan PT added dead bugs and removed seated marches.    Consulted and Agree with Plan of Care Patient           Patient will benefit from skilled therapeutic intervention in order to improve the following deficits and impairments:  Abnormal gait, Decreased activity tolerance, Decreased balance, Decreased endurance, Decreased mobility, Decreased knowledge of use of DME, Decreased range of motion, Decreased strength, Impaired flexibility, Impaired sensation  Visit Diagnosis: Other abnormalities of gait and mobility  Muscle weakness (generalized)  Unsteadiness on feet     Problem List Patient Active Problem List   Diagnosis Date Noted  . Hyperglycemia 07/09/2019  . Constipation 04/29/2015  . Encounter to establish care 04/29/2015  . Peripheral neuropathy 09/07/2012  . Charcot-Marie-Tooth disease 01/31/2012  . Hypothyroid 01/31/2012  . Atrial septal defect 01/31/2012  . Hyperlipidemia 01/31/2012  . GERD (gastroesophageal reflux disease) 01/31/2012  . Osteoarthritis 01/31/2012  . Osteoporosis 01/31/2012  . Depression 01/31/2012  . ADD (attention deficit disorder) 01/31/2012  . Fibromyalgia 01/31/2012  . Allergic rhinitis 01/31/2012  Rosalita Levan, SPT 07/23/2020, 8:13 AM  [facility] [address] [place], [place], [address] Phone: [phone]   Fax:  [phone]  Name: Aiyannah Fayad MRN: [id number] Date of Birth: 09/09/1956

## 2020-07-24 ENCOUNTER — Ambulatory Visit: Payer: Medicare Other | Admitting: Physical Therapy

## 2020-07-29 ENCOUNTER — Other Ambulatory Visit: Payer: Self-pay

## 2020-07-29 ENCOUNTER — Telehealth: Payer: Self-pay | Admitting: Occupational Therapy

## 2020-07-29 ENCOUNTER — Ambulatory Visit: Payer: Medicare Other

## 2020-07-29 DIAGNOSIS — R2689 Other abnormalities of gait and mobility: Secondary | ICD-10-CM | POA: Diagnosis not present

## 2020-07-29 DIAGNOSIS — R2681 Unsteadiness on feet: Secondary | ICD-10-CM

## 2020-07-29 DIAGNOSIS — M6281 Muscle weakness (generalized): Secondary | ICD-10-CM

## 2020-07-29 DIAGNOSIS — G600 Hereditary motor and sensory neuropathy: Secondary | ICD-10-CM

## 2020-07-29 NOTE — Therapy (Signed)
[facility] [address] [place], [place], [address] Phone: [phone]   Fax:  [phone]  Physical Therapy Treatment  Patient Details  Name: Alexandra Norman MRN: [id number] Date of Birth: 1955-12-25 Referring Provider (PT): Carolann Littler   Encounter Date: 07/29/2020   PT End of Session - 07/29/20 1759    Visit Number 8    Number of Visits 17    Date for PT Re-Evaluation 09/16/20    Authorization Type Medicare so 10th visit progress note.    PT Start Time 1530    PT Stop Time 1615    PT Time Calculation (min) 45 min    Equipment Utilized During Treatment Gait belt    Activity Tolerance Patient tolerated treatment well    Behavior During Therapy WFL for tasks assessed/performed           Past Medical History:  Diagnosis Date  . ADD (attention deficit disorder)   . Allergy   . Arrhythmia   . Arthritis   . Charcot-Marie-Tooth disease   . Depression   . Fibromyalgia   . Headache(784.0)   . Heart murmur   . Hyperlipidemia   . Osteoarthritis   . Osteoporosis   . Rotator cuff tear   . Thyroid disease   . Ulcer   . UTI (urinary tract infection)     Past Surgical History:  Procedure Laterality Date  . CARDIAC SURGERY  1982   ASD repair   . CARPAL TUNNEL RELEASE  2007   bilateral  . CATARACT EXTRACTION, BILATERAL Bilateral   . FOOT SURGERY  2001 and 2005   bilateral  . HERNIA REPAIR    . WRIST SURGERY Left 08/12/2017    There were no vitals filed for this visit.   Subjective Assessment - 07/29/20 1611    Subjective Pt reports knee buckling while doing standing hip abduction exercises on LLE while standing in SLS on RLE while holding onto countertop. This has caused pt much distress as she feels as if this was a huge setback to her progress. She expresses how she becomes seasonally depressed and how this is a difficult time for her. She has been very anxious about walking and leaving her home.      Pertinent History CMT    Patient Stated Goals Pt wants to increase her muscle tone to try to improve strength and balance.    Currently in Pain? Yes    Pain Score 7     Pain Location Knee    Pain Orientation Right    Pain Descriptors / Indicators Aching;Tender    Pain Type Acute pain    Pain Onset In the past 7 days                             [facility] Adult PT Treatment/Exercise - 07/29/20 1710      Ambulation/Gait   Ambulation/Gait No    Gait Comments Pt refuses gait training d/t injury and anxiety.      Self-Care   Self-Care Other Self-Care Comments    Other Self-Care Comments  PT assessed right knee: removed pt's right knee brace for medial/lateral stability. Noted slight swelling to right medial knee compared to left. Medial joint line tenderness, Anterior/posterior drawer negative. Discussed that if pt continues to have issues and feeling anxious about knee she could follow-up with her ortho if she felt that would help. Did not note any instability during visit with  testing. Pt educated on proper management of knee injury, HEP modifications (discontinue stnading HAs), and POC.      Neuro Re-ed    Neuro Re-ed Details  Pt was instructed in deep breathing techniques as well as transverse abdominus (TrA) activation with deep breathing in order to strengthen the TrA and provide increased Lumbar support via thoracolubar fascia.  Pt reports reduced anxiety and feeling better after deep breathing exercises x6 reps. Pt was instructed to inhale for 3 seconds, hold for 2-3 seconds, and exhale for 7 seconds.      Exercises   Exercises Other Exercises    Other Exercises  Pt was instructed in alternating seated marches x20. Pt instructed in R SLR x10 in supine and R HA x10 x 10 in sidelying. PT provided tactile and verbal cueing for proper form, pacing, and ROM.                   PT Education - 07/29/20 1758    Education Details Pt educated on deep breathing  techniques, management of knee injury and modification to HEP.    Person(s) Educated Patient    Methods Explanation;Demonstration    Comprehension Verbalized understanding            PT Short Term Goals - 07/18/20 0746      PT SHORT TERM GOAL #1   Title Pt will be independent with initial HEP for strengthening/ROM and balance.    Baseline Pt reports doing exercises every other day. Denies any questions on current exercises as of 07/17/20    Time 4    Period Weeks    Status Achieved    Target Date 07/18/20      PT SHORT TERM GOAL #2   Title Berg Balance will be assessed and LTG written to further assess balance.    Baseline performed on 07/02/20 with 31/56    Time 4    Period Weeks    Status Achieved    Target Date 07/18/20      PT SHORT TERM GOAL #3   Title Pt will decrease TUG from 24 sec to <20 sec for improved balance and functional mobility.    Baseline 06/18/20 24 sec; 07/15/20 13 s TUG - goal met.    Time 4    Period Weeks    Status Achieved    Target Date 07/18/20      PT SHORT TERM GOAL #4   Title Pt will ambulate >300' mod I with RW versus cane on level surfaces for improved household mobility and safety.    Baseline 07/15/20 315' supervision with cane    Time 4    Period Weeks    Status Partially Met    Target Date 07/18/20             PT Long Term Goals - 07/02/20 2050      PT LONG TERM GOAL #1   Title Pt will be independent with progressive HEP for strengthening/ROM and balance to continue gains on own.    Time 8    Period Weeks    Status New      PT LONG TERM GOAL #2   Title Pt will be able to perform sit to stand x 5 without hands for improved functional strength safely.    Baseline 1 rep without hands CGA.    Time 8    Period Weeks    Status New      PT LONG TERM GOAL #3  Title Pt will increase gait speed from 0.27m/s to >0.36m/s for improved safety with household mobility.    Baseline 0.83m/s on 06/18/20    Time 8    Period Weeks     Status New      PT LONG TERM GOAL #4   Title Pt will ambulate >500' on outdoor paved surfaces with LRAD mod I for improved short community distances.    Time 8    Period Weeks    Status New      PT LONG TERM GOAL #5   Title Pt will ambulate up/down flight of stairs with cane and 1 railing mod I for improved access to home.    Time 8    Period Weeks    Status New      PT LONG TERM GOAL #6   Title Pt will increase Berg Balance from 31/56 to >36/56 for improved blance and decreased fall risk.    Baseline 07/02/20 31/56    Time 8    Period Weeks    Status New                 Plan - 07/29/20 1802    Clinical Impression Statement Today's skilled PT session focused on patient education on management of knee injury and modification to HEP and POC. Pt presented emotionally and with high anxiety surrounding incident of knee buckling with subsequent swelling/pain. After discussion with PT, pt decided she wants to go down to 1x/week until she feels more comfortable with walking and physical activity in general. PT focused today's session on seated and supine exercises as well as deep breathing exercises along with Transverse Abdominus activation for core strengthening. PT to continue to progress towards ST and LT goals as pt tolerates and particpates.    Personal Factors and Comorbidities Comorbidity 1    Comorbidities depression    Examination-Activity Limitations Stand;Locomotion Level;Stairs;Transfers    Examination-Participation Restrictions Community Activity;Cleaning    Stability/Clinical Decision Making Stable/Uncomplicated    Rehab Potential Good    PT Frequency 2x / week   plus eval   PT Duration 8 weeks    PT Treatment/Interventions ADLs/Self Care Home Management;Cryotherapy;Moist Heat;DME Instruction;Gait training;Stair training;Functional mobility training;Therapeutic activities;Therapeutic exercise;Orthotic Fit/Training;Patient/family education;Neuromuscular  re-education;Balance training;Passive range of motion;Vestibular;Manual techniques    PT Next Visit Plan Continue to work on proximal LE strengthening, continue to progress balance training to SLS and more compliant surfaces.    PT Home Exercise Plan PT added dead bugs and removed seated marches.    Consulted and Agree with Plan of Care Patient           Patient will benefit from skilled therapeutic intervention in order to improve the following deficits and impairments:  Abnormal gait, Decreased activity tolerance, Decreased balance, Decreased endurance, Decreased mobility, Decreased knowledge of use of DME, Decreased range of motion, Decreased strength, Impaired flexibility, Impaired sensation  Visit Diagnosis: Other abnormalities of gait and mobility  Muscle weakness (generalized)  Unsteadiness on feet     Problem List Patient Active Problem List   Diagnosis Date Noted  . Hyperglycemia 07/09/2019  . Constipation 04/29/2015  . Encounter to establish care 04/29/2015  . Peripheral neuropathy 09/07/2012  . Charcot-Marie-Tooth disease 01/31/2012  . Hypothyroid 01/31/2012  . Atrial septal defect 01/31/2012  . Hyperlipidemia 01/31/2012  . GERD (gastroesophageal reflux disease) 01/31/2012  . Osteoarthritis 01/31/2012  . Osteoporosis 01/31/2012  . Depression 01/31/2012  . ADD (attention deficit disorder) 01/31/2012  . Fibromyalgia 01/31/2012  . Allergic rhinitis 01/31/2012  Rosalita Levan, SPT 07/29/2020, 8:58 PM  [facility] [address] [place], [place], [address] Phone: [phone]   Fax:  [phone]  Name: Julieann Drummonds MRN: [id number] Date of Birth: 1956/08/26

## 2020-07-29 NOTE — Telephone Encounter (Signed)
Dr. Caryl Never,  This patient is scheduled for an OT evaluation Thursday, 10/28. If you feel that occupational therapy is need, please place an OT referral. This patient already has a PT referral.   Thank you for your assistance, Kallie Edward, MOT, OTR/L

## 2020-07-31 ENCOUNTER — Ambulatory Visit: Payer: Medicare Other

## 2020-07-31 ENCOUNTER — Ambulatory Visit: Payer: Medicare Other | Admitting: Occupational Therapy

## 2020-08-04 NOTE — Telephone Encounter (Signed)
I was away last week on vacation.  I placed order for OT today (this should have been placed at the time of her PT order).  Thanks!

## 2020-08-04 NOTE — Addendum Note (Signed)
Addended by: Kristian Covey on: 08/04/2020 08:24 AM   Modules accepted: Orders

## 2020-08-05 ENCOUNTER — Ambulatory Visit: Payer: Medicare Other | Admitting: Physical Therapy

## 2020-08-07 ENCOUNTER — Ambulatory Visit: Payer: Medicare Other

## 2020-08-12 ENCOUNTER — Ambulatory Visit: Payer: Medicare Other | Attending: Family Medicine | Admitting: Physical Therapy

## 2020-08-12 ENCOUNTER — Other Ambulatory Visit: Payer: Self-pay

## 2020-08-12 DIAGNOSIS — G600 Hereditary motor and sensory neuropathy: Secondary | ICD-10-CM

## 2020-08-12 DIAGNOSIS — R2681 Unsteadiness on feet: Secondary | ICD-10-CM | POA: Diagnosis present

## 2020-08-12 DIAGNOSIS — R2689 Other abnormalities of gait and mobility: Secondary | ICD-10-CM

## 2020-08-12 DIAGNOSIS — M6281 Muscle weakness (generalized): Secondary | ICD-10-CM | POA: Diagnosis present

## 2020-08-12 NOTE — Therapy (Signed)
[facility] [address] [place], [place], [address] Phone: [phone]   Fax:  [phone]  Physical Therapy Treatment  Patient Details  Name: Alexandra Norman MRN: [id number] Date of Birth: 1956-02-06 Referring Provider (PT): Carolann Littler   Encounter Date: 08/12/2020   PT End of Session - 08/12/20 1627    Visit Number 9    Number of Visits 17    Date for PT Re-Evaluation 09/16/20    Authorization Type Medicare so 10th visit progress note.    PT Start Time 1535    PT Stop Time 1615    PT Time Calculation (min) 40 min    Equipment Utilized During Treatment Gait belt    Activity Tolerance Patient tolerated treatment well    Behavior During Therapy WFL for tasks assessed/performed           Past Medical History:  Diagnosis Date  . ADD (attention deficit disorder)   . Allergy   . Arrhythmia   . Arthritis   . Charcot-Marie-Tooth disease   . Depression   . Fibromyalgia   . Headache(784.0)   . Heart murmur   . Hyperlipidemia   . Osteoarthritis   . Osteoporosis   . Rotator cuff tear   . Thyroid disease   . Ulcer   . UTI (urinary tract infection)     Past Surgical History:  Procedure Laterality Date  . CARDIAC SURGERY  1982   ASD repair   . CARPAL TUNNEL RELEASE  2007   bilateral  . CATARACT EXTRACTION, BILATERAL Bilateral   . FOOT SURGERY  2001 and 2005   bilateral  . HERNIA REPAIR    . WRIST SURGERY Left 08/12/2017    There were no vitals filed for this visit.   Subjective Assessment - 08/12/20 1546    Subjective Pt states right knee swelling and bruising has decreased; however, it still feels tender. Pt highly anxious and worried about placing weight on R knee due to knee buckling.    Pertinent History CMT    Patient Stated Goals Pt wants to increase her muscle tone to try to improve strength and balance.    Currently in Pain? Yes    Pain Score 4     Pain Location Knee    Pain  Orientation Right    Pain Descriptors / Indicators Aching;Tender    Pain Type Acute pain    Pain Onset In the past 7 days                             [facility] Adult PT Treatment/Exercise - 08/12/20 0001      Ambulation/Gait   Ambulation/Gait Yes    Ambulation/Gait Assistance 5: Supervision;4: Min guard    Ambulation Distance (Feet) 115 Feet    Assistive device Small based quad cane    Gait Pattern Step-through pattern;Decreased stance time - right;Decreased step length - right;Decreased step length - left;Wide base of support;Antalgic    Ambulation Surface Level;Indoor    Gait Comments Amb to/from door and mat table      Lumbar Exercises: Aerobic   Other Aerobic Exercise Sci Fit x10 min UE+LE L2.3      Knee/Hip Exercises: Machines for Strengthening   Cybex Leg Press L LE: 30# x10, 40# x10, 50# x10      Knee/Hip Exercises: Supine   Quad Sets Strengthening;2 sets;10 reps    Straight Leg Raises 10 reps;2 sets  PT Education - 08/12/20 1621    Education Details Reinforced with pt importance of maintaining strength in R knee despite her anxiety on using it and placing weight on it. Discussed modifying R LE exercises in her HEP to decrease her anxiety with standing. Discussed that pt will not improve or maintain her strength if she does not remain challenged.    Person(s) Educated Patient    Methods Explanation;Demonstration    Comprehension Verbalized understanding            PT Short Term Goals - 07/18/20 0746      PT SHORT TERM GOAL #1   Title Pt will be independent with initial HEP for strengthening/ROM and balance.    Baseline Pt reports doing exercises every other day. Denies any questions on current exercises as of 07/17/20    Time 4    Period Weeks    Status Achieved    Target Date 07/18/20      PT SHORT TERM GOAL #2   Title Berg Balance will be assessed and LTG written to further assess balance.    Baseline performed on  07/02/20 with 31/56    Time 4    Period Weeks    Status Achieved    Target Date 07/18/20      PT SHORT TERM GOAL #3   Title Pt will decrease TUG from 24 sec to <20 sec for improved balance and functional mobility.    Baseline 06/18/20 24 sec; 07/15/20 13 s TUG - goal met.    Time 4    Period Weeks    Status Achieved    Target Date 07/18/20      PT SHORT TERM GOAL #4   Title Pt will ambulate >300' mod I with RW versus cane on level surfaces for improved household mobility and safety.    Baseline 07/15/20 315' supervision with cane    Time 4    Period Weeks    Status Partially Met    Target Date 07/18/20             PT Long Term Goals - 07/02/20 2050      PT LONG TERM GOAL #1   Title Pt will be independent with progressive HEP for strengthening/ROM and balance to continue gains on own.    Time 8    Period Weeks    Status New      PT LONG TERM GOAL #2   Title Pt will be able to perform sit to stand x 5 without hands for improved functional strength safely.    Baseline 1 rep without hands CGA.    Time 8    Period Weeks    Status New      PT LONG TERM GOAL #3   Title Pt will increase gait speed from 0.73ms to >0.519m for improved safety with household mobility.    Baseline 0.3433mon 06/18/20    Time 8    Period Weeks    Status New      PT LONG TERM GOAL #4   Title Pt will ambulate >500' on outdoor paved surfaces with LRAD mod I for improved short community distances.    Time 8    Period Weeks    Status New      PT LONG TERM GOAL #5   Title Pt will ambulate up/down flight of stairs with cane and 1 railing mod I for improved access to home.    Time 8    Period Weeks  Status New      PT LONG TERM GOAL #6   Title Pt will increase Berg Balance from 31/56 to >36/56 for improved blance and decreased fall risk.    Baseline 07/02/20 31/56    Time 8    Period Weeks    Status New                 Plan - 08/12/20 1622    Clinical Impression Statement Pt  remains anxious about her R knee buckling (no other instances noted since previous session). Modified her HEP to include quad strengthening in supine position vs standing positioning including quad sets and revisiting SLRs. Treatment session focused on endurance using sci fit and LE strengthening. Pt able to tolerate 10 minutes on sci-fit. Most of treatment focused on discussing self care to decrease pt's anxiety and fear of falls. Pt needing increased encouragement to increase her confidence with weight bearing and standing exercises.    Personal Factors and Comorbidities Comorbidity 1    Comorbidities depression    Examination-Activity Limitations Stand;Locomotion Level;Stairs;Transfers    Examination-Participation Restrictions Community Activity;Cleaning    Stability/Clinical Decision Making Stable/Uncomplicated    Rehab Potential Good    PT Frequency 2x / week   plus eval   PT Duration 8 weeks    PT Treatment/Interventions ADLs/Self Care Home Management;Cryotherapy;Moist Heat;DME Instruction;Gait training;Stair training;Functional mobility training;Therapeutic activities;Therapeutic exercise;Orthotic Fit/Training;Patient/family education;Neuromuscular re-education;Balance training;Passive range of motion;Vestibular;Manual techniques    PT Next Visit Plan Continue to work on proximal LE strengthening, continue to progress balance training to SLS and more compliant surfaces. Continue progressing endurance as able.    PT Home Exercise Plan Discussed Quad sets and SLR.    Consulted and Agree with Plan of Care Patient           Patient will benefit from skilled therapeutic intervention in order to improve the following deficits and impairments:  Abnormal gait, Decreased activity tolerance, Decreased balance, Decreased endurance, Decreased mobility, Decreased knowledge of use of DME, Decreased range of motion, Decreased strength, Impaired flexibility, Impaired sensation  Visit Diagnosis: Charcot  Marie Tooth muscular atrophy  Other abnormalities of gait and mobility  Muscle weakness (generalized)  Unsteadiness on feet  Charcot-Marie disease     Problem List Patient Active Problem List   Diagnosis Date Noted  . Hyperglycemia 07/09/2019  . Constipation 04/29/2015  . Encounter to establish care 04/29/2015  . Peripheral neuropathy 09/07/2012  . Charcot-Marie-Tooth disease 01/31/2012  . Hypothyroid 01/31/2012  . Atrial septal defect 01/31/2012  . Hyperlipidemia 01/31/2012  . GERD (gastroesophageal reflux disease) 01/31/2012  . Osteoarthritis 01/31/2012  . Osteoporosis 01/31/2012  . Depression 01/31/2012  . ADD (attention deficit disorder) 01/31/2012  . Fibromyalgia 01/31/2012  . Allergic rhinitis 01/31/2012    Abdallah Hern April Ma L Dane Kopke PT, DPT 08/12/2020, 4:30 PM  [facility] [address] [place], [place], [address] Phone: [phone]   Fax:  [phone]  Name: Alexandra Norman MRN: [id number] Date of Birth: April 29, 1956

## 2020-08-14 ENCOUNTER — Ambulatory Visit: Payer: Medicare Other

## 2020-08-19 ENCOUNTER — Ambulatory Visit: Payer: Medicare Other | Admitting: Physical Therapy

## 2020-08-21 ENCOUNTER — Ambulatory Visit: Payer: Medicare Other

## 2020-09-02 ENCOUNTER — Ambulatory Visit: Payer: Medicare Other | Admitting: Physical Therapy

## 2020-09-09 ENCOUNTER — Ambulatory Visit: Payer: Medicare Other | Admitting: Physical Therapy

## 2020-09-11 ENCOUNTER — Other Ambulatory Visit: Payer: Self-pay

## 2020-09-11 ENCOUNTER — Ambulatory Visit: Payer: Medicare Other | Attending: Family Medicine

## 2020-09-11 DIAGNOSIS — M6281 Muscle weakness (generalized): Secondary | ICD-10-CM | POA: Insufficient documentation

## 2020-09-11 DIAGNOSIS — G600 Hereditary motor and sensory neuropathy: Secondary | ICD-10-CM | POA: Insufficient documentation

## 2020-09-11 DIAGNOSIS — R2681 Unsteadiness on feet: Secondary | ICD-10-CM | POA: Insufficient documentation

## 2020-09-11 DIAGNOSIS — R2689 Other abnormalities of gait and mobility: Secondary | ICD-10-CM | POA: Insufficient documentation

## 2020-09-11 NOTE — Therapy (Signed)
[facility] [address] [place], [place], [address] Phone: [phone]   Fax:  [phone]  Physical Therapy Treatment/ 10th visit progress note  Patient Details  Name: Alexandra Norman MRN: [id number] Date of Birth: 12-11-1955 Referring Provider (PT): Carolann Littler    Progress Note  Reporting period 06/18/20 to 09/11/20  See Note below for Objective Data and Assessment of Progress/Goals   Encounter Date: 09/11/2020   PT End of Session - 09/11/20 1018    Visit Number 10    Number of Visits 17    Date for PT Re-Evaluation 09/16/20    Authorization Type Medicare so 10th visit progress note.    PT Start Time 1017    PT Stop Time 1059    PT Time Calculation (min) 42 min    Equipment Utilized During Treatment Gait belt    Activity Tolerance Patient tolerated treatment well    Behavior During Therapy WFL for tasks assessed/performed           Past Medical History:  Diagnosis Date  . ADD (attention deficit disorder)   . Allergy   . Arrhythmia   . Arthritis   . Charcot-Marie-Tooth disease   . Depression   . Fibromyalgia   . Headache(784.0)   . Heart murmur   . Hyperlipidemia   . Osteoarthritis   . Osteoporosis   . Rotator cuff tear   . Thyroid disease   . Ulcer   . UTI (urinary tract infection)     Past Surgical History:  Procedure Laterality Date  . CARDIAC SURGERY  1982   ASD repair   . CARPAL TUNNEL RELEASE  2007   bilateral  . CATARACT EXTRACTION, BILATERAL Bilateral   . FOOT SURGERY  2001 and 2005   bilateral  . HERNIA REPAIR    . WRIST SURGERY Left 08/12/2017    There were no vitals filed for this visit.   Subjective Assessment - 09/11/20 1019    Subjective Pt reports that she is doing so much better. Her confidence is much improved. She feels exercises are really helping. She went to [employer] the other day and walked around for an hour. Walking much smoother with cane with quad tip.     Pertinent History CMT    Patient Stated Goals Pt wants to increase her muscle tone to try to improve strength and balance.    Currently in Pain? No/denies    Pain Onset In the past 7 days                             [facility] Adult PT Treatment/Exercise - 09/11/20 1023      Transfers   Transfers Sit to Stand;Stand to Sit    Sit to Stand 6: Modified independent (Device/Increase time)    Stand to Sit 6: Modified independent (Device/Increase time)      Ambulation/Gait   Ambulation/Gait Yes    Ambulation/Gait Assistance 6: Modified independent (Device/Increase time);5: Supervision    Ambulation/Gait Assistance Details Pt reported starting to get some fatigue right towards end. Pt does have some increased lateral sway.    Ambulation Distance (Feet) 345 Feet    Assistive device Straight cane   with quad tip   Gait Pattern Step-through pattern;Decreased step length - right;Decreased step length - left;Decreased stance time - right    Ambulation Surface Level;Indoor    Gait velocity 14.04 sec=0.96ms      Standardized Balance Assessment  Standardized Balance Assessment Berg Balance Test      Berg Balance Test   Sit to Stand Able to stand without using hands and stabilize independently    Standing Unsupported Able to stand 2 minutes with supervision    Sitting with Back Unsupported but Feet Supported on Floor or Stool Able to sit safely and securely 2 minutes    Stand to Sit Sits safely with minimal use of hands    Transfers Able to transfer safely, minor use of hands    Standing Unsupported with Eyes Closed Needs help to keep from falling    Standing Ubsupported with Feet Together Able to place feet together independently and stand for 1 minute with supervision    From Standing, Reach Forward with Outstretched Arm Can reach confidently >25 cm (10")    From Standing Position, Pick up Object from Floor Able to pick up shoe, needs supervision    From Standing Position, Turn  to Look Behind Over each Shoulder Turn sideways only but maintains balance    Turn 360 Degrees Able to turn 360 degrees safely but slowly    Standing Unsupported, Alternately Place Feet on Step/Stool Able to complete >2 steps/needs minimal assist    Standing Unsupported, One Foot in Front Needs help to step but can hold 15 seconds    Standing on One Leg Tries to lift leg/unable to hold 3 seconds but remains standing independently    Total Score 36      Exercises   Exercises Other Exercises      Lumbar Exercises: Aerobic   Other Aerobic Exercise Sci Fit x 6 min level 4 BLE and BUE for strengthening and endurance. Pt reported feeling pretty good after.                  PT Education - 09/11/20 1344    Education Details Educated on results of gait speed and Berg testing. Discussed recert due next visit.    Person(s) Educated Patient    Methods Explanation    Comprehension Verbalized understanding            PT Short Term Goals - 07/18/20 0746      PT SHORT TERM GOAL #1   Title Pt will be independent with initial HEP for strengthening/ROM and balance.    Baseline Pt reports doing exercises every other day. Denies any questions on current exercises as of 07/17/20    Time 4    Period Weeks    Status Achieved    Target Date 07/18/20      PT SHORT TERM GOAL #2   Title Berg Balance will be assessed and LTG written to further assess balance.    Baseline performed on 07/02/20 with 31/56    Time 4    Period Weeks    Status Achieved    Target Date 07/18/20      PT SHORT TERM GOAL #3   Title Pt will decrease TUG from 24 sec to <20 sec for improved balance and functional mobility.    Baseline 06/18/20 24 sec; 07/15/20 13 s TUG - goal met.    Time 4    Period Weeks    Status Achieved    Target Date 07/18/20      PT SHORT TERM GOAL #4   Title Pt will ambulate >300' mod I with RW versus cane on level surfaces for improved household mobility and safety.    Baseline 07/15/20  315' supervision with cane    Time  4    Period Weeks    Status Partially Met    Target Date 07/18/20             PT Long Term Goals - 09/11/20 1034      PT LONG TERM GOAL #1   Title Pt will be independent with progressive HEP for strengthening/ROM and balance to continue gains on own.    Time 8    Period Weeks    Status New      PT LONG TERM GOAL #2   Title Pt will be able to perform sit to stand x 5 without hands for improved functional strength safely.    Baseline 1 rep without hands CGA.    Time 8    Period Weeks    Status New      PT LONG TERM GOAL #3   Title Pt will increase gait speed from 0.36ms to >0.528m for improved safety with household mobility.    Baseline 0.3452mon 06/18/20. 09/11/20 0.66m62m  Time 8    Period Weeks    Status Achieved      PT LONG TERM GOAL #4   Title Pt will ambulate >500' on outdoor paved surfaces with LRAD mod I for improved short community distances.    Time 8    Period Weeks    Status New      PT LONG TERM GOAL #5   Title Pt will ambulate up/down flight of stairs with cane and 1 railing mod I for improved access to home.    Time 8    Period Weeks    Status New      PT LONG TERM GOAL #6   Title Pt will increase Berg Balance from 31/56 to >36/56 for improved blance and decreased fall risk.    Baseline 07/02/20 31/56, 09/11/20 36/56    Time 8    Period Weeks    Status Partially Met                 Plan - 09/11/20 1345    Clinical Impression Statement PT started to check LTGs today. Pt is showing much improvement since last visit with gait with less antalgia and improved gait speed to 0.66m/70meting gait speed goal. She increased her Berg score to 36/56 just short of goal. Still high fall risk. Pt was in good spirits today and reports exercises really seem to be helping. Continues to benefit from skilled PT.    Personal Factors and Comorbidities Comorbidity 1    Comorbidities depression    Examination-Activity  Limitations Stand;Locomotion Level;Stairs;Transfers    Examination-Participation Restrictions Community Activity;Cleaning    Stability/Clinical Decision Making Stable/Uncomplicated    Rehab Potential Good    PT Frequency 2x / week   plus eval   PT Duration 8 weeks    PT Treatment/Interventions ADLs/Self Care Home Management;Cryotherapy;Moist Heat;DME Instruction;Gait training;Stair training;Functional mobility training;Therapeutic activities;Therapeutic exercise;Orthotic Fit/Training;Patient/family education;Neuromuscular re-education;Balance training;Passive range of motion;Vestibular;Manual techniques    PT Next Visit Plan Check remaining LTGs for recert next visit. Be sure to review HEP and answer pt's questions. Continue to work on proximal LE strengthening, continue to progress balance training to SLS and more compliant surfaces. Continue progressing endurance as able.    PT Home Exercise Plan Discussed Quad sets and SLR.    Consulted and Agree with Plan of Care Patient           Patient will benefit from skilled therapeutic intervention in order to improve the following  deficits and impairments:  Abnormal gait,Decreased activity tolerance,Decreased balance,Decreased endurance,Decreased mobility,Decreased knowledge of use of DME,Decreased range of motion,Decreased strength,Impaired flexibility,Impaired sensation  Visit Diagnosis: Other abnormalities of gait and mobility  Muscle weakness (generalized)  Unsteadiness on feet     Problem List Patient Active Problem List   Diagnosis Date Noted  . Hyperglycemia 07/09/2019  . Constipation 04/29/2015  . Encounter to establish care 04/29/2015  . Peripheral neuropathy 09/07/2012  . Charcot-Marie-Tooth disease 01/31/2012  . Hypothyroid 01/31/2012  . Atrial septal defect 01/31/2012  . Hyperlipidemia 01/31/2012  . GERD (gastroesophageal reflux disease) 01/31/2012  . Osteoarthritis 01/31/2012  . Osteoporosis 01/31/2012  . Depression  01/31/2012  . ADD (attention deficit disorder) 01/31/2012  . Fibromyalgia 01/31/2012  . Allergic rhinitis 01/31/2012    Electa Sniff, PT, DPT, NCS 09/11/2020, 1:49 PM  [facility] [address] [place], [place], [address] Phone: [phone]   Fax:  [phone]  Name: Alexandra Norman MRN: [id number] Date of Birth: 1956-05-20

## 2020-09-16 ENCOUNTER — Other Ambulatory Visit: Payer: Self-pay

## 2020-09-16 ENCOUNTER — Encounter: Payer: Self-pay | Admitting: Physical Therapy

## 2020-09-16 ENCOUNTER — Ambulatory Visit: Payer: Medicare Other | Admitting: Physical Therapy

## 2020-09-16 DIAGNOSIS — R2689 Other abnormalities of gait and mobility: Secondary | ICD-10-CM | POA: Diagnosis not present

## 2020-09-16 DIAGNOSIS — R2681 Unsteadiness on feet: Secondary | ICD-10-CM

## 2020-09-16 DIAGNOSIS — G600 Hereditary motor and sensory neuropathy: Secondary | ICD-10-CM

## 2020-09-16 DIAGNOSIS — M6281 Muscle weakness (generalized): Secondary | ICD-10-CM

## 2020-09-16 NOTE — Patient Instructions (Signed)
Feet together on foam EC x30 sec Feet together on foam head rotation x10 Feet together on foam head nods x10

## 2020-09-16 NOTE — Therapy (Signed)
[facility] [address] [place], [place], [address] Phone: [phone]   Fax:  [phone]  Physical Therapy Treatment and Re-Certification  Patient Details  Name: Alexandra Norman MRN: [id number] Date of Birth: 1955/11/30 Referring Provider (PT): Carolann Littler   Encounter Date: 09/16/2020   PT End of Session - 09/16/20 1153    Visit Number 11    Number of Visits 21    Date for PT Re-Evaluation 10/14/20    Authorization Type Medicare so 10th visit progress note.    PT Start Time 1112    PT Stop Time 1150    PT Time Calculation (min) 38 min    Equipment Utilized During Treatment Gait belt    Activity Tolerance Patient tolerated treatment well    Behavior During Therapy WFL for tasks assessed/performed           Past Medical History:  Diagnosis Date  . ADD (attention deficit disorder)   . Allergy   . Arrhythmia   . Arthritis   . Charcot-Marie-Tooth disease   . Depression   . Fibromyalgia   . Headache(784.0)   . Heart murmur   . Hyperlipidemia   . Osteoarthritis   . Osteoporosis   . Rotator cuff tear   . Thyroid disease   . Ulcer   . UTI (urinary tract infection)     Past Surgical History:  Procedure Laterality Date  . CARDIAC SURGERY  1982   ASD repair   . CARPAL TUNNEL RELEASE  2007   bilateral  . CATARACT EXTRACTION, BILATERAL Bilateral   . FOOT SURGERY  2001 and 2005   bilateral  . HERNIA REPAIR    . WRIST SURGERY Left 08/12/2017    There were no vitals filed for this visit.   Subjective Assessment - 09/16/20 1119    Subjective Pt states she is doing so much better; however, she has had a tough week helping with her neighbor. Pt states she is tired. Pt states she has been getting out of the house much more.    Pertinent History CMT    Patient Stated Goals Pt wants to increase her muscle tone to try to improve strength and balance.    Currently in Pain? No/denies    Pain Onset In the  past 7 days              [facility] PT Assessment - 09/16/20 0001      Transfers   Five time sit to stand comments  12.18 sec without UE support      Ambulation/Gait   Ambulation Distance (Feet) 556 Feet    Assistive device Small based quad cane    Gait Pattern Step-through pattern;Decreased step length - right;Decreased step length - left;Decreased stance time - right    Ambulation Surface Level;Indoor   pt refused outdoor amb due to weather                             Balance Exercises - 09/16/20 0001      Balance Exercises: Standing   Standing Eyes Opened Head turns;Narrow base of support (BOS);Foam/compliant surface   head turns x10, head nods x10   Standing Eyes Closed Narrow base of support (BOS);Foam/compliant surface;30 secs   standing on 1 pillow            PT Education - 09/16/20 1432    Education Details Discussed her progress with LTGs and recert.  Person(s) Educated Patient    Methods Explanation    Comprehension Verbalized understanding            PT Short Term Goals - 07/18/20 0746      PT SHORT TERM GOAL #1   Title Pt will be independent with initial HEP for strengthening/ROM and balance.    Baseline Pt reports doing exercises every other day. Denies any questions on current exercises as of 07/17/20    Time 4    Period Weeks    Status Achieved    Target Date 07/18/20      PT SHORT TERM GOAL #2   Title Berg Balance will be assessed and LTG written to further assess balance.    Baseline performed on 07/02/20 with 31/56    Time 4    Period Weeks    Status Achieved    Target Date 07/18/20      PT SHORT TERM GOAL #3   Title Pt will decrease TUG from 24 sec to <20 sec for improved balance and functional mobility.    Baseline 06/18/20 24 sec; 07/15/20 13 s TUG - goal met.    Time 4    Period Weeks    Status Achieved    Target Date 07/18/20      PT SHORT TERM GOAL #4   Title Pt will ambulate >300' mod I with RW versus cane on  level surfaces for improved household mobility and safety.    Baseline 07/15/20 315' supervision with cane    Time 4    Period Weeks    Status Partially Met    Target Date 07/18/20             PT Long Term Goals - 09/16/20 1128      PT LONG TERM GOAL #1   Title Pt will be independent with progressive HEP for strengthening/ROM and balance to continue gains on own.    Time 8    Period Weeks    Status Partially Met      PT LONG TERM GOAL #2   Title Pt will be able to perform sit to stand x 5 without hands for improved functional strength safely.    Baseline 1 rep without hands CGA.; performed 5 reps without hands (09/16/20)    Time 8    Period Weeks    Status Achieved      PT LONG TERM GOAL #3   Title Pt will increase gait speed from 0.54ms to >0.522m for improved safety with household mobility.    Baseline 0.3425mon 06/18/20. 09/11/20 0.39m54m  Time 8    Period Weeks    Status Achieved      PT LONG TERM GOAL #4   Title Pt will ambulate >500' on outdoor paved surfaces with LRAD mod I for improved short community distances.    Baseline 556' indoors with small based quad cane within 4 min -- refused attempting outdoors due to weather (09/16/20)    Time 4    Period Weeks    Status Revised    Target Date 10/14/20      PT LONG TERM GOAL #5   Title Pt will ambulate up/down flight of stairs with cane and 1 railing mod I for improved access to home.    Baseline Ascended/descend 16 steps with railing on R, SPC on L mod I (09/16/20)    Time 8    Period Weeks    Status Achieved      Additional  Long Term Goals   Additional Long Term Goals Yes      PT LONG TERM GOAL #6   Title Pt will increase Berg Balance from 31/56 to >36/56 for improved blance and decreased fall risk. (target date: 10/14/2020)    Baseline 07/02/20 31/56, 09/11/20 36/56    Time 4    Period Weeks    Status Revised    Target Date 10/14/20      PT LONG TERM GOAL #7   Title Pt will be able to tolerate 6  minutes of ambulating without seated rest break    Baseline tolerated ~4.5 minutes 09/16/20    Time 4    Period Weeks    Status New    Target Date 10/14/20                 Plan - 09/16/20 1232    Clinical Impression Statement PT finished checking the rest of pt's LTGs. Pt has partially met LTG #1 and #6. She has fully met LTG #2, #3, #4, and #5. She states she has been doing her exercises at home although inconsistently. Pt continues to demo most deficits with her balance and endurance. Initiated a balance HEP today. Pt would benefit from a few more weeks of PT to improve her balance and endurance as much as able for decreased fall risk at home and in the community.    Personal Factors and Comorbidities Comorbidity 1    Comorbidities depression    Examination-Activity Limitations Stand;Locomotion Level;Stairs;Transfers    Examination-Participation Restrictions Community Activity;Cleaning    Stability/Clinical Decision Making Stable/Uncomplicated    Rehab Potential Good    PT Frequency 2x / week   plus eval   PT Duration 8 weeks    PT Treatment/Interventions ADLs/Self Care Home Management;Cryotherapy;Moist Heat;DME Instruction;Gait training;Stair training;Functional mobility training;Therapeutic activities;Therapeutic exercise;Orthotic Fit/Training;Patient/family education;Neuromuscular re-education;Balance training;Passive range of motion;Vestibular;Manual techniques    PT Next Visit Plan Review HEP and answer pt's questions. Continue to work on proximal LE strengthening, continue to progress balance training to SLS and more compliant surfaces. Continue progressing endurance as able.    PT Home Exercise Plan Access Code: [email or web address]    Consulted and Agree with Plan of Care Patient           Patient will benefit from skilled therapeutic intervention in order to improve the following deficits and impairments:  Abnormal gait,Decreased activity tolerance,Decreased balance,Decreased  endurance,Decreased mobility,Decreased knowledge of use of DME,Decreased range of motion,Decreased strength,Impaired flexibility,Impaired sensation  Visit Diagnosis: Other abnormalities of gait and mobility  Muscle weakness (generalized)  Unsteadiness on feet  Charcot Marie Tooth muscular atrophy  Charcot-Marie disease     Problem List Patient Active Problem List   Diagnosis Date Noted  . Hyperglycemia 07/09/2019  . Constipation 04/29/2015  . Encounter to establish care 04/29/2015  . Peripheral neuropathy 09/07/2012  . Charcot-Marie-Tooth disease 01/31/2012  . Hypothyroid 01/31/2012  . Atrial septal defect 01/31/2012  . Hyperlipidemia 01/31/2012  . GERD (gastroesophageal reflux disease) 01/31/2012  . Osteoarthritis 01/31/2012  . Osteoporosis 01/31/2012  . Depression 01/31/2012  . ADD (attention deficit disorder) 01/31/2012  . Fibromyalgia 01/31/2012  . Allergic rhinitis 01/31/2012    Rea Reser April Ma L Kellyanne Ellwanger PT, DPT 09/16/2020, 4:05 PM  [facility] [address] [place], [place], [address] Phone: [phone]   Fax:  [phone]  Name: Kashari Chalmers MRN: [id number] Date of Birth: [birth year]

## 2020-09-22 ENCOUNTER — Other Ambulatory Visit: Payer: Self-pay | Admitting: Family Medicine

## 2020-09-23 ENCOUNTER — Other Ambulatory Visit: Payer: Self-pay

## 2020-09-23 ENCOUNTER — Ambulatory Visit: Payer: Medicare Other

## 2020-09-23 DIAGNOSIS — R2681 Unsteadiness on feet: Secondary | ICD-10-CM

## 2020-09-23 DIAGNOSIS — M6281 Muscle weakness (generalized): Secondary | ICD-10-CM

## 2020-09-23 DIAGNOSIS — R2689 Other abnormalities of gait and mobility: Secondary | ICD-10-CM

## 2020-09-23 NOTE — Therapy (Signed)
[facility] [address] [place], [place], [address] Phone: [phone]   Fax:  [phone]  Physical Therapy Treatment  Patient Details  Name: Alexandra Norman MRN: [id number] Date of Birth: January 02, 1956 Referring Provider (PT): Carolann Littler   Encounter Date: 09/23/2020   PT End of Session - 09/23/20 1103    Visit Number 12    Number of Visits 21    Date for PT Re-Evaluation 10/14/20    Authorization Type Medicare so 10th visit progress note. Re-cert performed 26/83/41    PT Start Time 1101    PT Stop Time 1141    PT Time Calculation (min) 40 min    Equipment Utilized During Treatment Gait belt    Activity Tolerance Patient tolerated treatment well    Behavior During Therapy WFL for tasks assessed/performed           Past Medical History:  Diagnosis Date  . ADD (attention deficit disorder)   . Allergy   . Arrhythmia   . Arthritis   . Charcot-Marie-Tooth disease   . Depression   . Fibromyalgia   . Headache(784.0)   . Heart murmur   . Hyperlipidemia   . Osteoarthritis   . Osteoporosis   . Rotator cuff tear   . Thyroid disease   . Ulcer   . UTI (urinary tract infection)     Past Surgical History:  Procedure Laterality Date  . CARDIAC SURGERY  1982   ASD repair   . CARPAL TUNNEL RELEASE  2007   bilateral  . CATARACT EXTRACTION, BILATERAL Bilateral   . FOOT SURGERY  2001 and 2005   bilateral  . HERNIA REPAIR    . WRIST SURGERY Left 08/12/2017    There were no vitals filed for this visit.   Subjective Assessment - 09/23/20 1103    Subjective Pt reports that she is tired as has been helping a friend out early in the mornings. She has tried the exercises at home but doesn't have a good pillow to use to stand on.    Pertinent History CMT    Patient Stated Goals Pt wants to increase her muscle tone to try to improve strength and balance.    Currently in Pain? No/denies    Pain Onset In the past  7 days                             [facility] Adult PT Treatment/Exercise - 09/23/20 1106      Ambulation/Gait   Ambulation/Gait Yes    Ambulation/Gait Assistance 6: Modified independent (Device/Increase time);5: Supervision    Ambulation/Gait Assistance Details Pt reported getting tired and needing to rest after. Pt had improved cadence with reciprocal pattern with gait and less lateral sway.    Ambulation Distance (Feet) 345 Feet    Assistive device Small based quad cane    Gait Pattern Step-through pattern;Decreased step length - right;Decreased step length - left    Ambulation Surface Level;Indoor      Neuro Re-ed    Neuro Re-ed Details  Sit to stand x 10 without UE support from mat then x 5 from mat with blue mat under feet without UE support getting balance each time holding 5 sec close SBA. Corner balance exercises: standing on pillow with feet together x 30 sec eyes open, x 30 sec eyes closed, head turns left/right and up/down x 10. Pt needed CGA /min assist as had decreased stability on  pillow and touched wall at times. Switched to standing on floor with eyes closed x 30 sec and head turns left/right x 10 with less need for assistance. PT advised to just perform HEP on ground for now and would work up to pillow for safety.      Exercises   Exercises Other Exercises      Lumbar Exercises: Aerobic   Nustep Performed with BLE and BUE level 6 x 6 min for strengthening and improving activity tolerance. Pt reports being winded after and needing to rest. HR=92 after                  PT Education - 09/23/20 1848    Education Details Discussed performing balance exercises issued last session on ground for now in corner versus pillow for improved safety.    Person(s) Educated Patient    Methods Explanation;Demonstration    Comprehension Verbalized understanding            PT Short Term Goals - 07/18/20 0746      PT SHORT TERM GOAL #1   Title Pt will be  independent with initial HEP for strengthening/ROM and balance.    Baseline Pt reports doing exercises every other day. Denies any questions on current exercises as of 07/17/20    Time 4    Period Weeks    Status Achieved    Target Date 07/18/20      PT SHORT TERM GOAL #2   Title Berg Balance will be assessed and LTG written to further assess balance.    Baseline performed on 07/02/20 with 31/56    Time 4    Period Weeks    Status Achieved    Target Date 07/18/20      PT SHORT TERM GOAL #3   Title Pt will decrease TUG from 24 sec to <20 sec for improved balance and functional mobility.    Baseline 06/18/20 24 sec; 07/15/20 13 s TUG - goal met.    Time 4    Period Weeks    Status Achieved    Target Date 07/18/20      PT SHORT TERM GOAL #4   Title Pt will ambulate >300' mod I with RW versus cane on level surfaces for improved household mobility and safety.    Baseline 07/15/20 315' supervision with cane    Time 4    Period Weeks    Status Partially Met    Target Date 07/18/20             PT Long Term Goals - 09/23/20 1113      PT LONG TERM GOAL #1   Title Pt will be independent with progressive HEP for strengthening/ROM and balance to continue gains on own.    Time 8    Period Weeks    Status Partially Met    Target Date 10/14/20      PT LONG TERM GOAL #2   Title Pt will be able to perform sit to stand x 5 without hands for improved functional strength safely.    Baseline 1 rep without hands CGA.; performed 5 reps without hands (09/16/20)    Time 8    Period Weeks    Status Achieved      PT LONG TERM GOAL #3   Title Pt will increase gait speed from 0.27ms to >0.522m for improved safety with household mobility.    Baseline 0.3461mon 06/18/20. 09/11/20 0.33m59m  Time 8    Period  Weeks    Status Achieved      PT LONG TERM GOAL #4   Title Pt will ambulate >500' on outdoor paved surfaces with LRAD mod I for improved short community distances.    Baseline 556'  indoors with small based quad cane within 4 min -- refused attempting outdoors due to weather (09/16/20)    Time 4    Period Weeks    Status Revised      PT LONG TERM GOAL #5   Title Pt will ambulate up/down flight of stairs with cane and 1 railing mod I for improved access to home.    Baseline Ascended/descend 16 steps with railing on R, SPC on L mod I (09/16/20)    Time 8    Period Weeks    Status Achieved      PT LONG TERM GOAL #6   Title Pt will increase Berg Balance from 31/56 to >36/56 for improved blance and decreased fall risk. (target date: 10/14/2020)    Baseline 07/02/20 31/56, 09/11/20 36/56    Time 4    Period Weeks    Status Revised      PT LONG TERM GOAL #7   Title Pt will be able to tolerate 6 minutes of ambulating without seated rest break    Baseline tolerated ~4.5 minutes 09/16/20    Time 4    Period Weeks    Status New                 Plan - 09/23/20 1849    Clinical Impression Statement PT continued to focus on increasing activity tolerance and balance. Revised new balance exercises to standing on floor in corner due to being too unstable on pillow at this time.    Personal Factors and Comorbidities Comorbidity 1    Comorbidities depression    Examination-Activity Limitations Stand;Locomotion Level;Stairs;Transfers    Examination-Participation Restrictions Community Activity;Cleaning    Stability/Clinical Decision Making Stable/Uncomplicated    Rehab Potential Good    PT Frequency 2x / week    PT Duration 4 weeks    PT Treatment/Interventions ADLs/Self Care Home Management;Cryotherapy;Moist Heat;DME Instruction;Gait training;Stair training;Functional mobility training;Therapeutic activities;Therapeutic exercise;Orthotic Fit/Training;Patient/family education;Neuromuscular re-education;Balance training;Passive range of motion;Vestibular;Manual techniques    PT Next Visit Plan Review HEP and answer pt's questions. Continue to work on proximal LE  strengthening, continue to progress balance training to SLS and more compliant surfaces. Continue progressing endurance as able.    PT Home Exercise Plan Access Code: [email or web address]    [email or web address] and Agree with Plan of Care Patient           Patient will benefit from skilled therapeutic intervention in order to improve the following deficits and impairments:  Abnormal gait,Decreased activity tolerance,Decreased balance,Decreased endurance,Decreased mobility,Decreased knowledge of use of DME,Decreased range of motion,Decreased strength,Impaired flexibility,Impaired sensation  Visit Diagnosis: Other abnormalities of gait and mobility  Muscle weakness (generalized)  Unsteadiness on feet     Problem List Patient Active Problem List   Diagnosis Date Noted  . Hyperglycemia 07/09/2019  . Constipation 04/29/2015  . Encounter to establish care 04/29/2015  . Peripheral neuropathy 09/07/2012  . Charcot-Marie-Tooth disease 01/31/2012  . Hypothyroid 01/31/2012  . Atrial septal defect 01/31/2012  . Hyperlipidemia 01/31/2012  . GERD (gastroesophageal reflux disease) 01/31/2012  . Osteoarthritis 01/31/2012  . Osteoporosis 01/31/2012  . Depression 01/31/2012  . ADD (attention deficit disorder) 01/31/2012  . Fibromyalgia 01/31/2012  . Allergic rhinitis 01/31/2012    Electa Sniff, PT, DPT, NCS 09/23/2020, 6:55  PM  [facility] [address] [place], [place], [address] Phone: [phone]   Fax:  [phone]  Name: Sumiko Ceasar MRN: [id number] Date of Birth: [birth year]

## 2020-09-24 ENCOUNTER — Other Ambulatory Visit: Payer: Self-pay | Admitting: Family Medicine

## 2020-09-30 ENCOUNTER — Ambulatory Visit: Payer: Medicare Other | Admitting: Physical Therapy

## 2020-10-01 ENCOUNTER — Ambulatory Visit: Payer: Medicare Other

## 2020-10-07 ENCOUNTER — Other Ambulatory Visit: Payer: Self-pay

## 2020-10-07 ENCOUNTER — Ambulatory Visit: Payer: Medicare Other | Attending: Family Medicine

## 2020-10-07 DIAGNOSIS — M6281 Muscle weakness (generalized): Secondary | ICD-10-CM | POA: Diagnosis present

## 2020-10-07 DIAGNOSIS — M25632 Stiffness of left wrist, not elsewhere classified: Secondary | ICD-10-CM | POA: Insufficient documentation

## 2020-10-07 DIAGNOSIS — R2689 Other abnormalities of gait and mobility: Secondary | ICD-10-CM | POA: Diagnosis present

## 2020-10-07 DIAGNOSIS — R2681 Unsteadiness on feet: Secondary | ICD-10-CM

## 2020-10-07 NOTE — Therapy (Signed)
[facility] [address] [place], [place], [address] Phone: [phone]   Fax:  [phone]  Physical Therapy Treatment  Patient Details  Name: Alexandra Norman MRN: [id number] Date of Birth: 09/03/1956 Referring Provider (PT): Carolann Littler   Encounter Date: 10/07/2020   PT End of Session - 10/07/20 1406    Visit Number 13    Number of Visits 21    Date for PT Re-Evaluation 10/14/20    Authorization Type Medicare so 10th visit progress note. Re-cert performed 92/33/00    PT Start Time 1403    PT Stop Time 1443    PT Time Calculation (min) 40 min    Equipment Utilized During Treatment Gait belt    Activity Tolerance Patient tolerated treatment well    Behavior During Therapy WFL for tasks assessed/performed           Past Medical History:  Diagnosis Date  . ADD (attention deficit disorder)   . Allergy   . Arrhythmia   . Arthritis   . Charcot-Marie-Tooth disease   . Depression   . Fibromyalgia   . Headache(784.0)   . Heart murmur   . Hyperlipidemia   . Osteoarthritis   . Osteoporosis   . Rotator cuff tear   . Thyroid disease   . Ulcer   . UTI (urinary tract infection)     Past Surgical History:  Procedure Laterality Date  . CARDIAC SURGERY  1982   ASD repair   . CARPAL TUNNEL RELEASE  2007   bilateral  . CATARACT EXTRACTION, BILATERAL Bilateral   . FOOT SURGERY  2001 and 2005   bilateral  . HERNIA REPAIR    . WRIST SURGERY Left 08/12/2017    There were no vitals filed for this visit.   Subjective Assessment - 10/07/20 1406    Subjective Pt had a fall just before Christmas rolling right ankle when walking sock foot in home. She was a little sore at right lateral ankle and bruised left knee. Made her more fearful again. She also reports that she is noticing more heat/redness time to time at left lateral ankle that kind of comes and goes. Seems to come on when feet are up a lot or covered with  blanket.    Pertinent History CMT    Patient Stated Goals Pt wants to increase her muscle tone to try to improve strength and balance.    Currently in Pain? No/denies    Pain Onset In the past 7 days                             [facility] Adult PT Treatment/Exercise - 10/07/20 1416      Transfers   Transfers Sit to Stand;Stand to Sit    Sit to Stand 6: Modified independent (Device/Increase time)    Stand to Sit 6: Modified independent (Device/Increase time)      Ambulation/Gait   Ambulation/Gait Yes    Ambulation/Gait Assistance 5: Supervision    Ambulation/Gait Assistance Details Pt has increased lateral sway. Slight supination at right ankle.    Ambulation Distance (Feet) 345 Feet    Assistive device None    Gait Pattern Step-through pattern    Ambulation Surface Level;Indoor      Neuro Re-ed    Neuro Re-ed Details  Standing in front of mat with chair in front: feet together x 30 sec and 30 sec eyes closed then with head turns left/right  x 10. SBA for safety. Pt had increased sway with eyes closed.      Exercises   Exercises Other Exercises    Other Exercises  Sit to stand x 10 from mat without UE support.Pt was cued to try to keep knees apart a bit more. Sit to stand x 5 with red theraband around thighs to get more glut med activation. Side side stepping with hand held support with red theraband around thighs 6' x 6.      Lumbar Exercises: Aerobic   Nustep x 6 min level 6 with BUE and BLE for strengthening and stamina.                  PT Education - 10/07/20 1845    Education Details PT recommended trying to wear some sort of shoes in house to provide some ankle stability but pt reports she does not like wearing any shoes in home.    Person(s) Educated Patient    Methods Explanation    Comprehension Verbalized understanding            PT Short Term Goals - 07/18/20 0746      PT SHORT TERM GOAL #1   Title Pt will be independent with initial HEP  for strengthening/ROM and balance.    Baseline Pt reports doing exercises every other day. Denies any questions on current exercises as of 07/17/20    Time 4    Period Weeks    Status Achieved    Target Date 07/18/20      PT SHORT TERM GOAL #2   Title Berg Balance will be assessed and LTG written to further assess balance.    Baseline performed on 07/02/20 with 31/56    Time 4    Period Weeks    Status Achieved    Target Date 07/18/20      PT SHORT TERM GOAL #3   Title Pt will decrease TUG from 24 sec to <20 sec for improved balance and functional mobility.    Baseline 06/18/20 24 sec; 07/15/20 13 s TUG - goal met.    Time 4    Period Weeks    Status Achieved    Target Date 07/18/20      PT SHORT TERM GOAL #4   Title Pt will ambulate >300' mod I with RW versus cane on level surfaces for improved household mobility and safety.    Baseline 07/15/20 315' supervision with cane    Time 4    Period Weeks    Status Partially Met    Target Date 07/18/20             PT Long Term Goals - 09/23/20 1113      PT LONG TERM GOAL #1   Title Pt will be independent with progressive HEP for strengthening/ROM and balance to continue gains on own.    Time 8    Period Weeks    Status Partially Met    Target Date 10/14/20      PT LONG TERM GOAL #2   Title Pt will be able to perform sit to stand x 5 without hands for improved functional strength safely.    Baseline 1 rep without hands CGA.; performed 5 reps without hands (09/16/20)    Time 8    Period Weeks    Status Achieved      PT LONG TERM GOAL #3   Title Pt will increase gait speed from 0.28ms to >0.590m for improved safety with  household mobility.    Baseline 0.71ms on 06/18/20. 09/11/20 0.743m    Time 8    Period Weeks    Status Achieved      PT LONG TERM GOAL #4   Title Pt will ambulate >500' on outdoor paved surfaces with LRAD mod I for improved short community distances.    Baseline 556' indoors with small based quad  cane within 4 min -- refused attempting outdoors due to weather (09/16/20)    Time 4    Period Weeks    Status Revised      PT LONG TERM GOAL #5   Title Pt will ambulate up/down flight of stairs with cane and 1 railing mod I for improved access to home.    Baseline Ascended/descend 16 steps with railing on R, SPC on L mod I (09/16/20)    Time 8    Period Weeks    Status Achieved      PT LONG TERM GOAL #6   Title Pt will increase Berg Balance from 31/56 to >36/56 for improved blance and decreased fall risk. (target date: 10/14/2020)    Baseline 07/02/20 31/56, 09/11/20 36/56    Time 4    Period Weeks    Status Revised      PT LONG TERM GOAL #7   Title Pt will be able to tolerate 6 minutes of ambulating without seated rest break    Baseline tolerated ~4.5 minutes 09/16/20    Time 4    Period Weeks    Status New                 Plan - 10/07/20 1842    Clinical Impression Statement Pt was more fearful after her recent fall but overall was showing improving stability with less UE support with balance activities.    Personal Factors and Comorbidities Comorbidity 1    Comorbidities depression    Examination-Activity Limitations Stand;Locomotion Level;Stairs;Transfers    Examination-Participation Restrictions Community Activity;Cleaning    Stability/Clinical Decision Making Stable/Uncomplicated    Rehab Potential Good    PT Frequency 2x / week    PT Duration 4 weeks    PT Treatment/Interventions ADLs/Self Care Home Management;Cryotherapy;Moist Heat;DME Instruction;Gait training;Stair training;Functional mobility training;Therapeutic activities;Therapeutic exercise;Orthotic Fit/Training;Patient/family education;Neuromuscular re-education;Balance training;Passive range of motion;Vestibular;Manual techniques    PT Next Visit Plan Start checking LTGs as all due by 1/8/46or recert versus d/c. Continue to work on proximal LE strengthening, continue to progress balance training to SLS and  more compliant surfaces. Continue progressing endurance as able.    PT Home Exercise Plan Access Code: [email or web address]    [email or web address] Agree with Plan of Care Patient           Patient will benefit from skilled therapeutic intervention in order to improve the following deficits and impairments:  Abnormal gait,Decreased activity tolerance,Decreased balance,Decreased endurance,Decreased mobility,Decreased knowledge of use of DME,Decreased range of motion,Decreased strength,Impaired flexibility,Impaired sensation  Visit Diagnosis: Other abnormalities of gait and mobility  Muscle weakness (generalized)  Unsteadiness on feet     Problem List Patient Active Problem List   Diagnosis Date Noted  . Hyperglycemia 07/09/2019  . Constipation 04/29/2015  . Encounter to establish care 04/29/2015  . Peripheral neuropathy 09/07/2012  . Charcot-Marie-Tooth disease 01/31/2012  . Hypothyroid 01/31/2012  . Atrial septal defect 01/31/2012  . Hyperlipidemia 01/31/2012  . GERD (gastroesophageal reflux disease) 01/31/2012  . Osteoarthritis 01/31/2012  . Osteoporosis 01/31/2012  . Depression 01/31/2012  . ADD (attention deficit disorder) 01/31/2012  . Fibromyalgia  01/31/2012  . Allergic rhinitis 01/31/2012    Electa Sniff, PT, DPT, NCS 10/07/2020, 6:46 PM  [facility] [address] [place], [place], [address] Phone: [phone]   Fax:  [phone]  Name: Alexandra Norman MRN: [id number] Date of Birth: 04/05/1956

## 2020-10-09 ENCOUNTER — Other Ambulatory Visit: Payer: Self-pay

## 2020-10-09 ENCOUNTER — Ambulatory Visit: Payer: Medicare Other

## 2020-10-09 DIAGNOSIS — M6281 Muscle weakness (generalized): Secondary | ICD-10-CM

## 2020-10-09 DIAGNOSIS — R2689 Other abnormalities of gait and mobility: Secondary | ICD-10-CM

## 2020-10-09 DIAGNOSIS — R2681 Unsteadiness on feet: Secondary | ICD-10-CM

## 2020-10-09 NOTE — Therapy (Signed)
[facility] [address] [place], [place], [address] Phone: [phone]   Fax:  [phone]  Physical Therapy Treatment  Patient Details  Name: Alexandra Norman MRN: [id number] Date of Birth: [birth year] Referring Provider (PT): Carolann Littler   Encounter Date: 10/09/2020   PT End of Session - 10/09/20 1406    Visit Number 14    Number of Visits 21    Date for PT Re-Evaluation 10/14/20    Authorization Type Medicare so 10th visit progress note. Re-cert performed 85/63/14    PT Start Time 1405    PT Stop Time 1444    PT Time Calculation (min) 39 min    Equipment Utilized During Treatment Gait belt    Activity Tolerance Patient tolerated treatment well    Behavior During Therapy WFL for tasks assessed/performed           Past Medical History:  Diagnosis Date  . ADD (attention deficit disorder)   . Allergy   . Arrhythmia   . Arthritis   . Charcot-Marie-Tooth disease   . Depression   . Fibromyalgia   . Headache(784.0)   . Heart murmur   . Hyperlipidemia   . Osteoarthritis   . Osteoporosis   . Rotator cuff tear   . Thyroid disease   . Ulcer   . UTI (urinary tract infection)     Past Surgical History:  Procedure Laterality Date  . CARDIAC SURGERY  1982   ASD repair   . CARPAL TUNNEL RELEASE  2007   bilateral  . CATARACT EXTRACTION, BILATERAL Bilateral   . FOOT SURGERY  2001 and 2005   bilateral  . HERNIA REPAIR    . WRIST SURGERY Left 08/12/2017    There were no vitals filed for this visit.   Subjective Assessment - 10/09/20 1407    Subjective Pt reports she is a little tired as today is one of the days she goes with friend to her treatments to watch her dog early.    Pertinent History CMT    Patient Stated Goals Pt wants to increase her muscle tone to try to improve strength and balance.    Currently in Pain? No/denies    Pain Onset In the past 7 days                              [facility] Adult PT Treatment/Exercise - 10/09/20 1409      Transfers   Transfers Sit to Stand;Stand to Sit    Sit to Stand 6: Modified independent (Device/Increase time)    Five time sit to stand comments  12.95 sec from mat without hands    Stand to Sit 6: Modified independent (Device/Increase time)      Ambulation/Gait   Ambulation/Gait Yes    Ambulation/Gait Assistance 6: Modified independent (Device/Increase time);5: Supervision    Ambulation/Gait Assistance Details supervision on grass. Pt unsteady at times with occasional narrow BOS with increased supination on right at times.    Ambulation Distance (Feet) 850 Feet    Assistive device Straight cane   with quad tip   Gait Pattern Step-through pattern    Ambulation Surface Level;Unlevel;Indoor;Outdoor;Paved;Grass      Standardized Balance Assessment   Standardized Balance Assessment Berg Balance Test      Berg Balance Test   Sit to Stand Able to stand without using hands and stabilize independently    Standing Unsupported Able to stand safely 2 minutes  Sitting with Back Unsupported but Feet Supported on Floor or Stool Able to sit safely and securely 2 minutes    Stand to Sit Sits safely with minimal use of hands    Transfers Able to transfer safely, minor use of hands    Standing Unsupported with Eyes Closed Able to stand 10 seconds with supervision    Standing Ubsupported with Feet Together Able to place feet together independently and stand for 1 minute with supervision    From Standing, Reach Forward with Outstretched Arm Can reach confidently >25 cm (10")    From Standing Position, Pick up Object from Floor Able to pick up shoe, needs supervision    From Standing Position, Turn to Look Behind Over each Shoulder Turn sideways only but maintains balance    Turn 360 Degrees Able to turn 360 degrees safely in 4 seconds or less    Standing Unsupported, Alternately Place Feet on Step/Stool Able  to complete 4 steps without aid or supervision    Standing Unsupported, One Foot in Front Needs help to step but can hold 15 seconds    Standing on One Leg Tries to lift leg/unable to hold 3 seconds but remains standing independently    Total Score 43                  PT Education - 10/09/20 1857    Education Details PT discussed results of testing from today. Next visit possible d/c versus recert if there are other things pt would like to work on.    Person(s) Educated Patient    Methods Explanation    Comprehension Verbalized understanding            PT Short Term Goals - 07/18/20 0746      PT SHORT TERM GOAL #1   Title Pt will be independent with initial HEP for strengthening/ROM and balance.    Baseline Pt reports doing exercises every other day. Denies any questions on current exercises as of 07/17/20    Time 4    Period Weeks    Status Achieved    Target Date 07/18/20      PT SHORT TERM GOAL #2   Title Berg Balance will be assessed and LTG written to further assess balance.    Baseline performed on 07/02/20 with 31/56    Time 4    Period Weeks    Status Achieved    Target Date 07/18/20      PT SHORT TERM GOAL #3   Title Pt will decrease TUG from 24 sec to <20 sec for improved balance and functional mobility.    Baseline 06/18/20 24 sec; 07/15/20 13 s TUG - goal met.    Time 4    Period Weeks    Status Achieved    Target Date 07/18/20      PT SHORT TERM GOAL #4   Title Pt will ambulate >300' mod I with RW versus cane on level surfaces for improved household mobility and safety.    Baseline 07/15/20 315' supervision with cane    Time 4    Period Weeks    Status Partially Met    Target Date 07/18/20             PT Long Term Goals - 10/09/20 1408      PT LONG TERM GOAL #1   Title Pt will be independent with progressive HEP for strengthening/ROM and balance to continue gains on own.    Baseline Pt reports  doing her exercises some but not quite as  much as prescribed. 10/09/20    Time 8    Period Weeks    Status Partially Met      PT LONG TERM GOAL #2   Title Pt will be able to perform sit to stand x 5 without hands for improved functional strength safely.    Baseline 1 rep without hands CGA.; performed 5 reps without hands (09/16/20)    Time 8    Period Weeks    Status Achieved      PT LONG TERM GOAL #3   Title Pt will increase gait speed from 0.36ms to >0.556m for improved safety with household mobility.    Baseline 0.3453mon 06/18/20. 09/11/20 0.67m1m  Time 8    Period Weeks    Status Achieved      PT LONG TERM GOAL #4   Title Pt will ambulate >500' on outdoor paved surfaces with LRAD mod I for improved short community distances.    Baseline 556' indoors with small based quad cane within 4 min -- refused attempting outdoors due to weather (09/16/20). 10/09/20 Pt ambulated outside on paved surfaces mod I with cane with quad tip on sidewalk and supervision on grass.    Time 4    Period Weeks    Status Achieved      PT LONG TERM GOAL #5   Title Pt will ambulate up/down flight of stairs with cane and 1 railing mod I for improved access to home.    Baseline Ascended/descend 16 steps with railing on R, SPC on L mod I (09/16/20)    Time 8    Period Weeks    Status Achieved      PT LONG TERM GOAL #6   Title Pt will increase Berg Balance from 31/56 to >36/56 for improved blance and decreased fall risk. (target date: 10/14/2020)    Baseline 07/02/20 31/56, 09/11/20 36/56. 10/09/20 43/56    Time 4    Period Weeks    Status Achieved      PT LONG TERM GOAL #7   Title Pt will be able to tolerate 6 minutes of ambulating without seated rest break    Baseline tolerated ~4.5 minutes 09/16/20. 10/09/20 Pt ambulated 7 min 40 sec outside today.    Time 4    Period Weeks    Status Achieved                 Plan - 10/09/20 1858    Clinical Impression Statement Pt met all but 1 LTG as not being quite as consistent on HEP and wants to  get some more information on progression. Showing improving balance with increase in Berg score to 43/56 but still fall risk. Pt able to perform 5 sit to stands without UE support in 12.95 sec showing improving balance and functional strength. Increased gait distances outside with cane today mod I/supervision.    Personal Factors and Comorbidities Comorbidity 1    Comorbidities depression    Examination-Activity Limitations Stand;Locomotion Level;Stairs;Transfers    Examination-Participation Restrictions Community Activity;Cleaning    Stability/Clinical Decision Making Stable/Uncomplicated    Rehab Potential Good    PT Frequency 2x / week    PT Duration 4 weeks    PT Treatment/Interventions ADLs/Self Care Home Management;Cryotherapy;Moist Heat;DME Instruction;Gait training;Stair training;Functional mobility training;Therapeutic activities;Therapeutic exercise;Orthotic Fit/Training;Patient/family education;Neuromuscular re-education;Balance training;Passive range of motion;Vestibular;Manual techniques    PT Next Visit Plan Did pt come up with anything else she would like  to work towards for new goals. Review HEP and discuss progression with possible d/c next visit.    PT Home Exercise Plan Access Code: [email or web address]    [id number] and Agree with Plan of Care Patient           Patient will benefit from skilled therapeutic intervention in order to improve the following deficits and impairments:  Abnormal gait,Decreased activity tolerance,Decreased balance,Decreased endurance,Decreased mobility,Decreased knowledge of use of DME,Decreased range of motion,Decreased strength,Impaired flexibility,Impaired sensation  Visit Diagnosis: Other abnormalities of gait and mobility  Muscle weakness (generalized)  Unsteadiness on feet     Problem List Patient Active Problem List   Diagnosis Date Noted  . Hyperglycemia 07/09/2019  . S/P ORIF (open reduction internal fixation) fracture 08/12/2016  . Pain,  wrist, left 08/09/2016  . Constipation 04/29/2015  . Encounter to establish care 04/29/2015  . Peripheral neuropathy 09/07/2012  . Charcot-Marie-Tooth disease 01/31/2012  . Hypothyroid 01/31/2012  . Atrial septal defect 01/31/2012  . Hyperlipidemia 01/31/2012  . GERD (gastroesophageal reflux disease) 01/31/2012  . Osteoarthritis 01/31/2012  . Osteoporosis 01/31/2012  . Depression 01/31/2012  . ADD (attention deficit disorder) 01/31/2012  . Fibromyalgia 01/31/2012  . Allergic rhinitis 01/31/2012    Electa Sniff, PT, DPT, NCS 10/09/2020, 7:01 PM  [facility] [address] [place], [place], [address] Phone: [phone]   Fax:  [phone]  Name: Alexandra Norman MRN: [id number] Date of Birth: Jan 08, 1956

## 2020-10-14 ENCOUNTER — Other Ambulatory Visit: Payer: Self-pay

## 2020-10-14 ENCOUNTER — Ambulatory Visit: Payer: Medicare Other

## 2020-10-14 DIAGNOSIS — R2689 Other abnormalities of gait and mobility: Secondary | ICD-10-CM

## 2020-10-14 DIAGNOSIS — M6281 Muscle weakness (generalized): Secondary | ICD-10-CM

## 2020-10-14 DIAGNOSIS — R2681 Unsteadiness on feet: Secondary | ICD-10-CM

## 2020-10-14 NOTE — Patient Instructions (Signed)
Access Code: [email or web address] URL: [email or web address] Date: 10/14/2020 Prepared by: Elmer Bales  Exercises Sit to Stand with Counter Support - 1 x daily - 5 x weekly - 1 sets - 10 reps Seated Hip Abduction with Resistance - 1 x daily - 5 x weekly - 1 sets - 10 reps - 5 hold Seated March - 1 x daily - 5 x weekly - 1 sets - 10 reps Seated Ankle Plantar Flexion with Resistance Loop - 1 x daily - 5 x weekly - 1 sets - 10 reps Small Range Straight Leg Raise - 1 x daily - 5 x weekly - 1 sets - 10 reps Bridge with Hip Abduction and Resistance - 1 x daily - 5 x weekly - 1 sets - 10 reps Standing Hip Abduction with Counter Support - 1 x daily - 5 x weekly - 1 sets - 10 reps Romberg Stance with Head Nods on Foam Pad - 1 x daily - 7 x weekly - 1 sets - 10 reps Romberg Stance on Foam Pad with Head Rotation - 1 x daily - 7 x weekly - 1 sets - 10 reps

## 2020-10-15 NOTE — Therapy (Signed)
[facility] [address] [place], [place], [address] Phone: [phone]   Fax:  [phone]  Physical Therapy Treatment/Discharge Summary  Patient Details  Name: Alexandra Norman MRN: [id number] Date of Birth: [birth year] Referring Provider (PT): Carolann Littler  PHYSICAL THERAPY DISCHARGE SUMMARY  Visits from Start of Care: 15  Current functional level related to goals / functional outcomes: See clinical impression and goals for more information.   Remaining deficits: Distal muscle weakness, fall risk   Education / Equipment: HEP  Plan: Patient agrees to discharge.  Patient goals were met. Patient is being discharged due to meeting the stated rehab goals.  ?????       Encounter Date: 10/14/2020   PT End of Session - 10/14/20 1447    Visit Number 15    Number of Visits 21    Date for PT Re-Evaluation 10/14/20    Authorization Type Medicare so 10th visit progress note. Re-cert performed 26/94/85    PT Start Time 1445    PT Stop Time 1530    PT Time Calculation (min) 45 min    Equipment Utilized During Treatment Gait belt    Activity Tolerance Patient tolerated treatment well    Behavior During Therapy WFL for tasks assessed/performed           Past Medical History:  Diagnosis Date  . ADD (attention deficit disorder)   . Allergy   . Arrhythmia   . Arthritis   . Charcot-Marie-Tooth disease   . Depression   . Fibromyalgia   . Headache(784.0)   . Heart murmur   . Hyperlipidemia   . Osteoarthritis   . Osteoporosis   . Rotator cuff tear   . Thyroid disease   . Ulcer   . UTI (urinary tract infection)     Past Surgical History:  Procedure Laterality Date  . CARDIAC SURGERY  1982   ASD repair   . CARPAL TUNNEL RELEASE  2007   bilateral  . CATARACT EXTRACTION, BILATERAL Bilateral   . FOOT SURGERY  2001 and 2005   bilateral  . HERNIA REPAIR    . WRIST SURGERY Left 08/12/2017    There were  no vitals filed for this visit.   Subjective Assessment - 10/14/20 1448    Subjective Pt reports that she is tired today as her CPAP was not working right so didn't sleep well. She has been working on her exercises. Doing balance ones in corner on floor and not pillow still as feels safer.    Pertinent History CMT    Patient Stated Goals Pt wants to increase her muscle tone to try to improve strength and balance.    Currently in Pain? No/denies    Pain Onset In the past 7 days                             [facility] Adult PT Treatment/Exercise - 10/14/20 1452      Exercises   Exercises Other Exercises    Other Exercises  Reviewed HEP on medbridge as noted below and discussed progression for each exercise.            Exercises Sit to Stand with Counter Support - 1 x daily - 5 x weekly - 1 sets - 10 reps- discussed Seated Hip Abduction with Resistance - 1 x daily - 5 x weekly - 1 sets - 10 reps - 5 hold- performed with blue theraband Seated March -  1 x daily - 5 x weekly - 1 sets - 10 reps- performed with blue theraband Small Range Straight Leg Raise - 1 x daily - 5 x weekly - 1 sets - 10 reps Bridge with Hip Abduction and Resistance - 1 x daily - 5 x weekly - 1 sets - 10 reps- performed with blue theraband Standing Hip Abduction with Counter Support - 1 x daily - 5 x weekly - 1 sets - 10 reps-performed with blue theraband Romberg Stance with Head Nods on Foam Pad - 1 x daily - 7 x weekly - 1 sets - 10 reps-performed on floor Romberg Stance on Foam Pad with Head Rotation - 1 x daily - 7 x weekly - 1 sets - 10 reps-performed on floor  Discussed progression of theraband colors red>green>blue as exercises get easier as well as increasing reps. Also discussed adding pillow with balance exercises when they get easier. Pt verbalized understanding.       PT Education - 10/15/20 0815    Education Details HEP progression. Discharge today as planned. Spoke with Gerald Stabs at [facility] about consult for possible right ankle brace. Pt going to follow-up with [facility] about this in future.    Person(s) Educated Patient    Methods Explanation    Comprehension Verbalized understanding            PT Short Term Goals - 07/18/20 0746      PT SHORT TERM GOAL #1   Title Pt will be independent with initial HEP for strengthening/ROM and balance.    Baseline Pt reports doing exercises every other day. Denies any questions on current exercises as of 07/17/20    Time 4    Period Weeks    Status Achieved    Target Date 07/18/20      PT SHORT TERM GOAL #2   Title Berg Balance will be assessed and LTG written to further assess balance.    Baseline performed on 07/02/20 with 31/56    Time 4    Period Weeks    Status Achieved    Target Date 07/18/20      PT SHORT TERM GOAL #3   Title Pt will decrease TUG from 24 sec to <20 sec for improved balance and functional mobility.    Baseline 06/18/20 24 sec; 07/15/20 13 s TUG - goal met.    Time 4    Period Weeks    Status Achieved    Target Date 07/18/20      PT SHORT TERM GOAL #4   Title Pt will ambulate >300' mod I with RW versus cane on level surfaces for improved household mobility and safety.    Baseline 07/15/20 315' supervision with cane    Time 4    Period Weeks    Status Partially Met    Target Date 07/18/20             PT Long Term Goals - 10/15/20 0814      PT LONG TERM GOAL #1   Title Pt will be independent with progressive HEP for strengthening/ROM and balance to continue gains on own.    Baseline Pt reports doing her exercises some but not quite as much as prescribed. 10/09/20. 10/15/19 PT reviewed HEP with pt and discussed progression. Pt denies any questions.    Time 8    Period Weeks    Status Achieved      PT LONG TERM GOAL #2   Title Pt will  be able to perform sit to stand x 5 without hands for improved functional strength safely.    Baseline 1 rep without hands CGA.; performed  5 reps without hands (09/16/20)    Time 8    Period Weeks    Status Achieved      PT LONG TERM GOAL #3   Title Pt will increase gait speed from 0.80ms to >0.597m for improved safety with household mobility.    Baseline 0.3484mon 06/18/20. 09/11/20 0.64m64m  Time 8    Period Weeks    Status Achieved      PT LONG TERM GOAL #4   Title Pt will ambulate >500' on outdoor paved surfaces with LRAD mod I for improved short community distances.    Baseline 556' indoors with small based quad cane within 4 min -- refused attempting outdoors due to weather (09/16/20). 10/09/20 Pt ambulated outside on paved surfaces mod I with cane with quad tip on sidewalk and supervision on grass.    Time 4    Period Weeks    Status Achieved      PT LONG TERM GOAL #5   Title Pt will ambulate up/down flight of stairs with cane and 1 railing mod I for improved access to home.    Baseline Ascended/descend 16 steps with railing on R, SPC on L mod I (09/16/20)    Time 8    Period Weeks    Status Achieved      PT LONG TERM GOAL #6   Title Pt will increase Berg Balance from 31/56 to >36/56 for improved blance and decreased fall risk. (target date: 10/14/2020)    Baseline 07/02/20 31/56, 09/11/20 36/56. 10/09/20 43/56    Time 4    Period Weeks    Status Achieved      PT LONG TERM GOAL #7   Title Pt will be able to tolerate 6 minutes of ambulating without seated rest break    Baseline tolerated ~4.5 minutes 09/16/20. 10/09/20 Pt ambulated 7 min 40 sec outside today.    Time 4    Period Weeks    Status Achieved                 Plan - 10/15/20 0816    Clinical Impression Statement Pt has met all goals at this time. She has ben given HEP and have discussed how to progress exercises when able on own. She improved significantly on Berg Balance increasing score to 43/56 but still high fall risk. Demonstrated increased functional strength and balance by decrease in 5 x sit to stand score and being able to perform sit  to stand without UE support. She is walking with cane with quad tip at this time. PT discharging and pt in agreement.    Personal Factors and Comorbidities Comorbidity 1    Comorbidities depression    Examination-Activity Limitations Stand;Locomotion Level;Stairs;Transfers    Examination-Participation Restrictions Community Activity;Cleaning    Stability/Clinical Decision Making Stable/Uncomplicated    Rehab Potential Good    PT Frequency 2x / week    PT Duration 4 weeks    PT Treatment/Interventions ADLs/Self Care Home Management;Cryotherapy;Moist Heat;DME Instruction;Gait training;Stair training;Functional mobility training;Therapeutic activities;Therapeutic exercise;Orthotic Fit/Training;Patient/family education;Neuromuscular re-education;Balance training;Passive range of motion;Vestibular;Manual techniques    PT Next Visit Plan Discharge today.    PT Home Exercise Plan Access Code: [email or web address]    [email or web address] Agree with Plan of Care Patient           Patient will benefit from  skilled therapeutic intervention in order to improve the following deficits and impairments:  Abnormal gait,Decreased activity tolerance,Decreased balance,Decreased endurance,Decreased mobility,Decreased knowledge of use of DME,Decreased range of motion,Decreased strength,Impaired flexibility,Impaired sensation  Visit Diagnosis: Other abnormalities of gait and mobility  Muscle weakness (generalized)  Unsteadiness on feet     Problem List Patient Active Problem List   Diagnosis Date Noted  . Hyperglycemia 07/09/2019  . S/P ORIF (open reduction internal fixation) fracture 08/12/2016  . Pain, wrist, left 08/09/2016  . Constipation 04/29/2015  . Encounter to establish care 04/29/2015  . Peripheral neuropathy 09/07/2012  . Charcot-Marie-Tooth disease 01/31/2012  . Hypothyroid 01/31/2012  . Atrial septal defect 01/31/2012  . Hyperlipidemia 01/31/2012  . GERD (gastroesophageal reflux disease) 01/31/2012  .  Osteoarthritis 01/31/2012  . Osteoporosis 01/31/2012  . Depression 01/31/2012  . ADD (attention deficit disorder) 01/31/2012  . Fibromyalgia 01/31/2012  . Allergic rhinitis 01/31/2012    Electa Sniff, PT, DPT, NCS 10/15/2020, 8:19 AM  [facility] [address] [place], [place], [address] Phone: [phone]   Fax:  [phone]  Name: Alexandra Norman MRN: [id number] Date of Birth: [birth year]

## 2020-10-16 ENCOUNTER — Other Ambulatory Visit: Payer: Self-pay

## 2020-10-16 ENCOUNTER — Encounter: Payer: Self-pay | Admitting: Occupational Therapy

## 2020-10-16 ENCOUNTER — Ambulatory Visit: Payer: Medicare Other

## 2020-10-16 ENCOUNTER — Ambulatory Visit: Payer: Medicare Other | Admitting: Occupational Therapy

## 2020-10-16 DIAGNOSIS — M6281 Muscle weakness (generalized): Secondary | ICD-10-CM

## 2020-10-16 DIAGNOSIS — R2689 Other abnormalities of gait and mobility: Secondary | ICD-10-CM | POA: Diagnosis not present

## 2020-10-16 DIAGNOSIS — M25632 Stiffness of left wrist, not elsewhere classified: Secondary | ICD-10-CM

## 2020-10-16 NOTE — Therapy (Signed)
[facility] [address] [place], [place], [address] Phone: [phone]   Fax:  [phone]  Occupational Therapy Treatment  Patient Details  Name: Alexandra Norman MRN: [id number] Date of Birth: 27-Jun-1956 Referring Provider (OT): Alexandra Norman   Encounter Date: 10/16/2020   OT End of Session - 10/16/20 1728    Visit Number 1    Number of Visits 9    Date for OT Re-Evaluation 12/30/20    Authorization Type Medicare    Progress Note Due on Visit 10    OT Start Time 1617    OT Stop Time 1710    OT Time Calculation (min) 53 min    Activity Tolerance Patient tolerated treatment well    Behavior During Therapy [facility] for tasks assessed/performed           Past Medical History:  Diagnosis Date  . ADD (attention deficit disorder)   . Allergy   . Arrhythmia   . Arthritis   . Charcot-Marie-Norman disease   . Depression   . Fibromyalgia   . Headache(784.0)   . Heart murmur   . Hyperlipidemia   . Osteoarthritis   . Osteoporosis   . Rotator cuff tear   . Thyroid disease   . Ulcer   . UTI (urinary tract infection)     Past Surgical History:  Procedure Laterality Date  . CARDIAC SURGERY  1982   ASD repair   . CARPAL TUNNEL RELEASE  2007   bilateral  . CATARACT EXTRACTION, BILATERAL Bilateral   . FOOT SURGERY  2001 and 2005   bilateral  . HERNIA REPAIR    . WRIST SURGERY Left 08/12/2017    There were no vitals filed for this visit.   Subjective Assessment - 10/16/20 1625    Subjective  I had a year of immobilization for my left arm, and I need to get as much activity and strength back as possible    Pertinent History Charcot Alexandra Norman    Currently in Pain? No/denies    Pain Score 0-No pain              [facility] OT Assessment - 10/16/20 0001      Assessment   Medical Diagnosis charcot marie Norman muscular atrophy.    Referring Provider (OT) Alexandra Norman    Onset Date/Surgical Date 08/04/20     Hand Dominance Right    Prior Therapy Patient with multiple surgeries for left arm, has ot      Precautions   Precautions Other (comment)    Precaution Comments no lifting over 5 lbs in LUE    Other Brace/Splint Knee brace, shoe lift      Balance Screen   Has the patient fallen in the past 6 months Yes    How many times? 1      Prior Function   Level of Independence Independent with basic ADLs    Vocation Retired;On disability    Vocation Requirements retired Paramedic    Leisure play on tablet, watch TV, Take friend to appointments twice weekly      ADL   Eating/Feeding Modified independent    Grooming Independent    Upper Body Bathing Modified independent    Lower Body Bathing Modified independent    Upper Body Dressing Minimal assistance    Lower Body Dressing Increased time    Toilet Transfer Modified independent    Toileting - Solicitor -  Hygiene Independent  Tub/Shower Transfer Modified independent    ADL comments Has shower seat isn't using, has two grab bars      IADL   Prior Level of Function Light Housekeeping independent    Light Housekeeping Maintains house alone or with occasional assistance      Written Expression   Dominant Hand Right      Cognition   Overall Cognitive Status Within Functional Limits for tasks assessed      Observation/Other Assessments   Focus on Therapeutic Outcomes (FOTO)  NA      Posture/Postural Control   Posture/Postural Control Postural limitations    Postural Limitations --   Leg left discrepency, right ankle instability     Sensation   Light Touch Appears Intact    Hot/Cold Appears Intact      Coordination   Gross Motor Movements are Fluid and Coordinated Yes    9 Hole Peg Test Right;Left    Right 9 Hole Peg Test 53.69    Left 9 Hole Peg Test 51.72      Perception   Perception Within Functional Limits      Praxis   Praxis Intact      ROM / Strength   AROM / PROM /  Strength AROM;Strength      AROM   Overall AROM  Deficits    Overall AROM Comments limited forearm, wrist flex/extension,      Strength   Overall Strength Within functional limits for tasks performed    Overall Strength Comments Right UE Proximally 4/5, LUE Proximally 4+/5      Hand Function   Right Hand Gross Grasp Impaired    Right Hand Grip (lbs) 53    Right Hand Lateral Pinch 8 lbs    Right Hand 3 Point Pinch 10 lbs    Left Hand Gross Grasp Impaired    Left Hand Grip (lbs) 45    Left Hand Lateral Pinch 9 lbs    Left 3 point pinch 6 lbs                            OT Education - 10/16/20 1727    Education Details Potential goals for OT    Person(s) Educated Patient    Methods Explanation    Comprehension Verbalized understanding            OT Short Term Goals - 10/16/20 1734      OT SHORT TERM GOAL #1   Title Patient will complete an HEP designed to improve left wrist, hand, and forearm range of motion due 11/30/20    Time 4    Period Weeks    Status New    Target Date 11/30/20      OT SHORT TERM GOAL #2   Title Patient will complete an HEP designed to improve strength in Bilateral hands    Time 4    Period Weeks    Status New             OT Long Term Goals - 10/16/20 1736      OT LONG TERM GOAL #1   Title Patient will demonstrate sufficient range of motion (left wrist extension and supination) to accept change at drive thru window    Time 8    Period Weeks    Status New    Target Date 12/30/20      OT LONG TERM GOAL #2   Title Patient will demonstrate at least 3  lb increase in left grip strength.    Time 8    Period Weeks    Status New      OT LONG TERM GOAL #3   Title Patient will complete an HEP designed to UE conditioning program    Time 8    Period Weeks    Status New                 Plan - 10/16/20 1729    Clinical Impression Statement Patient is a 65 yr old woman with longstanding history of Charcot Alexandra Lias  Norman who fell in 2017, fracturing Left wrist.  She has since had two ulnar shortening surgeries, and experienced prolonged immobilization after surgery.  Patient with limited supination, wrist flexion and extension in left UE.  Patient with decreased grip and pinch strength.  Prior to this fall , Right side more involved from CMT,and patient concerned regarding loss of strength on LUE.  Patient will benefit from skilled OT intervention to improve functional use and strength in LUE.    OT Occupational Profile and History Detailed Assessment- Review of Records and additional review of physical, cognitive, psychosocial history related to current functional performance    Occupational performance deficits (Please refer to evaluation for details): ADL's;IADL's    Body Structure / Function / Physical Skills ADL;Coordination;UE functional use;Balance;Pain;IADL;Flexibility;FMC;Strength;ROM    Rehab Potential Good    Clinical Decision Making Limited treatment options, no task modification necessary    Comorbidities Affecting Occupational Performance: May have comorbidities impacting occupational performance    Modification or Assistance to Complete Evaluation  No modification of tasks or assist necessary to complete eval    OT Frequency 1x / week    OT Duration 8 weeks    OT Treatment/Interventions Self-care/ADL training;Therapeutic exercise;Manual Therapy;Passive range of motion;DME and/or AE instruction;Therapeutic activities;Patient/family education;Paraffin;Moist Heat    Plan soft tissue mob for increased supination left.  Putty exercises L/R    Consulted and Agree with Plan of Care Patient           Patient will benefit from skilled therapeutic intervention in order to improve the following deficits and impairments:   Body Structure / Function / Physical Skills: ADL,Coordination,UE functional use,Balance,Pain,IADL,Flexibility,FMC,Strength,ROM       Visit Diagnosis: Muscle weakness  (generalized)  Stiffness of left wrist, not elsewhere classified    Problem List Patient Active Problem List   Diagnosis Date Noted  . Hyperglycemia 07/09/2019  . S/P ORIF (open reduction internal fixation) fracture 08/12/2016  . Pain, wrist, left 08/09/2016  . Constipation 04/29/2015  . Encounter to establish care 04/29/2015  . Peripheral neuropathy 09/07/2012  . Charcot-Marie-Norman disease 01/31/2012  . Hypothyroid 01/31/2012  . Atrial septal defect 01/31/2012  . Hyperlipidemia 01/31/2012  . GERD (gastroesophageal reflux disease) 01/31/2012  . Osteoarthritis 01/31/2012  . Osteoporosis 01/31/2012  . Depression 01/31/2012  . ADD (attention deficit disorder) 01/31/2012  . Fibromyalgia 01/31/2012  . Allergic rhinitis 01/31/2012    Collier Salina, OTR/L 10/16/2020, 5:40 PM   [facility] [address] [place], [place], [address] Phone: [phone]   Fax:  [phone]  Name: Alexandra Norman MRN: [id number] Date of Birth: 09-03-1956

## 2020-10-21 ENCOUNTER — Ambulatory Visit: Payer: Medicare Other

## 2020-10-23 ENCOUNTER — Ambulatory Visit: Payer: Medicare Other | Admitting: Occupational Therapy

## 2020-10-23 ENCOUNTER — Ambulatory Visit: Payer: Medicare Other

## 2020-10-28 ENCOUNTER — Ambulatory Visit: Payer: Medicare Other

## 2020-10-30 ENCOUNTER — Ambulatory Visit: Payer: Medicare Other

## 2020-10-30 ENCOUNTER — Ambulatory Visit: Payer: Medicare Other | Admitting: Occupational Therapy

## 2020-10-30 ENCOUNTER — Other Ambulatory Visit: Payer: Self-pay

## 2020-10-30 ENCOUNTER — Encounter: Payer: Self-pay | Admitting: Occupational Therapy

## 2020-10-30 DIAGNOSIS — M25632 Stiffness of left wrist, not elsewhere classified: Secondary | ICD-10-CM

## 2020-10-30 DIAGNOSIS — R2689 Other abnormalities of gait and mobility: Secondary | ICD-10-CM | POA: Diagnosis not present

## 2020-10-30 DIAGNOSIS — M6281 Muscle weakness (generalized): Secondary | ICD-10-CM

## 2020-10-30 NOTE — Therapy (Signed)
[facility] [address] [place], [place], [address] Phone: [phone]   Fax:  [phone]  Occupational Therapy Treatment  Patient Details  Name: Alexandra Norman MRN: [id number] Date of Birth: 1956-07-19 Referring Provider (OT): Evelena Peat   Encounter Date: 10/30/2020   OT End of Session - 10/30/20 1448    Visit Number 2    Number of Visits 9    Date for OT Re-Evaluation 12/30/20    Authorization Type Medicare    Progress Note Due on Visit 10    OT Start Time 1400    OT Stop Time 1445    OT Time Calculation (min) 45 min    Activity Tolerance Patient tolerated treatment well    Behavior During Therapy [facility] for tasks assessed/performed           Past Medical History:  Diagnosis Date  . ADD (attention deficit disorder)   . Allergy   . Arrhythmia   . Arthritis   . Charcot-Marie-Tooth disease   . Depression   . Fibromyalgia   . Headache(784.0)   . Heart murmur   . Hyperlipidemia   . Osteoarthritis   . Osteoporosis   . Rotator cuff tear   . Thyroid disease   . Ulcer   . UTI (urinary tract infection)     Past Surgical History:  Procedure Laterality Date  . CARDIAC SURGERY  1982   ASD repair   . CARPAL TUNNEL RELEASE  2007   bilateral  . CATARACT EXTRACTION, BILATERAL Bilateral   . FOOT SURGERY  2001 and 2005   bilateral  . HERNIA REPAIR    . WRIST SURGERY Left 08/12/2017    There were no vitals filed for this visit.   Subjective Assessment - 10/30/20 1405    Subjective  I have a 1lb and a 3 lb weight at home.    Pertinent History Charcot [name]    Currently in Pain? No/denies    Pain Score 0-No pain                        OT Treatments/Exercises (OP) - 10/30/20 0001      ADLs   ADL Comments Reviewed short and long term goals with patient.  Patien tin agreement, and eager to build strength in left UE.      Shoulder Exercises: Seated   Other Seated Exercises UEB  Level 4 x 6 min      Wrist Exercises   Bar Weights/Barbell (Wrist Flexion) 2 lbs   15 reps   Bar Weights/Barbell (Wrist Extension) 2 lbs   15 reps   Bar Weights/Barbell (Radial Deviation) 1 lb   See patient instructions                 OT Education - 10/30/20 1443    Education Details Reviewed goals for OT, and started HEP with LUE    Person(s) Educated Patient    Methods Explanation;Demonstration;Tactile cues;Verbal cues;Handout    Comprehension Verbalized understanding;Returned demonstration            OT Short Term Goals - 10/30/20 1407      OT SHORT TERM GOAL #1   Title Patient will complete an HEP designed to improve left wrist, hand, and forearm range of motion due 11/30/20    Time 4    Status On-going    Target Date 11/30/20      OT SHORT TERM GOAL #2   Title Patient  will complete an HEP designed to improve strength in Bilateral hands    Time 4    Period Weeks    Status On-going             OT Long Term Goals - 10/30/20 1409      OT LONG TERM GOAL #1   Title Patient will demonstrate sufficient range of motion (left wrist extension and supination) to accept change at drive thru window    Time 8    Period Weeks    Status On-going      OT LONG TERM GOAL #2   Title Patient will demonstrate at least 3 lb increase in left grip strength.    Time 8    Period Weeks    Status On-going      OT LONG TERM GOAL #3   Title Patient will complete an HEP designed to UE conditioning program    Time 8    Period Weeks    Status On-going                 Plan - 10/30/20 1449    Clinical Impression Statement Patient is agreeable to OT goals and eager to regain functional strength and range of motion in LUE    OT Occupational Profile and History Detailed Assessment- Review of Records and additional review of physical, cognitive, psychosocial history related to current functional performance    Occupational performance deficits (Please refer to evaluation for  details): ADL's;IADL's    Body Structure / Function / Physical Skills ADL;Coordination;UE functional use;Balance;Pain;IADL;Flexibility;FMC;Strength;ROM    Rehab Potential Good    Clinical Decision Making Limited treatment options, no task modification necessary    Comorbidities Affecting Occupational Performance: May have comorbidities impacting occupational performance    Modification or Assistance to Complete Evaluation  No modification of tasks or assist necessary to complete eval    OT Frequency 1x / week    OT Duration 8 weeks    OT Treatment/Interventions Self-care/ADL training;Therapeutic exercise;Manual Therapy;Passive range of motion;DME and/or AE instruction;Therapeutic activities;Patient/family education;Paraffin;Moist Heat    Plan soft tissue mob for increased supination left.  Putty exercises L/R    OT Home Exercise Plan Resistance training left wrist    Consulted and Agree with Plan of Care Patient           Patient will benefit from skilled therapeutic intervention in order to improve the following deficits and impairments:   Body Structure / Function / Physical Skills: ADL,Coordination,UE functional use,Balance,Pain,IADL,Flexibility,FMC,Strength,ROM       Visit Diagnosis: Muscle weakness (generalized)  Stiffness of left wrist, not elsewhere classified    Problem List Patient Active Problem List   Diagnosis Date Noted  . Hyperglycemia 07/09/2019  . S/P ORIF (open reduction internal fixation) fracture 08/12/2016  . Pain, wrist, left 08/09/2016  . Constipation 04/29/2015  . Encounter to establish care 04/29/2015  . Peripheral neuropathy 09/07/2012  . Charcot-Marie-Tooth disease 01/31/2012  . Hypothyroid 01/31/2012  . Atrial septal defect 01/31/2012  . Hyperlipidemia 01/31/2012  . GERD (gastroesophageal reflux disease) 01/31/2012  . Osteoarthritis 01/31/2012  . Osteoporosis 01/31/2012  . Depression 01/31/2012  . ADD (attention deficit disorder) 01/31/2012   . Fibromyalgia 01/31/2012  . Allergic rhinitis 01/31/2012    Collier Salina, OTR/L 10/30/2020, 2:52 PM  [facility] [address] [place], [place], [address] Phone: [phone]   Fax:  [phone]  Name: Alexandra Norman MRN: [id number] Date of Birth: 09/28/1956

## 2020-10-30 NOTE — Patient Instructions (Signed)
Wrist weight 2lb, and 1lb  2lb wrist weigt 1)  Rest the underside of your forearm on your thigh, holding the dumb bell in your left hand.  Extend your wrist x 15.   2)  Rest the backside of your forearm on your thigh, holding the dumb bell in your left hand.  Flex your wrist x 15.   1lb dumb bell 1)  Rest the pinky side of your forearm on your thigh, tip hand up and down like a handshake.

## 2020-11-06 ENCOUNTER — Ambulatory Visit: Payer: Medicare Other | Admitting: Occupational Therapy

## 2020-11-14 ENCOUNTER — Other Ambulatory Visit: Payer: Self-pay

## 2020-11-14 ENCOUNTER — Ambulatory Visit: Payer: Medicare Other | Admitting: Occupational Therapy

## 2020-11-17 ENCOUNTER — Encounter: Payer: Self-pay | Admitting: Family Medicine

## 2020-11-17 ENCOUNTER — Other Ambulatory Visit: Payer: Self-pay

## 2020-11-17 ENCOUNTER — Ambulatory Visit (INDEPENDENT_AMBULATORY_CARE_PROVIDER_SITE_OTHER): Payer: Medicare Other | Admitting: Family Medicine

## 2020-11-17 VITALS — BP 138/70 | HR 86 | Ht 63.0 in | Wt 180.0 lb

## 2020-11-17 DIAGNOSIS — L819 Disorder of pigmentation, unspecified: Secondary | ICD-10-CM | POA: Diagnosis not present

## 2020-11-17 MED ORDER — BACLOFEN 10 MG PO TABS
10.0000 mg | ORAL_TABLET | Freq: Two times a day (BID) | ORAL | 1 refills | Status: DC
Start: 2020-11-17 — End: 2021-05-15

## 2020-11-17 NOTE — Patient Instructions (Signed)
This sound more vascular.  Consider trying some ice- or ice pack- 20-25 minutes to see if this helps.

## 2020-11-17 NOTE — Progress Notes (Signed)
Established Patient Office Visit  Subjective:  Patient ID: Alexandra Norman, female    DOB: Oct 17, 1955  Age: 65 y.o. MRN: [id number]  CC:  Chief Complaint  Patient presents with  . Rash    Left ankle    HPI Alexandra Norman presents for intermittent erythema left foot mostly dorsally and laterally.  She states back in June she had fracture of the left proximal metatarsal.  This is fully healed.  She has no pain with ambulation.  She does have history of Charcot-Marie-Tooth disease.  No history of claudication.  She states that regarding her left foot this been going on now for several weeks.  Her symptoms tend to be exclusively at night when she lays down in bed.  She does have some chronic neuropathy takes high-dose gabapentin for that.  She has not noted any rashes.  No involvement of the leg.  She has somewhat of a burning quality and redness which is usually transient by morning has gone away.  She is more concerned with etiology.  She wondered if this somehow may be tied her fracture last June.  She has not seen any visible swelling.  Past Medical History:  Diagnosis Date  . ADD (attention deficit disorder)   . Allergy   . Arrhythmia   . Arthritis   . Charcot-Marie-Tooth disease   . Depression   . Fibromyalgia   . Headache(784.0)   . Heart murmur   . Hyperlipidemia   . Osteoarthritis   . Osteoporosis   . Rotator cuff tear   . Thyroid disease   . Ulcer   . UTI (urinary tract infection)     Past Surgical History:  Procedure Laterality Date  . CARDIAC SURGERY  1982   ASD repair   . CARPAL TUNNEL RELEASE  2007   bilateral  . CATARACT EXTRACTION, BILATERAL Bilateral   . FOOT SURGERY  2001 and 2005   bilateral  . HERNIA REPAIR    . WRIST SURGERY Left 08/12/2017    Family History  Problem Relation Age of Onset  . Alcohol abuse Mother   . Arthritis Mother   . Mental illness Mother        Suicide in 59  . Thyroid disease Mother   . Alcohol abuse Father   .  Arthritis Father   . Mental illness Sister        Suicide in 57  . Breast cancer Sister     Social History   Socioeconomic History  . Marital status: Divorced    Spouse name: Not on file  . Number of children: 2  . Years of education: Not on file  . Highest education level: Not on file  Occupational History  . Occupation: Sports coach  Tobacco Use  . Smoking status: Never Smoker  . Smokeless tobacco: Never Used  Vaping Use  . Vaping Use: Never used  Substance and Sexual Activity  . Alcohol use: No    Alcohol/week: 0.0 standard drinks  . Drug use: No  . Sexual activity: Not on file  Other Topics Concern  . Not on file  Social History Narrative   Works for the [employer], has been there 25 years   Not married   Has a son (49) daughter (53). Has two grandchildren    Social Determinants of Health   Financial Resource Strain: Low Risk   . Difficulty of Paying Living Expenses: Not hard at all  Food Insecurity: No Food Insecurity  . Worried About Running  Out of Food in the Last Year: Never true  . Ran Out of Food in the Last Year: Never true  Transportation Needs: No Transportation Needs  . Lack of Transportation (Medical): No  . Lack of Transportation (Non-Medical): No  Physical Activity: Inactive  . Days of Exercise per Week: 0 days  . Minutes of Exercise per Session: 0 min  Stress: No Stress Concern Present  . Feeling of Stress : Not at all  Social Connections: Socially Isolated  . Frequency of Communication with Friends and Family: Once a week  . Frequency of Social Gatherings with Friends and Family: Once a week  . Attends Religious Services: Never  . Active Member of Clubs or Organizations: Yes  . Attends Banker Meetings: Never  . Marital Status: Divorced  Catering manager Violence: Not At Risk  . Fear of Current or Ex-Partner: No  . Emotionally Abused: No  . Physically Abused: No  . Sexually Abused: No    Outpatient Medications Prior to  Visit  Medication Sig Dispense Refill  . aspirin 325 MG tablet Take 975 mg by mouth every 6 (six) hours as needed.    Marland Kitchen buPROPion (WELLBUTRIN XL) 300 MG 24 hr tablet TAKE 1 TABLET BY MOUTH EVERY DAY 90 tablet 3  . calcium carbonate (OSCAL) 1500 (600 Ca) MG TABS tablet Take by mouth.    . Coenzyme Q10 (CO Q10) 100 MG CAPS Take by mouth.    . Cranberry 400 MG CAPS Take by mouth.    . diclofenac sodium (VOLTAREN) 1 % GEL Apply up to 4 times per day as needed for pain. 1 Tube 3  . esomeprazole (NEXIUM) 40 MG capsule Take 1 capsule (40 mg total) by mouth daily before breakfast. 90 capsule 3  . fluticasone (FLONASE) 50 MCG/ACT nasal spray SPRAY 2 SPRAYS INTO EACH NOSTRIL EVERY DAY 16 g 2  . gabapentin (NEURONTIN) 800 MG tablet TAKE 1 TABLET BY MOUTH FOUR TIMES A DAY 360 tablet 0  . lamoTRIgine (LAMICTAL) 150 MG tablet Take 150 mg by mouth daily.     Marland Kitchen loratadine (CLARITIN) 10 MG tablet Take 10 mg by mouth daily.    . Multiple Vitamins-Minerals (MULTIVITAMIN & MINERAL PO) Take 1 tablet by mouth daily.    . Nutritional Supplements (ADULT GROWTH HORMONE SUPPORT PO) Take 1 capsule by mouth daily.     . ondansetron (ZOFRAN) 4 MG tablet     . OXcarbazepine (TRILEPTAL) 150 MG tablet Take 150 mg by mouth 2 (two) times daily.    . Probiotic Product (PROBIOTIC COLON SUPPORT) CAPS Take 1 capsule by mouth daily.    . QUEtiapine (SEROQUEL) 50 MG tablet Take 150 mg by mouth at bedtime.     . traMADol (ULTRAM) 50 MG tablet TAKE 2 TABLETS BY MOUTH TWICE A DAY 120 tablet 2  . triamcinolone cream (KENALOG) 0.1 % APPLY TO AFFECTED AREA EVERY DAY 30 g 0  . vitamin C (ASCORBIC ACID) 500 MG tablet Take 500 mg by mouth daily.    Marland Kitchen VITAMIN D, CHOLECALCIFEROL, PO Take 2,000 Units by mouth daily.     . baclofen (LIORESAL) 10 MG tablet TAKE 1 TABLET BY MOUTH THREE TIMES A DAY 270 tablet 0   No facility-administered medications prior to visit.    Allergies  Allergen Reactions  . Penicillins     hives  . Statins      ROS Review of Systems  Constitutional: Negative for chills and fever.  Respiratory: Negative for shortness of  breath.   Cardiovascular: Negative for chest pain and palpitations.      Objective:    Physical Exam Vitals reviewed.  Constitutional:      Appearance: Normal appearance.  Cardiovascular:     Rate and Rhythm: Normal rate and regular rhythm.  Pulmonary:     Effort: Pulmonary effort is normal.     Breath sounds: Normal breath sounds.  Musculoskeletal:     Comments: Left foot reveals no edema.  Good capillary perfusion.  No bony tenderness.  Good distal pulses.  Neurological:     Mental Status: She is alert.     BP 138/70   Pulse 86   Ht 5\' 3"  (1.6 m)   Wt 180 lb (81.6 kg)   SpO2 94%   BMI 31.89 kg/m  Wt Readings from Last 3 Encounters:  11/17/20 180 lb (81.6 kg)  01/30/20 181 lb (82.1 kg)  12/19/19 183 lb 8 oz (83.2 kg)     Health Maintenance Due  Topic Date Due  . HIV Screening  Never done  . PAP SMEAR-Modifier  10/04/2004  . MAMMOGRAM  10/18/2005  . INFLUENZA VACCINE  05/04/2020  . PNA vac Low Risk Adult (1 of 2 - PCV13) 10/18/2020    There are no preventive care reminders to display for this patient.  Lab Results  Component Value Date   TSH 5.05 (H) 11/17/2016   Lab Results  Component Value Date   WBC 6.1 11/10/2016   HGB 13.7 11/10/2016   HCT 40.5 11/10/2016   MCV 88.8 11/10/2016   PLT 336.0 11/10/2016   Lab Results  Component Value Date   NA 142 11/10/2016   K 4.2 11/10/2016   CO2 29 11/10/2016   GLUCOSE 111 (H) 11/10/2016   BUN 10 11/10/2016   CREATININE 0.57 11/10/2016   BILITOT 0.4 11/10/2016   ALKPHOS 88 11/10/2016   AST 24 11/10/2016   ALT 25 11/10/2016   PROT 6.3 11/10/2016   ALBUMIN 4.0 11/10/2016   CALCIUM 9.4 11/10/2016   ANIONGAP 8 05/08/2015   GFR 114.58 11/10/2016   Lab Results  Component Value Date   CHOL 305 (H) 11/10/2016   Lab Results  Component Value Date   HDL 37.40 (L) 11/10/2016   No results  found for: [facility] Lab Results  Component Value Date   TRIG 313.0 (H) 11/10/2016   Lab Results  Component Value Date   CHOLHDL 8 11/10/2016   Lab Results  Component Value Date   HGBA1C 5.8 (A) 12/19/2019      Assessment & Plan:   Patient describes intermittent erythema left foot laterally and dorsally.  This sounds like more of a vasodilation phenomenon.  Symptoms are very transient.  Symptoms would not suggest any cellulitis or eczematous type rash or contact dermatitis.  -We recommend she consider trial of icing with next episode and also possible take a picture to get back to 12/21/2019 to further evaluate  -She is requesting refills of her baclofen which she takes for some chronic spasticity  Meds ordered this encounter  Medications  . baclofen (LIORESAL) 10 MG tablet    Sig: Take 1 tablet (10 mg total) by mouth 2 (two) times daily.    Dispense:  180 tablet    Refill:  1    Follow-up: No follow-ups on file.    [place], MD

## 2020-11-20 ENCOUNTER — Encounter: Payer: Medicare Other | Admitting: Occupational Therapy

## 2020-11-27 ENCOUNTER — Ambulatory Visit: Payer: Medicare Other | Admitting: Occupational Therapy

## 2020-12-04 ENCOUNTER — Ambulatory Visit: Payer: Medicare Other | Admitting: Occupational Therapy

## 2020-12-10 ENCOUNTER — Encounter: Payer: Self-pay | Admitting: Occupational Therapy

## 2020-12-10 NOTE — Therapy (Signed)
[facility] [address] [place], [place], [address] Phone: [phone]   Fax:  [phone]  December 10, 2020    No Recipients  Occupational Therapy Discharge Summary   Patient: Alexandra Norman MRN: [id number] Date of Birth: 12-21-1955  Diagnosis: No diagnosis found.  Referring Provider (OT): Evelena Peat   The above patient had been seen in Occupational Therapy 2 times of 9 treatments scheduled  The treatment consisted of eval and one treatment The patient is: Undergoing additional surgery for non union forearm/wrist  Subjective: Patient has called to cancel remaining appointments  Discharge Findings: NA  Functional Status at Discharge: Have not seen patient since 10/30/20      Sincerely,  Kamonte Mcmichen, Lanice Shirts, OT  CC No Recipients  [facility] [address] [place], [place], [address] Phone: [phone]   Fax:  [phone]  Patient: Alexandra Norman MRN: [id number] Date of Birth: 03-25-1956

## 2020-12-11 ENCOUNTER — Ambulatory Visit: Payer: Medicare Other | Admitting: Occupational Therapy

## 2021-01-12 ENCOUNTER — Other Ambulatory Visit: Payer: Self-pay | Admitting: Family Medicine

## 2021-01-12 NOTE — Telephone Encounter (Signed)
Tramadol last filled 09/22/20 Last OV 11/20/2020  Pt is due for a physical.

## 2021-02-08 ENCOUNTER — Emergency Department (HOSPITAL_COMMUNITY)
Admission: EM | Admit: 2021-02-08 | Discharge: 2021-02-08 | Disposition: A | Discharge status: Home / Self Care | Payer: Medicare Other | Attending: Emergency Medicine | Admitting: Emergency Medicine

## 2021-02-08 ENCOUNTER — Encounter (HOSPITAL_COMMUNITY): Payer: Self-pay

## 2021-02-08 ENCOUNTER — Other Ambulatory Visit: Payer: Self-pay

## 2021-02-08 ENCOUNTER — Emergency Department (HOSPITAL_COMMUNITY): Payer: Medicare Other

## 2021-02-08 DIAGNOSIS — S0990XA Unspecified injury of head, initial encounter: Secondary | ICD-10-CM

## 2021-02-08 DIAGNOSIS — E039 Hypothyroidism, unspecified: Secondary | ICD-10-CM | POA: Insufficient documentation

## 2021-02-08 DIAGNOSIS — Y92002 Bathroom of unspecified non-institutional (private) residence single-family (private) house as the place of occurrence of the external cause: Secondary | ICD-10-CM | POA: Diagnosis not present

## 2021-02-08 DIAGNOSIS — W182XXA Fall in (into) shower or empty bathtub, initial encounter: Secondary | ICD-10-CM | POA: Diagnosis not present

## 2021-02-08 DIAGNOSIS — Z7982 Long term (current) use of aspirin: Secondary | ICD-10-CM | POA: Insufficient documentation

## 2021-02-08 MED ORDER — GABAPENTIN 300 MG PO CAPS
800.0000 mg | ORAL_CAPSULE | Freq: Once | ORAL | Status: AC
  Administered 2021-02-08: 800 mg via ORAL
  Filled 2021-02-08: qty 2

## 2021-02-08 MED ORDER — ACETAMINOPHEN 325 MG PO TABS
650.0000 mg | ORAL_TABLET | Freq: Once | ORAL | Status: AC
Start: 2021-02-08 — End: 2021-02-08
  Administered 2021-02-08: 650 mg via ORAL
  Filled 2021-02-08: qty 2

## 2021-02-08 NOTE — Discharge Instructions (Addendum)
Your CT scan did not show any internal hemorrhage or fracture or other pathology.  Take Tylenol and your pain medications at home as needed for pain.  Advise follow-up with your doctor within 3 to 4 days.  Advised immediate return for worsening symptoms or any additional concerns.

## 2021-02-08 NOTE — ED Notes (Signed)
Patient transported to CT 

## 2021-02-08 NOTE — ED Triage Notes (Signed)
Patient BIB [facility] from home fell in shower yesterday. 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 Patient falls often from that. Was able to get self up, hit head on back of shower. Did not lose consciousness or is not on blood thinners. Patient woke up dizzy today, more lethargic, sleepy. Patient complaining of intermittent disturbances and headache.   156/84 'HR 84 CBG 126

## 2021-02-08 NOTE — ED Provider Notes (Signed)
[facility]-EMERGENCY DEPT Provider Note   CSN: [id number] Arrival date & time: 02/08/21  1937     History Chief Complaint  Patient presents with  . Fall    Alexandra Norman is a 65 y.o. female.  Patient presents with chief complaint of fall and headache.  She states that she has tried, read to syndrome and oftentimes loses her balance.  She was in the bathroom yesterday when she lost her balance and fell hit the back of her head.  Denies loss of consciousness.  But has persistent intermittent headaches and a sensation of dizziness at times.  Denies any vomiting denies loss of consciousness.  Denies fevers or cough.        Past Medical History:  Diagnosis Date  . ADD (attention deficit disorder)   . Allergy   . Arrhythmia   . Arthritis   . Charcot-Marie-Tooth disease   . Depression   . Fibromyalgia   . Headache(784.0)   . Heart murmur   . Hyperlipidemia   . Osteoarthritis   . Osteoporosis   . Rotator cuff tear   . Thyroid disease   . Ulcer   . UTI (urinary tract infection)     Patient Active Problem List   Diagnosis Date Noted  . Hyperglycemia 07/09/2019  . S/P ORIF (open reduction internal fixation) fracture 08/12/2016  . Pain, wrist, left 08/09/2016  . Constipation 04/29/2015  . Encounter to establish care 04/29/2015  . Peripheral neuropathy 09/07/2012  . Charcot-Marie-Tooth disease 01/31/2012  . Hypothyroid 01/31/2012  . Atrial septal defect 01/31/2012  . Hyperlipidemia 01/31/2012  . GERD (gastroesophageal reflux disease) 01/31/2012  . Osteoarthritis 01/31/2012  . Osteoporosis 01/31/2012  . Depression 01/31/2012  . ADD (attention deficit disorder) 01/31/2012  . Fibromyalgia 01/31/2012  . Allergic rhinitis 01/31/2012    Past Surgical History:  Procedure Laterality Date  . CARDIAC SURGERY  1982   ASD repair   . CARPAL TUNNEL RELEASE  2007   bilateral  . CATARACT EXTRACTION, BILATERAL Bilateral   . FOOT SURGERY  2001 and 2005    bilateral  . HERNIA REPAIR    . WRIST SURGERY Left 08/12/2017     OB History   No obstetric history on file.     Family History  Problem Relation Age of Onset  . Alcohol abuse Mother   . Arthritis Mother   . Mental illness Mother        Suicide in 53  . Thyroid disease Mother   . Alcohol abuse Father   . Arthritis Father   . Mental illness Sister        Suicide in 52  . Breast cancer Sister     Social History   Tobacco Use  . Smoking status: Never Smoker  . Smokeless tobacco: Never Used  Vaping Use  . Vaping Use: Never used  Substance Use Topics  . Alcohol use: No    Alcohol/week: 0.0 standard drinks  . Drug use: No    Home Medications Prior to Admission medications   Medication Sig Start Date End Date Taking? Authorizing Provider  aspirin 325 MG tablet Take 975 mg by mouth every 6 (six) hours as needed.    [provider]  baclofen (LIORESAL) 10 MG tablet Take 1 tablet (10 mg total) by mouth 2 (two) times daily. 11/17/20   Burchette, Elberta Fortis, MD  buPROPion (WELLBUTRIN XL) 300 MG 24 hr tablet TAKE 1 TABLET BY MOUTH EVERY DAY 02/15/20   Burchette, Elberta Fortis, MD  calcium carbonate (OSCAL) 1500 (600 Ca) MG TABS tablet Take by mouth.    [provider]  Coenzyme Q10 (CO Q10) 100 MG CAPS Take by mouth.    [provider]  Cranberry 400 MG CAPS Take by mouth.    [provider]  diclofenac sodium (VOLTAREN) 1 % GEL Apply up to 4 times per day as needed for pain. 11/24/15   Burchette, Elberta Fortis, MD  esomeprazole (NEXIUM) 40 MG capsule Take 1 capsule (40 mg total) by mouth daily before breakfast. 11/30/12   Burchette, Elberta Fortis, MD  fluticasone (FLONASE) 50 MCG/ACT nasal spray SPRAY 2 SPRAYS INTO EACH NOSTRIL EVERY DAY 08/15/17   Burchette, Elberta Fortis, MD  gabapentin (NEURONTIN) 800 MG tablet TAKE 1 TABLET BY MOUTH FOUR TIMES A DAY 01/12/21   Burchette, Elberta Fortis, MD  lamoTRIgine (LAMICTAL) 150 MG tablet Take 150 mg by mouth daily.  10/18/19    [provider]  loratadine (CLARITIN) 10 MG tablet Take 10 mg by mouth daily.    [provider]  Multiple Vitamins-Minerals (MULTIVITAMIN & MINERAL PO) Take 1 tablet by mouth daily.    [provider]  Nutritional Supplements (ADULT GROWTH HORMONE SUPPORT PO) Take 1 capsule by mouth daily.     [provider]  ondansetron (ZOFRAN) 4 MG tablet  11/01/16   [provider]  OXcarbazepine (TRILEPTAL) 150 MG tablet Take 150 mg by mouth 2 (two) times daily.    [provider]  Probiotic Product (PROBIOTIC COLON SUPPORT) CAPS Take 1 capsule by mouth daily.    [provider]  QUEtiapine (SEROQUEL) 50 MG tablet Take 150 mg by mouth at bedtime.     [provider]  traMADol (ULTRAM) 50 MG tablet TAKE 2 TABLETS BY MOUTH TWICE A DAY 01/12/21   Burchette, Elberta Fortis, MD  triamcinolone cream (KENALOG) 0.1 % APPLY TO AFFECTED AREA EVERY DAY 03/02/17   Burchette, Elberta Fortis, MD  vitamin C (ASCORBIC ACID) 500 MG tablet Take 500 mg by mouth daily.    [provider]  VITAMIN D, CHOLECALCIFEROL, PO Take 2,000 Units by mouth daily.     [provider]    Allergies    Penicillins and Statins  Review of Systems   Review of Systems  Constitutional: Negative for fever.  HENT: Negative for ear pain.   Eyes: Negative for pain.  Respiratory: Negative for cough.   Cardiovascular: Negative for chest pain.  Gastrointestinal: Negative for abdominal pain.  Genitourinary: Negative for flank pain.  Musculoskeletal: Negative for back pain.  Skin: Negative for rash.  Neurological: Positive for headaches.    Physical Exam Updated Vital Signs BP (!) 143/64 (BP Location: Right Arm)   Pulse 72   Temp 98.6 F (37 C)   Resp 13   Ht 5\' 3"  (1.6 m)   Wt 81.6 kg   SpO2 95%   BMI 31.87 kg/m   Physical Exam Constitutional:      General: She is not in acute distress.    Appearance: Normal appearance.  HENT:     Head: Normocephalic.      Nose: Nose normal.  Eyes:     Extraocular Movements: Extraocular movements intact.  Cardiovascular:     Rate and Rhythm: Normal rate.  Pulmonary:     Effort: Pulmonary effort is normal.  Musculoskeletal:        General: Normal range of motion.     Cervical back: Normal range of motion.  Neurological:  General: No focal deficit present.     Mental Status: She is alert and oriented to person, place, and time. Mental status is at baseline.     Cranial Nerves: No cranial nerve deficit.     Motor: No weakness.     ED Results / Procedures / Treatments   Labs (all labs ordered are listed, but only abnormal results are displayed) Labs Reviewed - No data to display  EKG None  Radiology CT Head Wo Contrast  Result Date: 02/08/2021 CLINICAL DATA:  Status post trauma. EXAM: CT HEAD WITHOUT CONTRAST TECHNIQUE: Contiguous axial images were obtained from the base of the skull through the vertex without intravenous contrast. COMPARISON:  None. FINDINGS: Brain: There is mild cerebral atrophy with widening of the extra-axial spaces and ventricular dilatation. There are areas of decreased attenuation within the white matter tracts of the supratentorial brain, consistent with microvascular disease changes. Vascular: No hyperdense vessel or unexpected calcification. Skull: Normal. Negative for fracture or focal lesion. Sinuses/Orbits: No acute finding. Other: None. IMPRESSION: 1. Generalized cerebral atrophy. 2. No acute intracranial abnormality. Electronically Signed   By: Aram Candela M.D.   On: 02/08/2021 20:30    Procedures Procedures   Medications Ordered in ED Medications  acetaminophen (TYLENOL) tablet 650 mg (650 mg Oral Given 02/08/21 2039)    ED Course  I have reviewed the triage vital signs and the nursing notes.  Pertinent labs & imaging results that were available during my care of the patient were reviewed by me and considered in my medical decision making (see chart for  details).    MDM Rules/Calculators/A&P                          CT of the head is unremarkable no acute findings.  Urology.  Patient advised follow-up again with her doctor within the week.  Advised immediate return for worsening pain fevers or any additional concerns.  Final Clinical Impression(s) / ED Diagnoses Final diagnoses:  None    Rx / DC Orders ED Discharge Orders    None       Cheryll Cockayne, MD 02/08/21 2041

## 2021-02-11 ENCOUNTER — Telehealth (INDEPENDENT_AMBULATORY_CARE_PROVIDER_SITE_OTHER): Payer: Medicare Other | Admitting: Family Medicine

## 2021-02-11 ENCOUNTER — Other Ambulatory Visit: Payer: Self-pay

## 2021-02-11 DIAGNOSIS — R2689 Other abnormalities of gait and mobility: Secondary | ICD-10-CM

## 2021-02-11 DIAGNOSIS — F0781 Postconcussional syndrome: Secondary | ICD-10-CM

## 2021-02-11 DIAGNOSIS — R682 Dry mouth, unspecified: Secondary | ICD-10-CM

## 2021-02-11 DIAGNOSIS — S0990XD Unspecified injury of head, subsequent encounter: Secondary | ICD-10-CM | POA: Diagnosis not present

## 2021-02-11 NOTE — Progress Notes (Signed)
Patient ID: Alexandra Norman, female   DOB: 1956-06-27, 65 y.o.   MRN: [id number]  This visit type was conducted due to national recommendations for restrictions regarding the COVID-19 pandemic in an effort to limit this patient's exposure and mitigate transmission in our community.   Virtual Visit via Telephone Note  I connected with Alexandra Norman on 02/11/21 at  4:45 PM EDT by telephone and verified that I am speaking with the correct person using two identifiers.   I discussed the limitations, risks, security and privacy concerns of performing an evaluation and management service by telephone and the availability of in person appointments. I also discussed with the patient that there may be a patient responsible charge related to this service. The patient expressed understanding and agreed to proceed.  Location patient: home Location provider: work or home office Participants present for the call: patient, provider Patient did not have a visit in the prior 7 days to address this/these issue(s).   History of Present Illness:    Alexandra Norman has history of hypothyroidism, Charcot-Marie-Tooth disease with chronic peripheral neuropathy, osteoporosis, history of recurrent depression, fibromyalgia.  Recent fall.  This occurred Saturday night.  She was in her bathroom and was leaning back to rinse her hair when she was leaning against a railing which gave way and she fell backwards striking occiput of her head.  There was no loss of consciousness.  She laid there for a while and eventually was able to get to her bed and she stayed awake for 12 hours intentionally.  She then called EMS because of some concerns for head injury.  She went to the ER and had a CAT scan which showed some chronic atrophy but no acute changes.  She states she had hematoma on the occipital area which is slowly going down the size.  She has some intermittent chronic headaches but no acute headache.  No nausea or vomiting.   Her main issue is she states she has had some mild cognitive impairment.  She had some difficulties finding the right word at times and more difficult time with simple math .  Marland Kitchen  She states she is usually excellent at math.  She does have chronic peripheral neuropathy and has had increasing balance issues recently.  Has had some other falls as well.  She also complains of dry mouth symptoms.  Recently saw ophthalmologist and was given drops for dry eyes She does have CPAP and does have humidifier.  He is on multiple medications per psychiatry including Seroquel which could be causing some dry mouth symptoms.  No polyuria.  No history of diabetes.  Past Medical History:  Diagnosis Date  . ADD (attention deficit disorder)   . Allergy   . Arrhythmia   . Arthritis   . Charcot-Marie-Tooth disease   . Depression   . Fibromyalgia   . Headache(784.0)   . Heart murmur   . Hyperlipidemia   . Osteoarthritis   . Osteoporosis   . Rotator cuff tear   . Thyroid disease   . Ulcer   . UTI (urinary tract infection)    Past Surgical History:  Procedure Laterality Date  . CARDIAC SURGERY  1982   ASD repair   . CARPAL TUNNEL RELEASE  2007   bilateral  . CATARACT EXTRACTION, BILATERAL Bilateral   . FOOT SURGERY  2001 and 2005   bilateral  . HERNIA REPAIR    . WRIST SURGERY Left 08/12/2017    reports that she has never smoked. She  has never used smokeless tobacco. She reports that she does not drink alcohol and does not use drugs. family history includes Alcohol abuse in her father and mother; Arthritis in her father and mother; Breast cancer in her sister; Mental illness in her mother and sister; Thyroid disease in her mother. Allergies  Allergen Reactions  . Penicillins     hives  . Statins       Observations/Objective: Patient sounds cheerful and well on the phone. I do not appreciate any SOB. Speech and thought processing are grossly intact. Patient reported vitals:  Assessment and  Plan:  #1 recent closed head injury with what sounds like postconcussion type symptoms.  She had some mild cognitive impairment.  Recent CT head unremarkable for acute changes  -Recommend plenty of rest physically and mentally and until fully recovered  #2 increasing balance problems.  Probably exacerbated by her chronic neuropathy  -Assess further at follow-up and discuss possible referral to physical therapy  #3 patient complains of dry mouth symptoms.  Also has had some dry eye symptoms.  Discussed possible origins such as medication related from Seroquel or other medication.  Also discussed possibility of autoimmune disorder such as sicca syndrome  -No recent labs and we recommended office follow-up next week for 30-minute assessment and will plan some lab work at that time.  Follow Up Instructions:  -Next week   [id number] 5-10 [id number] 11-20 99443 21-30 I did not refer this patient for an OV in the next 24 hours for this/these issue(s).  I discussed the assessment and treatment plan with the patient. The patient was provided an opportunity to ask questions and all were answered. The patient agreed with the plan and demonstrated an understanding of the instructions.   The patient was advised to call back or seek an in-person evaluation if the symptoms worsen or if the condition fails to improve as anticipated.  I provided 25 minutes of non-face-to-face time during this encounter.   Evelena Peat, MD

## 2021-02-18 ENCOUNTER — Other Ambulatory Visit: Payer: Self-pay

## 2021-02-18 ENCOUNTER — Ambulatory Visit (INDEPENDENT_AMBULATORY_CARE_PROVIDER_SITE_OTHER): Payer: Medicare Other | Admitting: Family Medicine

## 2021-02-18 ENCOUNTER — Encounter: Payer: Self-pay | Admitting: Family Medicine

## 2021-02-18 VITALS — BP 128/70 | HR 84 | Temp 97.7°F | Ht 63.0 in | Wt 174.5 lb

## 2021-02-18 DIAGNOSIS — S0990XD Unspecified injury of head, subsequent encounter: Secondary | ICD-10-CM

## 2021-02-18 DIAGNOSIS — Z79899 Other long term (current) drug therapy: Secondary | ICD-10-CM

## 2021-02-18 DIAGNOSIS — Z9181 History of falling: Secondary | ICD-10-CM

## 2021-02-18 DIAGNOSIS — R739 Hyperglycemia, unspecified: Secondary | ICD-10-CM

## 2021-02-18 LAB — CBC WITH DIFFERENTIAL/PLATELET
Basophils Absolute: 0 10*3/uL (ref 0.0–0.1)
Basophils Relative: 0.5 % (ref 0.0–3.0)
Eosinophils Absolute: 0.3 10*3/uL (ref 0.0–0.7)
Eosinophils Relative: 4.1 % (ref 0.0–5.0)
HCT: 35.2 % — ABNORMAL LOW (ref 36.0–46.0)
Hemoglobin: 11.5 g/dL — ABNORMAL LOW (ref 12.0–15.0)
Lymphocytes Relative: 30.9 % (ref 12.0–46.0)
Lymphs Abs: 2.3 10*3/uL (ref 0.7–4.0)
MCHC: 32.6 g/dL (ref 30.0–36.0)
MCV: 79.8 fl (ref 78.0–100.0)
Monocytes Absolute: 0.5 10*3/uL (ref 0.1–1.0)
Monocytes Relative: 7.4 % (ref 3.0–12.0)
Neutro Abs: 4.2 10*3/uL (ref 1.4–7.7)
Neutrophils Relative %: 57.1 % (ref 43.0–77.0)
Platelets: 374 10*3/uL (ref 150.0–400.0)
RBC: 4.41 Mil/uL (ref 3.87–5.11)
RDW: 16.8 % — ABNORMAL HIGH (ref 11.5–15.5)
WBC: 7.4 10*3/uL (ref 4.0–10.5)

## 2021-02-18 LAB — COMPREHENSIVE METABOLIC PANEL
ALT: 7 U/L (ref 0–35)
AST: 10 U/L (ref 0–37)
Albumin: 4.2 g/dL (ref 3.5–5.2)
Alkaline Phosphatase: 99 U/L (ref 39–117)
BUN: 14 mg/dL (ref 6–23)
CO2: 27 mEq/L (ref 19–32)
Calcium: 8.9 mg/dL (ref 8.4–10.5)
Chloride: 105 mEq/L (ref 96–112)
Creatinine, Ser: 0.77 mg/dL (ref 0.40–1.20)
GFR: 81 mL/min (ref >60.00)
Glucose, Bld: 70 mg/dL (ref 70–99)
Potassium: 3.7 mEq/L (ref 3.5–5.1)
Sodium: 141 mEq/L (ref 135–145)
Total Bilirubin: 0.3 mg/dL (ref 0.2–1.2)
Total Protein: 6.4 g/dL (ref 6.0–8.3)

## 2021-02-18 LAB — HEMOGLOBIN A1C: Hgb A1c MFr Bld: 6.1 % (ref 4.6–6.5)

## 2021-02-18 NOTE — Progress Notes (Signed)
Established Patient Office Visit  Subjective:  Patient ID: Alexandra Norman, female    DOB: September 01, 1956  Age: 65 y.o. MRN: [id number]  CC:  Chief Complaint  Patient presents with  . Follow-up    HPI Alexandra BeanCatherine Goshert presents for medical follow-up.  Refer to recent virtual visit from 02/11/2021 for details  Recent fall.  This occurred Saturday night.  She was in her bathroom and was leaning back to rinse her hair when she was leaning against a railing which gave way and she fell backwards striking occiput of her head.  There was no loss of consciousness.  She laid there for a while and eventually was able to get to her bed and she stayed awake for 12 hours intentionally.  She then called EMS because of some concerns for head injury.  She went to the ER and had a CAT scan which showed some chronic atrophy but no acute changes.  She states she had hematoma on the occipital area which is slowly going down the size.  She has some intermittent chronic headaches but no acute headache.  No nausea or vomiting.  Her main issue is she states she has had some mild cognitive impairment.  She had some difficulties finding the right word at times and more difficult time with simple math .  Marland Kitchen.  She states she is usually excellent at math.  She does have chronic peripheral neuropathy and has had increasing balance issues recently.  Has had some other falls as well.  She also complains of dry mouth symptoms.  Recently saw ophthalmologist and was given drops for dry eyes She does have CPAP and does have humidifier.  He is on multiple medications per psychiatry including Seroquel which could be causing some dry mouth symptoms.  No polyuria.  No history of diabetes.  She states she has been drinking more fluids and feels her dry mouth symptoms are actually improving.  She does have history of mild hyperglycemia with prior A1c last year 5.8%.  No other recent labs.  No significant polyuria.  She has had some PT  in the past for balance issues and she states she went through this last fall.  She is also made some adjustments with some additional handrails in her bathroom to help with stability there.  Family history is significant for sister with type 2 diabetes. Past Medical History:  Diagnosis Date  . ADD (attention deficit disorder)   . Allergy   . Arrhythmia   . Arthritis   . Charcot-Marie-Tooth disease   . Depression   . Fibromyalgia   . Headache(784.0)   . Heart murmur   . Hyperlipidemia   . Osteoarthritis   . Osteoporosis   . Rotator cuff tear   . Thyroid disease   . Ulcer   . UTI (urinary tract infection)     Past Surgical History:  Procedure Laterality Date  . CARDIAC SURGERY  1982   ASD repair   . CARPAL TUNNEL RELEASE  2007   bilateral  . CATARACT EXTRACTION, BILATERAL Bilateral   . FOOT SURGERY  2001 and 2005   bilateral  . HERNIA REPAIR    . WRIST SURGERY Left 08/12/2017    Family History  Problem Relation Age of Onset  . Alcohol abuse Mother   . Arthritis Mother   . Mental illness Mother        Suicide in [id number]  . Thyroid disease Mother   . Alcohol abuse Father   . Arthritis Father   .  Mental illness Sister        Suicide in 47  . Breast cancer Sister     Social History   Socioeconomic History  . Marital status: Divorced    Spouse name: Not on file  . Number of children: 2  . Years of education: Not on file  . Highest education level: Not on file  Occupational History  . Occupation: Sports coach  Tobacco Use  . Smoking status: Never Smoker  . Smokeless tobacco: Never Used  Vaping Use  . Vaping Use: Never used  Substance and Sexual Activity  . Alcohol use: No    Alcohol/week: 0.0 standard drinks  . Drug use: No  . Sexual activity: Not on file  Other Topics Concern  . Not on file  Social History Narrative   Works for the post [employer], has been there 25 years   Not married   Has a son (98) daughter (34). Has two grandchildren    Social  Determinants of Health   Financial Resource Strain: Low Risk   . Difficulty of Paying Living Expenses: Not hard at all  Food Insecurity: No Food Insecurity  . Worried About Programme researcher, broadcasting/film/video in the Last Year: Never true  . Ran Out of Food in the Last Year: Never true  Transportation Needs: No Transportation Needs  . Lack of Transportation (Medical): No  . Lack of Transportation (Non-Medical): No  Physical Activity: Inactive  . Days of Exercise per Week: 0 days  . Minutes of Exercise per Session: 0 min  Stress: No Stress Concern Present  . Feeling of Stress : Not at all  Social Connections: Socially Isolated  . Frequency of Communication with Friends and Family: Once a week  . Frequency of Social Gatherings with Friends and Family: Once a week  . Attends Religious Services: Never  . Active Member of Clubs or Organizations: Yes  . Attends Banker Meetings: Never  . Marital Status: Divorced  Catering manager Violence: Not At Risk  . Fear of Current or Ex-Partner: No  . Emotionally Abused: No  . Physically Abused: No  . Sexually Abused: No    Outpatient Medications Prior to Visit  Medication Sig Dispense Refill  . aspirin 325 MG tablet Take 975 mg by mouth every 6 (six) hours as needed.    . baclofen (LIORESAL) 10 MG tablet Take 1 tablet (10 mg total) by mouth 2 (two) times daily. 180 tablet 1  . buPROPion (WELLBUTRIN XL) 150 MG 24 hr tablet Take 3 tablets by mouth daily.    . calcium carbonate (OSCAL) 1500 (600 Ca) MG TABS tablet Take by mouth.    . Coenzyme Q10 (CO Q10) 100 MG CAPS Take by mouth.    . Cranberry 400 MG CAPS Take by mouth.    . diclofenac sodium (VOLTAREN) 1 % GEL Apply up to 4 times per day as needed for pain. 1 Tube 3  . esomeprazole (NEXIUM) 40 MG capsule Take 1 capsule (40 mg total) by mouth daily before breakfast. 90 capsule 3  . fluticasone (FLONASE) 50 MCG/ACT nasal spray SPRAY 2 SPRAYS INTO EACH NOSTRIL EVERY DAY 16 g 2  . gabapentin  (NEURONTIN) 800 MG tablet TAKE 1 TABLET BY MOUTH FOUR TIMES A DAY 360 tablet 1  . lamoTRIgine (LAMICTAL) 150 MG tablet Take 150 mg by mouth daily.     Marland Kitchen loratadine (CLARITIN) 10 MG tablet Take 10 mg by mouth daily.    . Multiple Vitamins-Minerals (MULTIVITAMIN &  MINERAL PO) Take 1 tablet by mouth daily.    . Nutritional Supplements (ADULT GROWTH HORMONE SUPPORT PO) Take 1 capsule by mouth daily.     . ondansetron (ZOFRAN) 4 MG tablet     . OXcarbazepine (TRILEPTAL) 150 MG tablet Take 150 mg by mouth 2 (two) times daily.    . Probiotic Product (PROBIOTIC COLON SUPPORT) CAPS Take 1 capsule by mouth daily.    . QUEtiapine (SEROQUEL) 50 MG tablet Take 150 mg by mouth at bedtime.     . traMADol (ULTRAM) 50 MG tablet TAKE 2 TABLETS BY MOUTH TWICE A DAY 120 tablet 1  . triamcinolone cream (KENALOG) 0.1 % APPLY TO AFFECTED AREA EVERY DAY 30 g 0  . vitamin C (ASCORBIC ACID) 500 MG tablet Take 500 mg by mouth daily.    Marland Kitchen VITAMIN D, CHOLECALCIFEROL, PO Take 2,000 Units by mouth daily.     Marland Kitchen buPROPion (WELLBUTRIN XL) 300 MG 24 hr tablet TAKE 1 TABLET BY MOUTH EVERY DAY 90 tablet 3   No facility-administered medications prior to visit.    Allergies  Allergen Reactions  . Penicillins     hives  . Statins     ROS Review of Systems  Constitutional: Negative for chills and fever.  Eyes: Negative for visual disturbance.  Respiratory: Negative for cough, chest tightness, shortness of breath and wheezing.   Cardiovascular: Negative for chest pain, palpitations and leg swelling.  Neurological: Negative for dizziness, seizures, syncope, weakness, light-headedness and headaches.      Objective:    Physical Exam Constitutional:      Appearance: Normal appearance.  HENT:     Head:     Comments: She has a minimal amount of bruising along the lower occipital region.  Only very small tender nodule noted lower occipital area. Cardiovascular:     Rate and Rhythm: Normal rate and regular rhythm.   Pulmonary:     Effort: Pulmonary effort is normal.     Breath sounds: Normal breath sounds.  Neurological:     General: No focal deficit present.     Mental Status: She is alert.     Cranial Nerves: No cranial nerve deficit.     BP 128/70   Pulse 84   Temp 97.7 F (36.5 C) (Oral)   Ht 5\' 3"  (1.6 m)   Wt 174 lb 8 oz (79.2 kg)   SpO2 98%   BMI 30.91 kg/m  Wt Readings from Last 3 Encounters:  02/18/21 174 lb 8 oz (79.2 kg)  02/08/21 179 lb 14.3 oz (81.6 kg)  11/17/20 180 lb (81.6 kg)     Health Maintenance Due  Topic Date Due  . HIV Screening  Never done  . PAP SMEAR-Modifier  10/04/2004  . MAMMOGRAM  10/18/2005  . PNA vac Low Risk Adult (1 of 2 - PCV13) Never done    There are no preventive care reminders to display for this patient.  Lab Results  Component Value Date   TSH 5.05 (H) 11/17/2016   Lab Results  Component Value Date   WBC 6.1 11/10/2016   HGB 13.7 11/10/2016   HCT 40.5 11/10/2016   MCV 88.8 11/10/2016   PLT 336.0 11/10/2016   Lab Results  Component Value Date   NA 142 11/10/2016   K 4.2 11/10/2016   CO2 29 11/10/2016   GLUCOSE 111 (H) 11/10/2016   BUN 10 11/10/2016   CREATININE 0.57 11/10/2016   BILITOT 0.4 11/10/2016   ALKPHOS 88 11/10/2016  AST 24 11/10/2016   ALT 25 11/10/2016   PROT 6.3 11/10/2016   ALBUMIN 4.0 11/10/2016   CALCIUM 9.4 11/10/2016   ANIONGAP 8 05/08/2015   GFR 114.58 11/10/2016   Lab Results  Component Value Date   CHOL 305 (H) 11/10/2016   Lab Results  Component Value Date   HDL 37.40 (L) 11/10/2016   No results found for: [facility] Lab Results  Component Value Date   TRIG 313.0 (H) 11/10/2016   Lab Results  Component Value Date   CHOLHDL 8 11/10/2016   Lab Results  Component Value Date   HGBA1C 5.8 (A) 12/19/2019      Assessment & Plan:   #1 recent fall with closed head injury.  CT scan head unremarkable.  Patient has had some mild cognitive impairment but overall improving.  No headaches or  other worrisome symptoms.  -We discussed previously importance of rest both cognitively and physically until fully recovered  #2 high risk for falls.  She has had physical therapy to address this in the past.  She is taken some measures at home to reduce fall risk.  Offered further physical therapy but she declines at this time  #3 history of hyperglycemia.  Positive family history of type 2 diabetes -Recheck A1c -Continue low glycemic diet  #4 high risk medications.  She is on multiple medications per psychiatry including Lamictal and Seroquel states she is not had any labs in well over a year.  We will check CBC and comprehensive metabolic panel.  No orders of the defined types were placed in this encounter.   Follow-up: No follow-ups on file.    Evelena Peat, MD

## 2021-02-18 NOTE — Patient Instructions (Signed)

## 2021-02-20 ENCOUNTER — Encounter: Payer: Self-pay | Admitting: Family Medicine

## 2021-02-20 ENCOUNTER — Other Ambulatory Visit: Payer: Self-pay

## 2021-02-20 DIAGNOSIS — D649 Anemia, unspecified: Secondary | ICD-10-CM

## 2021-03-04 ENCOUNTER — Other Ambulatory Visit: Payer: Self-pay

## 2021-03-04 ENCOUNTER — Other Ambulatory Visit (INDEPENDENT_AMBULATORY_CARE_PROVIDER_SITE_OTHER): Payer: Medicare Other

## 2021-03-04 DIAGNOSIS — D649 Anemia, unspecified: Secondary | ICD-10-CM | POA: Diagnosis not present

## 2021-03-04 LAB — FERRITIN: Ferritin: 5.7 ng/mL — ABNORMAL LOW (ref 10.0–291.0)

## 2021-03-05 ENCOUNTER — Encounter: Payer: Self-pay | Admitting: Family Medicine

## 2021-03-06 LAB — IBC PANEL
Iron: 53 ug/dL (ref 42–145)
Saturation Ratios: 11.2 % — ABNORMAL LOW (ref 20.0–50.0)
Transferrin: 337 mg/dL (ref 212.0–360.0)

## 2021-03-06 MED ORDER — DOXYCYCLINE HYCLATE 100 MG PO TABS: 100.0000 mg | ORAL_TABLET | Freq: Two times a day (BID) | ORAL | 0 refills | Status: DC

## 2021-03-06 NOTE — Telephone Encounter (Signed)
Rx sen tin. Spoke with the patient she is aware that the rx has been sent in and to follow up if symptoms persist.

## 2021-03-09 ENCOUNTER — Other Ambulatory Visit: Payer: Self-pay

## 2021-03-09 DIAGNOSIS — D509 Iron deficiency anemia, unspecified: Secondary | ICD-10-CM

## 2021-03-26 ENCOUNTER — Other Ambulatory Visit: Payer: Self-pay | Admitting: Family Medicine

## 2021-03-26 NOTE — Telephone Encounter (Signed)
Last filled 01/12/2021 Last OV 02/18/2021 Ok to fill?

## 2021-05-11 ENCOUNTER — Inpatient Hospital Stay (HOSPITAL_COMMUNITY)
Admission: EM | Admit: 2021-05-11 | Discharge: 2021-05-15 | DRG: 184 | Disposition: A | Discharge status: Skilled Nursing Facility | Payer: Medicare Other | Attending: Internal Medicine | Admitting: Internal Medicine

## 2021-05-11 ENCOUNTER — Encounter (HOSPITAL_COMMUNITY): Payer: Self-pay

## 2021-05-11 ENCOUNTER — Other Ambulatory Visit: Payer: Self-pay

## 2021-05-11 DIAGNOSIS — Z8261 Family history of arthritis: Secondary | ICD-10-CM

## 2021-05-11 DIAGNOSIS — Z8349 Family history of other endocrine, nutritional and metabolic diseases: Secondary | ICD-10-CM

## 2021-05-11 DIAGNOSIS — Z888 Allergy status to other drugs, medicaments and biological substances status: Secondary | ICD-10-CM

## 2021-05-11 DIAGNOSIS — J90 Pleural effusion, not elsewhere classified: Secondary | ICD-10-CM | POA: Diagnosis present

## 2021-05-11 DIAGNOSIS — G4733 Obstructive sleep apnea (adult) (pediatric): Secondary | ICD-10-CM | POA: Diagnosis present

## 2021-05-11 DIAGNOSIS — Z66 Do not resuscitate: Secondary | ICD-10-CM | POA: Diagnosis present

## 2021-05-11 DIAGNOSIS — Z20822 Contact with and (suspected) exposure to covid-19: Secondary | ICD-10-CM | POA: Diagnosis present

## 2021-05-11 DIAGNOSIS — K449 Diaphragmatic hernia without obstruction or gangrene: Secondary | ICD-10-CM | POA: Diagnosis present

## 2021-05-11 DIAGNOSIS — Z882 Allergy status to sulfonamides status: Secondary | ICD-10-CM

## 2021-05-11 DIAGNOSIS — F32A Depression, unspecified: Secondary | ICD-10-CM | POA: Diagnosis present

## 2021-05-11 DIAGNOSIS — R0602 Shortness of breath: Secondary | ICD-10-CM

## 2021-05-11 DIAGNOSIS — M6250 Muscle wasting and atrophy, not elsewhere classified, unspecified site: Secondary | ICD-10-CM | POA: Diagnosis present

## 2021-05-11 DIAGNOSIS — E785 Hyperlipidemia, unspecified: Secondary | ICD-10-CM | POA: Diagnosis present

## 2021-05-11 DIAGNOSIS — E669 Obesity, unspecified: Secondary | ICD-10-CM | POA: Diagnosis present

## 2021-05-11 DIAGNOSIS — Z880 Allergy status to penicillin: Secondary | ICD-10-CM

## 2021-05-11 DIAGNOSIS — D72829 Elevated white blood cell count, unspecified: Secondary | ICD-10-CM | POA: Diagnosis present

## 2021-05-11 DIAGNOSIS — S2242XA Multiple fractures of ribs, left side, initial encounter for closed fracture: Principal | ICD-10-CM | POA: Diagnosis present

## 2021-05-11 DIAGNOSIS — F39 Unspecified mood [affective] disorder: Secondary | ICD-10-CM

## 2021-05-11 DIAGNOSIS — M797 Fibromyalgia: Secondary | ICD-10-CM | POA: Diagnosis present

## 2021-05-11 DIAGNOSIS — Z6831 Body mass index (BMI) 31.0-31.9, adult: Secondary | ICD-10-CM

## 2021-05-11 DIAGNOSIS — W182XXA Fall in (into) shower or empty bathtub, initial encounter: Secondary | ICD-10-CM | POA: Diagnosis present

## 2021-05-11 DIAGNOSIS — Z79899 Other long term (current) drug therapy: Secondary | ICD-10-CM

## 2021-05-11 DIAGNOSIS — Z9181 History of falling: Secondary | ICD-10-CM

## 2021-05-11 DIAGNOSIS — S2249XA Multiple fractures of ribs, unspecified side, initial encounter for closed fracture: Secondary | ICD-10-CM | POA: Diagnosis present

## 2021-05-11 DIAGNOSIS — W19XXXA Unspecified fall, initial encounter: Secondary | ICD-10-CM | POA: Diagnosis present

## 2021-05-11 DIAGNOSIS — G600 Hereditary motor and sensory neuropathy: Secondary | ICD-10-CM | POA: Diagnosis present

## 2021-05-11 DIAGNOSIS — E039 Hypothyroidism, unspecified: Secondary | ICD-10-CM | POA: Diagnosis present

## 2021-05-11 DIAGNOSIS — R296 Repeated falls: Secondary | ICD-10-CM | POA: Diagnosis present

## 2021-05-11 DIAGNOSIS — Y93E1 Activity, personal bathing and showering: Secondary | ICD-10-CM

## 2021-05-11 DIAGNOSIS — K219 Gastro-esophageal reflux disease without esophagitis: Secondary | ICD-10-CM | POA: Diagnosis present

## 2021-05-11 DIAGNOSIS — Y92031 Bathroom in apartment as the place of occurrence of the external cause: Secondary | ICD-10-CM

## 2021-05-11 DIAGNOSIS — R0902 Hypoxemia: Secondary | ICD-10-CM | POA: Diagnosis present

## 2021-05-11 DIAGNOSIS — Z803 Family history of malignant neoplasm of breast: Secondary | ICD-10-CM

## 2021-05-11 NOTE — ED Notes (Signed)
Pt AxO x4. C/O L sided rib pain. Pain has improved after fentanyl from EMS. VSS. Pt says it's difficult taking a deep breathe. NAD noted.

## 2021-05-11 NOTE — ED Triage Notes (Signed)
Pt was in the shower at 1400 and fell and landed onto L side. L ribs hurting worse and now hard for her to move/breathe. Not on blood thinners, no LOC. Pt has a disease where her muscles are atrophied, so she falls frequently.

## 2021-05-12 ENCOUNTER — Emergency Department (HOSPITAL_COMMUNITY): Payer: Medicare Other

## 2021-05-12 ENCOUNTER — Observation Stay (HOSPITAL_COMMUNITY): Payer: Medicare Other

## 2021-05-12 DIAGNOSIS — G600 Hereditary motor and sensory neuropathy: Secondary | ICD-10-CM

## 2021-05-12 DIAGNOSIS — S2242XA Multiple fractures of ribs, left side, initial encounter for closed fracture: Secondary | ICD-10-CM | POA: Diagnosis not present

## 2021-05-12 DIAGNOSIS — F39 Unspecified mood [affective] disorder: Secondary | ICD-10-CM

## 2021-05-12 DIAGNOSIS — D72829 Elevated white blood cell count, unspecified: Secondary | ICD-10-CM

## 2021-05-12 DIAGNOSIS — S2249XA Multiple fractures of ribs, unspecified side, initial encounter for closed fracture: Secondary | ICD-10-CM | POA: Diagnosis present

## 2021-05-12 LAB — CBC WITH DIFFERENTIAL/PLATELET
Abs Immature Granulocytes: 0.08 10*3/uL — ABNORMAL HIGH (ref 0.00–0.07)
Basophils Absolute: 0 10*3/uL (ref 0.0–0.1)
Basophils Relative: 0 %
Eosinophils Absolute: 0.1 10*3/uL (ref 0.0–0.5)
Eosinophils Relative: 1 %
HCT: 38.8 % (ref 36.0–46.0)
Hemoglobin: 12.1 g/dL (ref 12.0–15.0)
Immature Granulocytes: 1 %
Lymphocytes Relative: 15 %
Lymphs Abs: 2 10*3/uL (ref 0.7–4.0)
MCH: 26.9 pg (ref 26.0–34.0)
MCHC: 31.2 g/dL (ref 30.0–36.0)
MCV: 86.4 fL (ref 80.0–100.0)
Monocytes Absolute: 0.8 10*3/uL (ref 0.1–1.0)
Monocytes Relative: 6 %
Neutro Abs: 11 10*3/uL — ABNORMAL HIGH (ref 1.7–7.7)
Neutrophils Relative %: 77 %
Platelets: 359 10*3/uL (ref 150–400)
RBC: 4.49 MIL/uL (ref 3.87–5.11)
RDW: 16.4 % — ABNORMAL HIGH (ref 11.5–15.5)
WBC: 14.1 10*3/uL — ABNORMAL HIGH (ref 4.0–10.5)
nRBC: 0 % (ref 0.0–0.2)

## 2021-05-12 LAB — CBC
HCT: 41.7 % (ref 36.0–46.0)
Hemoglobin: 12.8 g/dL (ref 12.0–15.0)
MCH: 26.9 pg (ref 26.0–34.0)
MCHC: 30.7 g/dL (ref 30.0–36.0)
MCV: 87.8 fL (ref 80.0–100.0)
Platelets: 330 10*3/uL (ref 150–400)
RBC: 4.75 MIL/uL (ref 3.87–5.11)
RDW: 16.6 % — ABNORMAL HIGH (ref 11.5–15.5)
WBC: 13 10*3/uL — ABNORMAL HIGH (ref 4.0–10.5)
nRBC: 0 % (ref 0.0–0.2)

## 2021-05-12 LAB — BASIC METABOLIC PANEL
Anion gap: 9 (ref 5–15)
BUN: 12 mg/dL (ref 8–23)
CO2: 23 mmol/L (ref 22–32)
Calcium: 8.9 mg/dL (ref 8.9–10.3)
Chloride: 105 mmol/L (ref 98–111)
Creatinine, Ser: 0.74 mg/dL (ref 0.44–1.00)
GFR, Estimated: >60 mL/min (ref >60)
Glucose, Bld: 124 mg/dL — ABNORMAL HIGH (ref 70–99)
Potassium: 3.8 mmol/L (ref 3.5–5.1)
Sodium: 137 mmol/L (ref 135–145)

## 2021-05-12 LAB — RESP PANEL BY RT-PCR (FLU A&B, COVID) ARPGX2
Influenza A by PCR: NEGATIVE
Influenza B by PCR: NEGATIVE
SARS Coronavirus 2 by RT PCR: NEGATIVE

## 2021-05-12 LAB — HIV ANTIBODY (ROUTINE TESTING W REFLEX): HIV Screen 4th Generation wRfx: NONREACTIVE

## 2021-05-12 MED ORDER — FENTANYL CITRATE (PF) 100 MCG/2ML IJ SOLN
50.0000 ug | Freq: Once | INTRAMUSCULAR | Status: AC
Start: 2021-05-12 — End: 2021-05-12
  Administered 2021-05-12: 50 ug via INTRAVENOUS
  Filled 2021-05-12: qty 2

## 2021-05-12 MED ORDER — LAMOTRIGINE 150 MG PO TABS
150.0000 mg | ORAL_TABLET | Freq: Every day | ORAL | Status: DC
  Administered 2021-05-13 – 2021-05-14 (×2): 150 mg via ORAL
  Filled 2021-05-12 (×3): qty 1

## 2021-05-12 MED ORDER — OXCARBAZEPINE 150 MG PO TABS
150.0000 mg | ORAL_TABLET | Freq: Two times a day (BID) | ORAL | Status: DC
  Filled 2021-05-12 (×3): qty 1

## 2021-05-12 MED ORDER — KETOROLAC TROMETHAMINE 30 MG/ML IJ SOLN
30.0000 mg | Freq: Three times a day (TID) | INTRAMUSCULAR | Status: DC | PRN
  Administered 2021-05-12 – 2021-05-13 (×2): 30 mg via INTRAVENOUS
  Filled 2021-05-12 (×3): qty 1

## 2021-05-12 MED ORDER — OXYCODONE-ACETAMINOPHEN 5-325 MG PO TABS
1.0000 | ORAL_TABLET | Freq: Once | ORAL | Status: AC
Start: 2021-05-12 — End: 2021-05-12
  Administered 2021-05-12: 1 via ORAL
  Filled 2021-05-12: qty 1

## 2021-05-12 MED ORDER — QUETIAPINE FUMARATE 50 MG PO TABS
150.0000 mg | ORAL_TABLET | Freq: Every day | ORAL | Status: DC
  Filled 2021-05-12: qty 1

## 2021-05-12 MED ORDER — BUPROPION HCL ER (XL) 300 MG PO TB24
450.0000 mg | ORAL_TABLET | Freq: Every day | ORAL | Status: DC
  Administered 2021-05-13 – 2021-05-14 (×2): 450 mg via ORAL
  Filled 2021-05-12 (×3): qty 1

## 2021-05-12 MED ORDER — GABAPENTIN 400 MG PO CAPS
800.0000 mg | ORAL_CAPSULE | Freq: Four times a day (QID) | ORAL | Status: DC
  Administered 2021-05-12 – 2021-05-15 (×13): 800 mg via ORAL
  Filled 2021-05-12 (×8): qty 2
  Filled 2021-05-12: qty 8
  Filled 2021-05-12 (×8): qty 2

## 2021-05-12 MED ORDER — KETOROLAC TROMETHAMINE 30 MG/ML IJ SOLN
30.0000 mg | Freq: Once | INTRAMUSCULAR | Status: AC
  Administered 2021-05-12: 30 mg via INTRAVENOUS
  Filled 2021-05-12: qty 1

## 2021-05-12 MED ORDER — OXYCODONE-ACETAMINOPHEN 5-325 MG PO TABS
1.0000 | ORAL_TABLET | Freq: Four times a day (QID) | ORAL | Status: DC | PRN
  Administered 2021-05-12: 1 via ORAL
  Administered 2021-05-12: 2 via ORAL
  Administered 2021-05-13 (×2): 1 via ORAL
  Filled 2021-05-12: qty 2
  Filled 2021-05-12 (×3): qty 1

## 2021-05-12 MED ORDER — MORPHINE SULFATE (PF) 2 MG/ML IV SOLN
1.0000 mg | INTRAVENOUS | Status: DC | PRN
Start: 2021-05-12 — End: 2021-05-13
  Administered 2021-05-12 – 2021-05-13 (×4): 1 mg via INTRAVENOUS
  Filled 2021-05-12 (×4): qty 1

## 2021-05-12 MED ORDER — FLUTICASONE PROPIONATE 50 MCG/ACT NA SUSP
2.0000 | Freq: Every day | NASAL | Status: DC
  Administered 2021-05-13 – 2021-05-15 (×3): 2 via NASAL
  Filled 2021-05-12: qty 16

## 2021-05-12 MED ORDER — SENNOSIDES-DOCUSATE SODIUM 8.6-50 MG PO TABS
1.0000 | ORAL_TABLET | Freq: Two times a day (BID) | ORAL | Status: DC
  Administered 2021-05-12 – 2021-05-15 (×6): 1 via ORAL
  Filled 2021-05-12 (×7): qty 1

## 2021-05-12 MED ORDER — LIDOCAINE 5 % EX PTCH
1.0000 | MEDICATED_PATCH | CUTANEOUS | Status: DC
  Administered 2021-05-12 – 2021-05-14 (×3): 1 via TRANSDERMAL
  Filled 2021-05-12 (×3): qty 1

## 2021-05-12 MED ORDER — BACLOFEN 10 MG PO TABS
10.0000 mg | ORAL_TABLET | Freq: Two times a day (BID) | ORAL | Status: DC
  Administered 2021-05-12 – 2021-05-13 (×3): 10 mg via ORAL
  Filled 2021-05-12 (×5): qty 1

## 2021-05-12 MED ORDER — LORATADINE 10 MG PO TABS
10.0000 mg | ORAL_TABLET | Freq: Every day | ORAL | Status: DC
  Administered 2021-05-12 – 2021-05-15 (×4): 10 mg via ORAL
  Filled 2021-05-12 (×4): qty 1

## 2021-05-12 MED ORDER — ONDANSETRON HCL 4 MG/2ML IJ SOLN
4.0000 mg | Freq: Once | INTRAMUSCULAR | Status: AC
  Administered 2021-05-12: 4 mg via INTRAVENOUS
  Filled 2021-05-12: qty 2

## 2021-05-12 MED ORDER — OXCARBAZEPINE 150 MG PO TABS
150.0000 mg | ORAL_TABLET | Freq: Every day | ORAL | Status: DC
  Administered 2021-05-13 – 2021-05-14 (×2): 150 mg via ORAL
  Filled 2021-05-12 (×3): qty 1

## 2021-05-12 MED ORDER — POLYETHYLENE GLYCOL 3350 17 G PO PACK
17.0000 g | PACK | Freq: Every day | ORAL | Status: DC
  Administered 2021-05-12 – 2021-05-15 (×4): 17 g via ORAL
  Filled 2021-05-12 (×4): qty 1

## 2021-05-12 MED ORDER — LAMOTRIGINE 150 MG PO TABS
150.0000 mg | ORAL_TABLET | Freq: Every day | ORAL | Status: DC
  Administered 2021-05-12: 150 mg via ORAL
  Filled 2021-05-12 (×2): qty 1

## 2021-05-12 MED ORDER — FENTANYL CITRATE (PF) 100 MCG/2ML IJ SOLN
50.0000 ug | Freq: Once | INTRAMUSCULAR | Status: AC
  Administered 2021-05-12: 50 ug via INTRAVENOUS
  Filled 2021-05-12: qty 2

## 2021-05-12 MED ORDER — BUPROPION HCL ER (XL) 300 MG PO TB24
450.0000 mg | ORAL_TABLET | Freq: Every day | ORAL | Status: DC
  Administered 2021-05-12: 450 mg via ORAL
  Filled 2021-05-12 (×2): qty 1

## 2021-05-12 MED ORDER — PANTOPRAZOLE SODIUM 40 MG PO TBEC
40.0000 mg | DELAYED_RELEASE_TABLET | Freq: Every day | ORAL | Status: DC
  Administered 2021-05-12 – 2021-05-15 (×4): 40 mg via ORAL
  Filled 2021-05-12 (×4): qty 1

## 2021-05-12 MED ORDER — QUETIAPINE FUMARATE 200 MG PO TABS
200.0000 mg | ORAL_TABLET | Freq: Every day | ORAL | Status: DC
  Administered 2021-05-12 – 2021-05-14 (×3): 200 mg via ORAL
  Filled 2021-05-12: qty 4
  Filled 2021-05-12 (×3): qty 1
  Filled 2021-05-12: qty 4
  Filled 2021-05-12: qty 1
  Filled 2021-05-12: qty 4

## 2021-05-12 NOTE — Progress Notes (Signed)
Occupational Therapy Evaluation  PTA lives alone and is modified independent with use of a cane. Pt reports recent incidence of dizziness related to medication changes and has had 2 falls in the last month. During assessment, pt with increased pain limiting her ability to complete mobility and ADL tasks. Pt lives alone and deficits resulting from fall in combination with her neurological deficits decrease her ability to complete ADL, IADL tasks and mobility at safe level to DC home. Will benefit from rehab at SNF to facilitate safe return home. Pt with increased WOB during session with SpO2 ranging from 88-91 during activity of RA. Would recommend pt consider looking into ALF after rehab.  Will follow acutely.     05/12/21 1500  OT Visit Information  Last OT Received On 05/12/21  Assistance Needed +2  PT/OT/SLP Co-Evaluation/Treatment Yes  Reason for Co-Treatment To address functional/ADL transfers  OT goals addressed during session ADL's and self-care  History of Present Illness Pt is a 65yo female presenting to [facility] ED on 8/8 s/p fall in the shower with left-flank pain. Xray shows L rib 4-6 fractures, no pneuomothorax. PMH: Charcot-Marie-Tooth, ADD, fibromyalgia, osteoporosis, neuropathy, history of falls.  Precautions  Precautions Fall  Home Living  Family/patient expects to be discharged to: Private residence  Living Arrangements Alone  Available Help at Discharge Neighbor;Available PRN/intermittently  Type of Home Apartment  Home Access Stairs to enter  Entrance Stairs-Number of Steps 16 steps with a landing  Entrance Stairs-Rails Left  Home Layout One level  Bathroom Nurse, children's Yes  How Accessible Accessible via walker  Home Equipment Cane - single point;Grab bars - tub/shower;Shower seat;Walker - 2 wheels  Prior Function  Level of Independence Independent with assistive device(s)  Comments Uses cane for mobility  (holds in RUE). Independent with ADLs and IADL  Communication  Communication No difficulties  Pain Assessment  Pain Assessment 0-10  Pain Score 8  Pain Location chest/ribs  Pain Descriptors / Indicators Aching;Discomfort;Grimacing;Stabbing  Pain Intervention(s) Limited activity within patient's tolerance  Cognition  Arousal/Alertness Awake/alert  Behavior During Therapy WFL for tasks assessed/performed  Overall Cognitive Status Within Functional Limits for tasks assessed  Upper Extremity Assessment  Upper Extremity Assessment RUE deficits/detail;LUE deficits/detail  RUE Deficits / Details claw hand deformity with atrophy in hypothenar and thenar areas; limited mobilit of CMC and MP; only able to use lateral pinch  RUE Coordination decreased fine motor  LUE Deficits / Details generalized weakness  Lower Extremity Assessment  Lower Extremity Assessment Defer to PT evaluation  RLE Deficits / Details  (Noted decreased DF of right ankle; pt reports that occasionally she experiences foot drop during gait and stumbles over right toes and that during gait she walks on the lateral surface of the foot)  RLE Sensation history of peripheral neuropathy  LLE Deficits / Details Noted increased pain in ribs when performing heel slide. Hyperesthesia of plantar surface of foot; normal sensation noted at calf.  LLE Sensation history of peripheral neuropathy  Cervical / Trunk Assessment  Cervical / Trunk Assessment Normal  ADL  Overall ADL's  Needs assistance/impaired  Grooming Minimal assistance  Upper Body Bathing Minimal assistance;Sitting  Lower Body Bathing Moderate assistance;Sit to/from stand  Upper Body Dressing  Moderate assistance  Lower Body Dressing Moderate assistance;Sit to/from Scientist, research (life sciences) Minimal assistance;+2 for physical assistance;Stand-pivot;Cueing for safety  Toileting- Clothing Manipulation and Hygiene Sit to/from stand;Minimal assistance  Functional mobility during  ADLs Minimal assistance;+2 for physical  assistance;Rolling walker;Cueing for safety  General ADL Comments pain and balance deficits significantly limiting mobility  Bed Mobility  Overal bed mobility Needs Assistance  Bed Mobility Rolling;Sidelying to Sit;Sit to Sidelying  Rolling Mod assist;+2 for physical assistance;+2 for safety/equipment  Sidelying to sit Mod assist;+2 for physical assistance;+2 for safety/equipment  Sit to sidelying Mod assist;+2 for physical assistance;+2 for safety/equipment  General bed mobility comments Pt required mod assist +2 (advance LEs, roll trunk, pull through UEs). Required multimodal cues for all bed mobility.  Once in sitting, pt demonstrated accessory muscle breathing and SOB; pursed lip breathing technique utilized to help reduce muscle activation and improve respiration.  Transfers  Overall transfer level Needs assistance  Equipment used Rolling walker (2 wheeled)  Transfers Sit to/from [employer]  Sit to [employer] safety/equipment;+2 physical assistance  Stand pivot transfers Min assist;+2 safety/equipment;+2 physical assistance  General transfer comment Pt required min assist +2 for sit to stand transfer for lift assist to acheive standing. Upon standing BLE shaking was noted for several seconds that spontaneously resolved. Pt required min assist +2 for weight shift and steadying to transfer to/from [facility]. Upon standing from bedside commode, BLE shaking was again noted and resolved.  Balance  Overall balance assessment Needs assistance  Sitting-balance support Feet supported  Sitting balance-Leahy Scale Fair  Standing balance support Bilateral upper extremity supported;During functional activity  Standing balance-Leahy Scale Poor  Standing balance comment Pt reliant on BUE support on RW during functional mobility tasks.  Exercises  Exercises Other exercises  Other Exercises  Other Exercises encouraged incentive spirometer  OT  - End of Session  Equipment Utilized During Treatment Gait belt;Rolling walker  Activity Tolerance Patient limited by pain  Patient left in bed;with call bell/phone within reach  Nurse Communication Mobility status  OT Assessment  OT Recommendation/Assessment Patient needs continued OT Services  OT Visit Diagnosis Unsteadiness on feet (R26.81);Muscle weakness (generalized) (M62.81);Other abnormalities of gait and mobility (R26.89);History of falling (Z91.81);Pain;Dizziness and giddiness (R42)  Pain - Right/Left Left  Pain - part of body  (chest/ribs)  OT Problem List Decreased strength;Decreased range of motion;Decreased activity tolerance;Impaired balance (sitting and/or standing);Decreased coordination;Decreased safety awareness;Decreased knowledge of use of DME or AE;Cardiopulmonary status limiting activity;Impaired UE functional use;Pain  Barriers to Discharge Decreased caregiver support  OT Plan  OT Frequency (ACUTE ONLY) Min 2X/week  OT Treatment/Interventions (ACUTE ONLY) Self-care/ADL training;Therapeutic exercise;Energy conservation;DME and/or AE instruction;Therapeutic activities;Patient/family education;Balance training  AM-PAC OT "6 Clicks" Daily Activity Outcome Measure (Version 2)  Help from another person eating meals? 3  Help from another person taking care of personal grooming? 3  Help from another person toileting, which includes using toliet, bedpan, or urinal? 3  Help from another person bathing (including washing, rinsing, drying)? 2  Help from another person to put on and taking off regular upper body clothing? 2  Help from another person to put on and taking off regular lower body clothing? 2  6 Click Score 15  Progressive Mobility  What is the highest level of mobility based on the progressive mobility assessment? Level 3 (Stands with assist) - Balance while standing  and cannot march in place  Mobility Out of bed for toileting;Out of bed to chair with meals  OT  Recommendation  Follow Up Recommendations SNF  OT Equipment None recommended by OT  Individuals Consulted  Consulted and Agree with Results and Recommendations Patient  Acute Rehab OT Goals  Patient Stated Goal to reduce pain  OT Goal Formulation With  patient  Time For Goal Achievement 05/26/21  Potential to Achieve Goals Good  OT Time Calculation  OT Start Time (ACUTE ONLY) 1126  OT Stop Time (ACUTE ONLY) 1207  OT Time Calculation (min) 41 min  OT General Charges  $OT Visit 1 Visit  OT Evaluation  $OT Eval Moderate Complexity 1 Mod  Written Expression  Dominant Hand Right  Luisa Dago, OT/L   Acute OT Clinical Specialist Acute Rehabilitation Services Pager [phone] Office [phone]

## 2021-05-12 NOTE — ED Provider Notes (Signed)
[facility] EMERGENCY DEPARTMENT Provider Note   CSN: [id number] Arrival date & time: 05/11/21  2303     History Chief Complaint  Patient presents with   Alexandra Norman    Alexandra Norman is a 65 y.o. female.  HPI     This is a 65 year old female with a history of Charcot-Marie-Tooth, hyperlipidemia, thyroid disease who presents following a fall.  Patient reports that she slipped and fell in the shower around 2:30 PM.  She reports that she tried to catch herself but her hand was soapy.  She fell back hitting her left posterior chest wall and scapula.  Since that time she has had progressively worsening pain.  No shortness of breath.  Pain improved with fentanyl in route.  4 out of 10.  She states that it is creeping back up.  She has been ambulatory since that time.  She denies hitting her head or loss of consciousness.  She is not on any blood thinners.  Pain is worse when she takes a deep breath.  Of note, patient is DNR.  Past Medical History:  Diagnosis Date   ADD (attention deficit disorder)    Allergy    Arrhythmia    Arthritis    Charcot-Marie-Tooth disease    Depression    Fibromyalgia    Headache(784.0)    Heart murmur    Hyperlipidemia    Osteoarthritis    Osteoporosis    Rotator cuff tear    Thyroid disease    Ulcer    UTI (urinary tract infection)     Patient Active Problem List   Diagnosis Date Noted   Hyperglycemia 07/09/2019   S/P ORIF (open reduction internal fixation) fracture 08/12/2016   Pain, wrist, left 08/09/2016   Constipation 04/29/2015   Encounter to establish care 04/29/2015   Peripheral neuropathy 09/07/2012   Charcot-Marie-Tooth disease 01/31/2012   Hypothyroid 01/31/2012   Atrial septal defect 01/31/2012   Hyperlipidemia 01/31/2012   GERD (gastroesophageal reflux disease) 01/31/2012   Osteoarthritis 01/31/2012   Osteoporosis 01/31/2012   Depression 01/31/2012   ADD (attention deficit disorder) 01/31/2012   Fibromyalgia  01/31/2012   Allergic rhinitis 01/31/2012    Past Surgical History:  Procedure Laterality Date   CARDIAC SURGERY  1982   ASD repair    CARPAL TUNNEL RELEASE  2007   bilateral   CATARACT EXTRACTION, BILATERAL Bilateral    FOOT SURGERY  2001 and 2005   bilateral   HERNIA REPAIR     WRIST SURGERY Left 08/12/2017     OB History   No obstetric history on file.     Family History  Problem Relation Age of Onset   Alcohol abuse Mother    Arthritis Mother    Mental illness Mother        Suicide in 80   Thyroid disease Mother    Alcohol abuse Father    Arthritis Father    Mental illness Sister        Suicide in 2   Breast cancer Sister     Social History   Tobacco Use   Smoking status: Never   Smokeless tobacco: Never  Vaping Use   Vaping Use: Never used  Substance Use Topics   Alcohol use: No    Alcohol/week: 0.0 standard drinks   Drug use: No    Home Medications Prior to Admission medications   Medication Sig Start Date End Date Taking? Authorizing Provider  aspirin 325 MG tablet Take 975 mg by mouth every  6 (six) hours as needed for mild pain or headache.   Yes [provider]  Aspirin-Caffeine (BAYER BACK & BODY PO) Take 2 tablets by mouth daily as needed (pain).   Yes [provider]  baclofen (LIORESAL) 10 MG tablet Take 1 tablet (10 mg total) by mouth 2 (two) times daily. 11/17/20  Yes Burchette, Elberta FortisBruce W, MD  buPROPion (WELLBUTRIN XL) 150 MG 24 hr tablet Take 450 mg by mouth daily. 12/28/20  Yes [provider]  calcium carbonate (OSCAL) 1500 (600 Ca) MG TABS tablet Take 1,500 mg by mouth daily with breakfast.   Yes [provider]  Coenzyme Q10 (CO Q10) 100 MG CAPS Take 100 mg by mouth daily.   Yes [provider]  Cranberry 400 MG CAPS Take 400 mg by mouth daily.   Yes [provider]  esomeprazole (NEXIUM) 40 MG capsule Take 1 capsule (40 mg total) by mouth daily before breakfast. 11/30/12  Yes  Burchette, Elberta FortisBruce W, MD  fluticasone (FLONASE) 50 MCG/ACT nasal spray SPRAY 2 SPRAYS INTO EACH NOSTRIL EVERY DAY Patient taking differently: Place 2 sprays into both nostrils daily. 08/15/17  Yes Burchette, Elberta FortisBruce W, MD  gabapentin (NEURONTIN) 800 MG tablet TAKE 1 TABLET BY MOUTH FOUR TIMES A DAY Patient taking differently: Take 800 mg by mouth 4 (four) times daily. 01/12/21  Yes Burchette, Elberta FortisBruce W, MD  lamoTRIgine (LAMICTAL) 150 MG tablet Take 150 mg by mouth daily.  10/18/19  Yes [provider]  loratadine (CLARITIN) 10 MG tablet Take 10 mg by mouth daily.   Yes [provider]  Multiple Vitamins-Minerals (MULTIVITAMIN & MINERAL PO) Take 1 tablet by mouth daily.   Yes [provider]  naproxen sodium (ALEVE) 220 MG tablet Take 220 mg by mouth daily as needed (pain).   Yes [provider]  Nutritional Supplements (ADULT GROWTH HORMONE SUPPORT PO) Take 1 capsule by mouth daily.    Yes [provider]  ondansetron (ZOFRAN) 4 MG tablet Take 4 mg by mouth every 8 (eight) hours as needed for vomiting or nausea. 11/01/16  Yes [provider]  OXcarbazepine (TRILEPTAL) 150 MG tablet Take 150 mg by mouth 2 (two) times daily.   Yes [provider]  Probiotic Product (PROBIOTIC COLON SUPPORT) CAPS Take 1 capsule by mouth daily.   Yes [provider]  QUEtiapine (SEROQUEL) 50 MG tablet Take 150 mg by mouth at bedtime.    Yes [provider]  traMADol (ULTRAM) 50 MG tablet TAKE 2 TABLETS BY MOUTH TWICE A DAY Patient taking differently: Take 100 mg by mouth 2 (two) times daily. 03/28/21  Yes Burchette, Elberta FortisBruce W, MD  vitamin C (ASCORBIC ACID) 500 MG tablet Take 500 mg by mouth daily.   Yes [provider]  VITAMIN D, CHOLECALCIFEROL, PO Take 2,000 Units by mouth daily.    Yes [provider]  diclofenac sodium (VOLTAREN) 1 % GEL Apply up to 4 times per day as needed for pain. Patient not taking: No sig reported 11/24/15    Kristian CoveyBurchette, Bruce W, MD  doxycycline (VIBRA-TABS) 100 MG tablet Take 1 tablet (100 mg total) by mouth 2 (two) times daily. Patient not taking: No sig reported 03/06/21   Kristian CoveyBurchette, Bruce W, MD  triamcinolone cream (KENALOG) 0.1 % APPLY TO AFFECTED AREA EVERY DAY Patient not taking: No sig reported 03/02/17   Kristian CoveyBurchette, Bruce W, MD    Allergies    Penicillins, Statins, and Sulfa antibiotics  Review of Systems   Review  of Systems  Respiratory:  Negative for shortness of breath.   Cardiovascular:  Positive for chest pain.  Gastrointestinal:  Negative for abdominal pain, nausea and vomiting.  Neurological:  Negative for dizziness and light-headedness.  All other systems reviewed and are negative.  Physical Exam Updated Vital Signs BP (!) 148/74   Pulse 77   Temp 99.1 F (37.3 C) (Oral)   Resp 19   Ht 1.6 m (5\' 3" )   Wt 81.6 kg   SpO2 96%   BMI 31.89 kg/m   Physical Exam Vitals and nursing note reviewed.  Constitutional:      Appearance: She is well-developed. She is not ill-appearing.     Comments: ABCs intact  HENT:     Head: Normocephalic and atraumatic.     Nose: Nose normal.     Mouth/Throat:     Mouth: Mucous membranes are moist.  Eyes:     Pupils: Pupils are equal, round, and reactive to light.  Neck:     Comments: No midline C-spine tenderness to palpation, step-off, deformity Cardiovascular:     Rate and Rhythm: Normal rate and regular rhythm.     Heart sounds: Normal heart sounds.  Pulmonary:     Effort: Pulmonary effort is normal. No respiratory distress.     Breath sounds: No wheezing.     Comments: Left chest wall tenderness palpation, no overlying skin changes, no crepitus Chest:     Chest wall: Tenderness present.  Abdominal:     General: Bowel sounds are normal.     Palpations: Abdomen is soft.  Musculoskeletal:        General: No deformity.     Cervical back: Normal range of motion and neck supple.     Right lower leg: No edema.     Left lower  leg: No edema.  Skin:    General: Skin is warm and dry.  Neurological:     Mental Status: She is alert and oriented to person, place, and time.  Psychiatric:        Mood and Affect: Mood normal.    ED Results / Procedures / Treatments   Labs (all labs ordered are listed, but only abnormal results are displayed) Labs Reviewed  CBC WITH DIFFERENTIAL/PLATELET - Abnormal; Notable for the following components:      Result Value   WBC 14.1 (*)    RDW 16.4 (*)    Neutro Abs 11.0 (*)    Abs Immature Granulocytes 0.08 (*)    All other components within normal limits  BASIC METABOLIC PANEL - Abnormal; Notable for the following components:   Glucose, Bld 124 (*)    All other components within normal limits  RESP PANEL BY RT-PCR (FLU A&B, COVID) [id number]    EKG None  Radiology DG Ribs Unilateral W/Chest Left  Result Date: 05/12/2021 CLINICAL DATA:  Fall EXAM: LEFT RIBS AND CHEST - 3+ VIEW COMPARISON:  None. FINDINGS: Fractures through the left lateral 4th through 6th ribs. No visible pneumothorax. Cardiomegaly. Prior median sternotomy. Suspect hiatal hernia. Bibasilar atelectasis. No visible effusions. IMPRESSION: Left lateral 4th through 6th rib fractures.  No pneumothorax. Cardiomegaly. Hiatal hernia. Bibasilar atelectasis. Electronically Signed   By: 07/12/2021 M.D.   On: 05/12/2021 01:11    Procedures Procedures   Medications Ordered in ED Medications  ketorolac (TORADOL) 30 MG/ML injection 30 mg (has no administration in time range)  fentaNYL (SUBLIMAZE) injection 50 mcg (50 mcg Intravenous Given 05/12/21 0056)  ondansetron (ZOFRAN) injection 4 mg (  4 mg Intravenous Given 05/12/21 0119)  oxyCODONE-acetaminophen (PERCOCET/ROXICET) 5-325 MG per tablet 1 tablet (1 tablet Oral Given 05/12/21 0152)  fentaNYL (SUBLIMAZE) injection 50 mcg (50 mcg Intravenous Given 05/12/21 0157)    ED Course  I have reviewed the triage vital signs and the nursing notes.  Pertinent labs & imaging results that  were available during my care of the patient were reviewed by me and considered in my medical decision making (see chart for details).    MDM Rules/Calculators/A&P                           Patient presents following a fall.  She is nontoxic-appearing.  She is in no respiratory distress.  She is on 2 L of oxygen which is new for her.  She has some splinting.  There is significant tenderness palpation over the left chest wall.  No crepitus or overlying skin changes although exam is limited as patient has difficulty mobilizing for me to fully view.  I cleared her C-spine at the bedside.  No other obvious external signs of trauma.  X-rays obtained.  Patient given pain medication.  X-rays independently reviewed by myself.  Patient with 4 through 6 rib fractures on the left.  Likely the cause of the patient's pain.  Basic lab work with slight leukocytosis.  No significant metabolic derangements.  Given oxygen requirement and mortality associated with 3 rib fractures, she will need admission for pulmonary toilet and pain control.  Briefly discussed with trauma surgery, Dr. Derrell Lolling.  Feels medicine admission is appropriate.  If medicine would like formal trauma consult, he is happy to do this.  I ordered an incentive spirometer.  Given good renal function, she was also given Toradol.  Will discuss with admitting hospitalist.  Final Clinical Impression(s) / ED Diagnoses Final diagnoses:  Closed fracture of multiple ribs of left side, initial encounter  Hypoxia    Rx / DC Orders ED Discharge Orders     None        Shon Baton, MD 05/12/21 [phone]

## 2021-05-12 NOTE — ED Notes (Signed)
Pt says she's in too much pain to sit up and take percocet and wants to know if she can have more IV medication. Dr. Wilkie Aye notified.

## 2021-05-12 NOTE — Plan of Care (Signed)

## 2021-05-12 NOTE — H&P (Signed)
History and Physical    Alexandra Norman HBZ:[id number] DOB: August 03, 1956 DOA: 05/11/2021  PCP: Kristian Covey, MD Patient coming from: Home  Chief Complaint: Fall  HPI: Alexandra Norman is a 65 y.o. female with medical history significant of Charcot-Marie-Tooth disease, frequent falls, hyperlipidemia, thyroid disease, depression, fibromyalgia presenting to the ED complaining of left-sided rib pain after she slipped and fell in her shower tonight.  Splinting and placed on 2 L supplemental oxygen for comfort.  Basic labs showing slight leukocytosis.  No significant metabolic derangements.  Screening COVID test pending.  X-ray showing left lateral 4th through 6th rib fractures, no pneumothorax.  ED physician discussed the case with Dr. Derrell Lolling from trauma surgery who requested admission by medicine service.  Patient was given fentanyl, Toradol, Zofran, and Percocet.  Patient states tonight while taking a shower she slipped and fell in her tub hitting the left side of her chest at the side of the tub.  Since then she is having severe pain in her left lateral ribs.  Denies head injury or loss of consciousness.  No other pain.  Reports history of frequent falls secondary to Charcot-Marie-Tooth disease.  She received physical therapy for 8 weeks last year.  Denies fevers, cough, shortness of breath, chest pain, nausea, vomiting, abdominal pain, diarrhea, or dysuria.  Review of Systems:  All systems reviewed and apart from history of presenting illness, are negative.  Past Medical History:  Diagnosis Date   ADD (attention deficit disorder)    Allergy    Arrhythmia    Arthritis    Charcot-Marie-Tooth disease    Depression    Fibromyalgia    Headache(784.0)    Heart murmur    Hyperlipidemia    Osteoarthritis    Osteoporosis    Rotator cuff tear    Thyroid disease    Ulcer    UTI (urinary tract infection)     Past Surgical History:  Procedure Laterality Date   CARDIAC SURGERY  1982   ASD  repair    CARPAL TUNNEL RELEASE  2007   bilateral   CATARACT EXTRACTION, BILATERAL Bilateral    FOOT SURGERY  2001 and 2005   bilateral   HERNIA REPAIR     WRIST SURGERY Left 08/12/2017     reports that she has never smoked. She has never used smokeless tobacco. She reports that she does not drink alcohol and does not use drugs.  Allergies  Allergen Reactions   Penicillins     hives   Statins    Sulfa Antibiotics     Family History  Problem Relation Age of Onset   Alcohol abuse Mother    Arthritis Mother    Mental illness Mother        Suicide in 21   Thyroid disease Mother    Alcohol abuse Father    Arthritis Father    Mental illness Sister        Suicide in 1990   Breast cancer Sister     Prior to Admission medications   Medication Sig Start Date End Date Taking? Authorizing Provider  aspirin 325 MG tablet Take 975 mg by mouth every 6 (six) hours as needed for mild pain or headache.   Yes [provider]  Aspirin-Caffeine (BAYER BACK & BODY PO) Take 2 tablets by mouth daily as needed (pain).   Yes [provider]  baclofen (LIORESAL) 10 MG tablet Take 1 tablet (10 mg total) by mouth 2 (two) times daily. 11/17/20  Yes Burchette, Bruce  W, MD  buPROPion (WELLBUTRIN XL) 150 MG 24 hr tablet Take 450 mg by mouth daily. 12/28/20  Yes [provider]  calcium carbonate (OSCAL) 1500 (600 Ca) MG TABS tablet Take 1,500 mg by mouth daily with breakfast.   Yes [provider]  Coenzyme Q10 (CO Q10) 100 MG CAPS Take 100 mg by mouth daily.   Yes [provider]  Cranberry 400 MG CAPS Take 400 mg by mouth daily.   Yes [provider]  esomeprazole (NEXIUM) 40 MG capsule Take 1 capsule (40 mg total) by mouth daily before breakfast. 11/30/12  Yes Burchette, Elberta Fortis, MD  fluticasone (FLONASE) 50 MCG/ACT nasal spray SPRAY 2 SPRAYS INTO EACH NOSTRIL EVERY DAY Patient taking differently: Place 2 sprays into both nostrils daily. 08/15/17   Yes Burchette, Elberta Fortis, MD  gabapentin (NEURONTIN) 800 MG tablet TAKE 1 TABLET BY MOUTH FOUR TIMES A DAY Patient taking differently: Take 800 mg by mouth 4 (four) times daily. 01/12/21  Yes Burchette, Elberta Fortis, MD  lamoTRIgine (LAMICTAL) 150 MG tablet Take 150 mg by mouth daily.  10/18/19  Yes [provider]  loratadine (CLARITIN) 10 MG tablet Take 10 mg by mouth daily.   Yes [provider]  Multiple Vitamins-Minerals (MULTIVITAMIN & MINERAL PO) Take 1 tablet by mouth daily.   Yes [provider]  naproxen sodium (ALEVE) 220 MG tablet Take 220 mg by mouth daily as needed (pain).   Yes [provider]  Nutritional Supplements (ADULT GROWTH HORMONE SUPPORT PO) Take 1 capsule by mouth daily.    Yes [provider]  ondansetron (ZOFRAN) 4 MG tablet Take 4 mg by mouth every 8 (eight) hours as needed for vomiting or nausea. 11/01/16  Yes [provider]  OXcarbazepine (TRILEPTAL) 150 MG tablet Take 150 mg by mouth 2 (two) times daily.   Yes [provider]  Probiotic Product (PROBIOTIC COLON SUPPORT) CAPS Take 1 capsule by mouth daily.   Yes [provider]  QUEtiapine (SEROQUEL) 50 MG tablet Take 150 mg by mouth at bedtime.    Yes [provider]  traMADol (ULTRAM) 50 MG tablet TAKE 2 TABLETS BY MOUTH TWICE A DAY Patient taking differently: Take 100 mg by mouth 2 (two) times daily. 03/28/21  Yes Burchette, Elberta Fortis, MD  vitamin C (ASCORBIC ACID) 500 MG tablet Take 500 mg by mouth daily.   Yes [provider]  VITAMIN D, CHOLECALCIFEROL, PO Take 2,000 Units by mouth daily.    Yes [provider]  diclofenac sodium (VOLTAREN) 1 % GEL Apply up to 4 times per day as needed for pain. Patient not taking: No sig reported 11/24/15   Kristian Covey, MD  doxycycline (VIBRA-TABS) 100 MG tablet Take 1 tablet (100 mg total) by mouth 2 (two) times daily. Patient not taking: No sig reported 03/06/21   Kristian Covey,  MD  triamcinolone cream (KENALOG) 0.1 % APPLY TO AFFECTED AREA EVERY DAY Patient not taking: No sig reported 03/02/17   Kristian Covey, MD    Physical Exam: Vitals:   05/11/21 2323 05/12/21 0015 05/12/21 0120 05/12/21 0300  BP:  (!) 147/75 (!) 148/74 (!) 144/68  Pulse:  76 77 83  Resp:  15 19 (!) 25  Temp:      TempSrc:      SpO2:  96% 96% 92%  Weight: 81.6 kg     Height: 5\' 3"  (1.6 m)       Physical Exam Constitutional:  General: She is not in acute distress. HENT:     Head: Normocephalic and atraumatic.  Eyes:     Extraocular Movements: Extraocular movements intact.     Conjunctiva/sclera: Conjunctivae normal.  Cardiovascular:     Rate and Rhythm: Normal rate and regular rhythm.     Pulses: Normal pulses.  Pulmonary:     Effort: Pulmonary effort is normal. No respiratory distress.     Breath sounds: Normal breath sounds. No wheezing or rales.     Comments: Setting 97% on 2 L supplemental oxygen Abdominal:     General: Bowel sounds are normal. There is no distension.     Palpations: Abdomen is soft.     Tenderness: There is no abdominal tenderness.  Musculoskeletal:        General: No swelling or tenderness.     Cervical back: Normal range of motion and neck supple.  Skin:    General: Skin is warm and dry.  Neurological:     Mental Status: She is alert and oriented to person, place, and time.     Labs on Admission: I have personally reviewed following labs and imaging studies  CBC: Recent Labs  Lab 05/12/21 0050  WBC 14.1*  NEUTROABS 11.0*  HGB 12.1  HCT 38.8  MCV 86.4  PLT 359   Basic Metabolic Panel: Recent Labs  Lab 05/12/21 0050  NA 137  K 3.8  CL 105  CO2 23  GLUCOSE 124*  BUN 12  CREATININE 0.74  CALCIUM 8.9   GFR: Estimated Creatinine Clearance: 70.9 mL/min (by C-G formula based on SCr of 0.74 mg/dL). Liver Function Tests: No results for input(s): AST, ALT, ALKPHOS, BILITOT, PROT, ALBUMIN in the last 168 hours. No results for  input(s): LIPASE, AMYLASE in the last 168 hours. No results for input(s): AMMONIA in the last 168 hours. Coagulation Profile: No results for input(s): INR, PROTIME in the last 168 hours. Cardiac Enzymes: No results for input(s): CKTOTAL, CKMB, CKMBINDEX, TROPONINI in the last 168 hours. BNP (last 3 results) No results for input(s): PROBNP in the last 8760 hours. HbA1C: No results for input(s): HGBA1C in the last 72 hours. CBG: No results for input(s): GLUCAP in the last 168 hours. Lipid Profile: No results for input(s): CHOL, HDL, LDLCALC, TRIG, CHOLHDL, LDLDIRECT in the last 72 hours. Thyroid Function Tests: No results for input(s): TSH, T4TOTAL, FREET4, T3FREE, THYROIDAB in the last 72 hours. Anemia Panel: No results for input(s): VITAMINB12, FOLATE, FERRITIN, TIBC, IRON, RETICCTPCT in the last 72 hours. Urine analysis:    Component Value Date/Time   BILIRUBINUR n 10/01/2013 1616   PROTEINUR n 10/01/2013 1616   UROBILINOGEN 0.2 10/01/2013 1616   NITRITE n 10/01/2013 1616   LEUKOCYTESUR Trace 10/01/2013 1616    Radiological Exams on Admission: DG Ribs Unilateral W/Chest Left  Result Date: 05/12/2021 CLINICAL DATA:  Fall EXAM: LEFT RIBS AND CHEST - 3+ VIEW COMPARISON:  None. FINDINGS: Fractures through the left lateral 4th through 6th ribs. No visible pneumothorax. Cardiomegaly. Prior median sternotomy. Suspect hiatal hernia. Bibasilar atelectasis. No visible effusions. IMPRESSION: Left lateral 4th through 6th rib fractures.  No pneumothorax. Cardiomegaly. Hiatal hernia. Bibasilar atelectasis. Electronically Signed   By: Charlett NoseKevin  Dover M.D.   On: 05/12/2021 01:11    EKG: Independently reviewed.  Sinus rhythm, no acute changes.  Assessment/Plan Principal Problem:   Rib fractures Active Problems:   Charcot-Marie-Tooth disease   GERD (gastroesophageal reflux disease)   Leukocytosis   Mood disorder (HCC)   Left lateral 4th  through 6th rib fractures Secondary to mechanical fall.   No pneumothorax on x-ray.  Patient was splinting and placed on 2 L supplemental oxygen for comfort.  Received fentanyl, Toradol, and Percocet in the ED but still endorsing severe pain. -Pain control: Morphine as needed, Percocet as needed, Toradol as needed.  Continue home baclofen and gabapentin.  Incentive spirometry, PT/OT, fall precautions  Mild leukocytosis Likely reactive.  No infectious signs or symptoms. -Repeat CBC in a.m.  Charcot-Marie-Tooth disease -Continue home medications  Mood disorder -Continue home medications  GERD -Continue PPI  DVT prophylaxis: SCDs Code Status: Patient wishes to be DNR. Family Communication: No family available at this time. Disposition Plan: Status is: Observation  The patient remains OBS appropriate and will d/c before 2 midnights.  Dispo: The patient is from: Home              Anticipated d/c is to: Home              Patient currently is not medically stable to d/c.   Difficult to place patient No  Level of care: Level of care: Med-Surg  The medical decision making on this patient was of high complexity and the patient is at high risk for clinical deterioration, therefore this is a level 3 visit.  John Giovanni MD [facility]  If 7PM-7AM, please contact night-coverage [email or web address]  05/12/2021, 3:08 AM

## 2021-05-12 NOTE — Progress Notes (Addendum)
Patient is admitted this am, detailed please see HPI,  She has a H/o charcot-Marie-tooth with right side muscle atrophy presented with mechanical fall, found to have rib fractures, also c/o back pain She Continues to c/o pain, add on lidocaine patch, continue prn iv morphine, add on stool regimen She also reports feeling sob, reports oxygen supplement helped, she is using incentive spirometer, will get cxr .  Will need snf once pain is well controlled.

## 2021-05-12 NOTE — ED Notes (Signed)
Breakfast Orders Placed °

## 2021-05-12 NOTE — ED Notes (Signed)
MD at bedside. 

## 2021-05-12 NOTE — ED Notes (Signed)
Pt says she feels like she broke another rib. She's concerned because she feels like her chest is rattling. Pt appears very anxious and is breathing from her lower throat heavily. I tried to listen to her L lung sounds, but there's a lot of noise because of her breathing and because of her dress. ER doctor notified, she said she's not concerned and to give her the toradol. I told her I did. No new orders.

## 2021-05-12 NOTE — ED Notes (Signed)
Pt c/o nausea. Dr. Wilkie Aye notified.

## 2021-05-12 NOTE — ED Notes (Signed)
Patient transported to X-ray 

## 2021-05-12 NOTE — Progress Notes (Signed)
Physical Therapy Evaluation Patient Details Name: Alexandra Norman MRN: [id number] DOB: 12-Oct-1955 Today's Date: 05/12/2021   History of Present Illness  Pt is a 65yo female presenting to [facility] ED on 8/8 s/p fall in the shower with left-flank pain. Xray shows L rib 4-6 fractures, no pneuomothorax. PMH: Charcot-Marie-Tooth, ADD, fibromyalgia, osteoporosis, neuropathy, history of falls.  Clinical Impression  Pt presents with the impairments and problems above. Pt required mod assist+2 for all bed mobility tasks and was able to transfer to [facility] with min assist+2. Noted shaking of BLE on standing that spontaneously resolved. Expressed high levels of pain throughout session. Pt lives alone in [place] apartment, neighbor can check in on her intermittently. Recommending SNF-level therapies at discharge to address current impairments. We will follow her acutely to promote independence with functional mobility.    Follow Up Recommendations SNF;Supervision for mobility/OOB    Equipment Recommendations  Rolling walker with 5" wheels;3in1 (PT)    Recommendations for Other Services       Precautions / Restrictions Precautions Precautions: Fall Restrictions Weight Bearing Restrictions: No      Mobility  Bed Mobility Overal bed mobility: Needs Assistance Bed Mobility: Rolling;Sidelying to Sit;Sit to Sidelying Rolling: Mod assist;+2 for physical assistance;+2 for safety/equipment Sidelying to sit: Mod assist;+2 for physical assistance;+2 for safety/equipment     Sit to sidelying: Mod assist;+2 for physical assistance;+2 for safety/equipment General bed mobility comments: Pt required mod assist +2 (advance LEs, roll trunk, pull through UEs). Required multimodal cues for all bed mobility.  Once in sitting, pt demonstrated accessory muscle breathing and SOB; pursed lip breathing technique utilized to help reduce muscle activation and improve respiration.    Transfers Overall transfer level: Needs  assistance Equipment used: Rolling walker (2 wheeled) Transfers: Sit to/from [employer] Sit to Stand: Min assist;+2 safety/equipment;+2 physical assistance Stand pivot transfers: Min assist;+2 safety/equipment;+2 physical assistance       General transfer comment: Pt required min assist +2 for sit to stand transfer for lift assist to acheive standing. Upon standing BLE shaking was noted for several seconds that spontaneously resolved. Pt required min assist +2 for weight shift and steadying to transfer to/from [facility]. Upon standing from bedside commode, BLE shaking was again noted and resolved.  Ambulation/Gait                Stairs            Wheelchair Mobility    Modified Rankin (Stroke Patients Only)       Balance Overall balance assessment: Needs assistance Sitting-balance support: Feet supported Sitting balance-Leahy Scale: Fair     Standing balance support: Bilateral upper extremity supported;During functional activity Standing balance-Leahy Scale: Poor Standing balance comment: Pt reliant on BUE support on RW during functional mobility tasks.                             Pertinent Vitals/Pain Pain Assessment: 0-10 Pain Score: 8  Pain Location: chest/ribs Pain Descriptors / Indicators: Aching;Discomfort;Grimacing;Stabbing Pain Intervention(s): Limited activity within patient's tolerance;Repositioned;Utilized relaxation techniques    Home Living Family/patient expects to be discharged to:: Private residence Living Arrangements: Alone Available Help at Discharge: Neighbor;Available PRN/intermittently Type of Home: Apartment Home Access: Stairs to enter Entrance Stairs-Rails: Left Entrance Stairs-Number of Steps: 16 steps with a landing Home Layout: One level Home Equipment: Cane - single point;Grab bars - tub/shower;Shower seat;Walker - 2 wheels      Prior Function Level of Independence: Independent with  assistive device(s)          Comments: Uses cane for mobility (holds in RUE). Independent with ADLs.     Hand Dominance   Dominant Hand: Right    Extremity/Trunk Assessment   Upper Extremity Assessment Upper Extremity Assessment: Defer to OT evaluation RUE Deficits / Details: claw hand deformity with atrophy in hypothenar adnthenar areas    Lower Extremity Assessment Lower Extremity Assessment: Generalized weakness;RLE deficits/detail;LLE deficits/detail RLE Deficits / Details: R ankle with noted high arch deformity and inverted position at rest. Noted decreased DF of right ankle; pt reports that occasionally she experiences foot drop during gait and stumbles over right toes and that during gait she walks on the lateral surface of the foot. Noted hyperesthesia of plantar surface of foot; normal sensation noted at calf. RLE Sensation: history of peripheral neuropathy LLE Deficits / Details: Noted increased pain in ribs when performing heel slide. Hyperesthesia of plantar surface of foot; normal sensation noted at calf. LLE Sensation: history of peripheral neuropathy    Cervical / Trunk Assessment Cervical / Trunk Assessment: Normal  Communication   Communication: No difficulties  Cognition Arousal/Alertness: Awake/alert Behavior During Therapy: WFL for tasks assessed/performed Overall Cognitive Status: Within Functional Limits for tasks assessed                                        General Comments General comments (skin integrity, edema, etc.): Pt pain-focused during session.    Exercises     Assessment/Plan    PT Assessment Patient needs continued PT services  PT Problem List Decreased strength;Decreased range of motion;Decreased activity tolerance;Decreased balance;Decreased mobility;Decreased safety awareness;Pain       PT Treatment Interventions DME instruction;Gait training;Stair training;Functional mobility training;Therapeutic activities;Therapeutic  exercise;Balance training;Patient/family education    PT Goals (Current goals can be found in the Care Plan section)  Acute Rehab PT Goals Patient Stated Goal: to reduce pain PT Goal Formulation: With patient Time For Goal Achievement: 05/26/21 Potential to Achieve Goals: Good    Frequency Min 2X/week   Barriers to discharge Inaccessible home environment;Decreased caregiver support      Co-evaluation PT/OT/SLP Co-Evaluation/Treatment: Yes Reason for Co-Treatment: To address functional/ADL transfers;For patient/therapist safety PT goals addressed during session: Mobility/safety with mobility;Proper use of DME         AM-PAC PT "6 Clicks" Mobility  Outcome Measure Help needed turning from your back to your side while in a flat bed without using bedrails?: A Lot Help needed moving from lying on your back to sitting on the side of a flat bed without using bedrails?: A Lot Help needed moving to and from a bed to a chair (including a wheelchair)?: A Lot Help needed standing up from a chair using your arms (e.g., wheelchair or bedside chair)?: A Lot Help needed to walk in hospital room?: A Lot Help needed climbing 3-5 steps with a railing? : Total 6 Click Score: 11    End of Session Equipment Utilized During Treatment: Gait belt Activity Tolerance: Patient limited by pain Patient left: in bed;with call bell/phone within reach (on stretcher in ED) Nurse Communication: Mobility status PT Visit Diagnosis: Pain;Difficulty in walking, not elsewhere classified (R26.2);History of falling (Z91.81);Muscle weakness (generalized) (M62.81);Unsteadiness on feet (R26.81) Pain - Right/Left: Left Pain - part of body:  (Ribcage)    Time: 0258-5277 PT Time Calculation (min) (ACUTE ONLY): 41 min   Charges:   PT  Evaluation $PT Eval Moderate Complexity: 1 Mod PT Treatments $Therapeutic Activity: 8-22 mins        Johnn Hai, SPT  Johnn Hai 05/12/2021, 1:50 PM

## 2021-05-13 ENCOUNTER — Inpatient Hospital Stay (HOSPITAL_COMMUNITY): Payer: Medicare Other

## 2021-05-13 DIAGNOSIS — W182XXA Fall in (into) shower or empty bathtub, initial encounter: Secondary | ICD-10-CM | POA: Diagnosis present

## 2021-05-13 DIAGNOSIS — S2249XA Multiple fractures of ribs, unspecified side, initial encounter for closed fracture: Secondary | ICD-10-CM | POA: Diagnosis present

## 2021-05-13 DIAGNOSIS — S2242XA Multiple fractures of ribs, left side, initial encounter for closed fracture: Secondary | ICD-10-CM | POA: Diagnosis present

## 2021-05-13 DIAGNOSIS — Y93E1 Activity, personal bathing and showering: Secondary | ICD-10-CM | POA: Diagnosis not present

## 2021-05-13 DIAGNOSIS — Z880 Allergy status to penicillin: Secondary | ICD-10-CM | POA: Diagnosis not present

## 2021-05-13 DIAGNOSIS — E785 Hyperlipidemia, unspecified: Secondary | ICD-10-CM | POA: Diagnosis present

## 2021-05-13 DIAGNOSIS — Y92031 Bathroom in apartment as the place of occurrence of the external cause: Secondary | ICD-10-CM | POA: Diagnosis not present

## 2021-05-13 DIAGNOSIS — Z66 Do not resuscitate: Secondary | ICD-10-CM | POA: Diagnosis present

## 2021-05-13 DIAGNOSIS — G4733 Obstructive sleep apnea (adult) (pediatric): Secondary | ICD-10-CM | POA: Diagnosis present

## 2021-05-13 DIAGNOSIS — J90 Pleural effusion, not elsewhere classified: Secondary | ICD-10-CM | POA: Diagnosis present

## 2021-05-13 DIAGNOSIS — E669 Obesity, unspecified: Secondary | ICD-10-CM | POA: Diagnosis present

## 2021-05-13 DIAGNOSIS — S299XXA Unspecified injury of thorax, initial encounter: Secondary | ICD-10-CM | POA: Diagnosis present

## 2021-05-13 DIAGNOSIS — Z6831 Body mass index (BMI) 31.0-31.9, adult: Secondary | ICD-10-CM | POA: Diagnosis not present

## 2021-05-13 DIAGNOSIS — G600 Hereditary motor and sensory neuropathy: Secondary | ICD-10-CM | POA: Diagnosis present

## 2021-05-13 DIAGNOSIS — D72829 Elevated white blood cell count, unspecified: Secondary | ICD-10-CM | POA: Diagnosis present

## 2021-05-13 DIAGNOSIS — Z8261 Family history of arthritis: Secondary | ICD-10-CM | POA: Diagnosis not present

## 2021-05-13 DIAGNOSIS — R296 Repeated falls: Secondary | ICD-10-CM | POA: Diagnosis present

## 2021-05-13 DIAGNOSIS — E039 Hypothyroidism, unspecified: Secondary | ICD-10-CM | POA: Diagnosis present

## 2021-05-13 DIAGNOSIS — W19XXXA Unspecified fall, initial encounter: Secondary | ICD-10-CM | POA: Diagnosis present

## 2021-05-13 DIAGNOSIS — Z8349 Family history of other endocrine, nutritional and metabolic diseases: Secondary | ICD-10-CM | POA: Diagnosis not present

## 2021-05-13 DIAGNOSIS — Z803 Family history of malignant neoplasm of breast: Secondary | ICD-10-CM | POA: Diagnosis not present

## 2021-05-13 DIAGNOSIS — M797 Fibromyalgia: Secondary | ICD-10-CM | POA: Diagnosis present

## 2021-05-13 DIAGNOSIS — Z79899 Other long term (current) drug therapy: Secondary | ICD-10-CM | POA: Diagnosis not present

## 2021-05-13 DIAGNOSIS — R0902 Hypoxemia: Secondary | ICD-10-CM | POA: Diagnosis present

## 2021-05-13 DIAGNOSIS — F32A Depression, unspecified: Secondary | ICD-10-CM | POA: Diagnosis present

## 2021-05-13 DIAGNOSIS — K219 Gastro-esophageal reflux disease without esophagitis: Secondary | ICD-10-CM | POA: Diagnosis present

## 2021-05-13 DIAGNOSIS — M6250 Muscle wasting and atrophy, not elsewhere classified, unspecified site: Secondary | ICD-10-CM | POA: Diagnosis present

## 2021-05-13 DIAGNOSIS — Z20822 Contact with and (suspected) exposure to covid-19: Secondary | ICD-10-CM | POA: Diagnosis present

## 2021-05-13 DIAGNOSIS — K449 Diaphragmatic hernia without obstruction or gangrene: Secondary | ICD-10-CM | POA: Diagnosis present

## 2021-05-13 DIAGNOSIS — X58XXXA Exposure to other specified factors, initial encounter: Secondary | ICD-10-CM | POA: Diagnosis not present

## 2021-05-13 DIAGNOSIS — Z9181 History of falling: Secondary | ICD-10-CM | POA: Diagnosis not present

## 2021-05-13 MED ORDER — IOHEXOL 300 MG/ML  SOLN
75.0000 mL | Freq: Once | INTRAMUSCULAR | Status: AC | PRN
  Administered 2021-05-13: 75 mL via INTRAVENOUS

## 2021-05-13 MED ORDER — BACLOFEN 10 MG PO TABS
10.0000 mg | ORAL_TABLET | Freq: Three times a day (TID) | ORAL | Status: DC
  Administered 2021-05-13 – 2021-05-15 (×6): 10 mg via ORAL
  Filled 2021-05-13 (×6): qty 1

## 2021-05-13 MED ORDER — MORPHINE SULFATE (PF) 2 MG/ML IV SOLN
2.0000 mg | INTRAVENOUS | Status: DC | PRN
Start: 2021-05-13 — End: 2021-05-15
  Administered 2021-05-14: 2 mg via INTRAVENOUS
  Administered 2021-05-15: 4 mg via INTRAVENOUS
  Filled 2021-05-13: qty 1
  Filled 2021-05-13: qty 2

## 2021-05-13 MED ORDER — KETOROLAC TROMETHAMINE 30 MG/ML IJ SOLN
30.0000 mg | Freq: Four times a day (QID) | INTRAMUSCULAR | Status: DC
  Administered 2021-05-13 – 2021-05-14 (×2): 30 mg via INTRAVENOUS
  Filled 2021-05-13 (×2): qty 1

## 2021-05-13 MED ORDER — ACETAMINOPHEN 325 MG PO TABS: 650.0000 mg | ORAL_TABLET | Freq: Four times a day (QID) | ORAL | Status: DC | PRN

## 2021-05-13 MED ORDER — OXYCODONE-ACETAMINOPHEN 5-325 MG PO TABS
1.0000 | ORAL_TABLET | Freq: Four times a day (QID) | ORAL | Status: DC | PRN
  Administered 2021-05-13: 1 via ORAL
  Administered 2021-05-13 – 2021-05-15 (×5): 2 via ORAL
  Filled 2021-05-13 (×5): qty 2
  Filled 2021-05-13: qty 1

## 2021-05-13 NOTE — NC FL2 (Signed)
[place] MEDICAID FL2 LEVEL OF CARE SCREENING TOOL     IDENTIFICATION  Patient Name: Alexandra Norman Birthdate: 26-Aug-1956 Sex: female Admission Date (Current Location): 05/11/2021  [facility] and [place] Number:  Producer, television/film/video and Address:  [facility], [address] [place], [place] [address]      Provider Number: [id number]  Attending Physician Name and Address:  Rolly Salter, MD  Relative Name and Phone Number:       Current Level of Care: Hospital Recommended Level of Care: Skilled Nursing Facility Prior Approval Number:    Date Approved/Denied:   PASRR Number: [id number]  Discharge Plan: SNF    Current Diagnoses: Patient Active Problem List   Diagnosis Date Noted   Fall 05/13/2021   Rib fractures 05/12/2021   Leukocytosis 05/12/2021   Mood disorder (HCC) 05/12/2021   Hyperglycemia 07/09/2019   S/P ORIF (open reduction internal fixation) fracture 08/12/2016   Pain, wrist, left 08/09/2016   Constipation 04/29/2015   Encounter to establish care 04/29/2015   Peripheral neuropathy 09/07/2012   Charcot-Marie-Tooth disease 01/31/2012   Hypothyroid 01/31/2012   Atrial septal defect 01/31/2012   Hyperlipidemia 01/31/2012   GERD (gastroesophageal reflux disease) 01/31/2012   Osteoarthritis 01/31/2012   Osteoporosis 01/31/2012   Depression 01/31/2012   ADD (attention deficit disorder) 01/31/2012   Fibromyalgia 01/31/2012   Allergic rhinitis 01/31/2012    Orientation RESPIRATION BLADDER Height & Weight     Self, Time, Situation, Place  Normal Continent Weight: 180 lb (81.6 kg) Height:  5\' 3"  (160 cm)  BEHAVIORAL SYMPTOMS/MOOD NEUROLOGICAL BOWEL NUTRITION STATUS      Continent Diet  AMBULATORY STATUS COMMUNICATION OF NEEDS Skin   Extensive Assist Verbally Normal                       Personal Care Assistance Level of Assistance  Bathing, Feeding, Dressing Bathing Assistance: Limited assistance Feeding assistance:  Independent Dressing Assistance: Limited assistance     Functional Limitations Info  Sight, Hearing, Speech Sight Info: Adequate Hearing Info: Adequate Speech Info: Adequate    SPECIAL CARE FACTORS FREQUENCY  PT (By licensed PT), OT (By licensed OT)     PT Frequency: 5x a week OT Frequency: 5x a week            Contractures Contractures Info: Not present    Additional Factors Info  Code Status, Allergies Code Status Info: DNR Allergies Info: Penicillins   Statins   Sulfa Antibiotics           Current Medications (05/13/2021):  This is the current hospital active medication list Current Facility-Administered Medications  Medication Dose Route Frequency Provider Last Rate Last Admin   acetaminophen (TYLENOL) tablet 650 mg  650 mg Oral Q6H PRN 07/13/2021, MD       baclofen (LIORESAL) tablet 10 mg  10 mg Oral TID Rolly Salter, MD   10 mg at 05/13/21 1601   buPROPion (WELLBUTRIN XL) 24 hr tablet 450 mg  450 mg Oral QPC lunch 07/13/21, MD   450 mg at 05/13/21 1200   fluticasone (FLONASE) 50 MCG/ACT nasal spray 2 spray  2 spray Each Nare Daily 07/13/21, MD   2 spray at 05/13/21 0907   gabapentin (NEURONTIN) capsule 800 mg  800 mg Oral QID 07/13/21, MD   800 mg at 05/13/21 1316   ketorolac (TORADOL) 30 MG/ML injection 30 mg  30 mg Intravenous Q6H 07/13/21, MD  lamoTRIgine (LAMICTAL) tablet 150 mg  150 mg Oral QPC lunch Albertine Grates, MD   150 mg at 05/13/21 1200   lidocaine (LIDODERM) 5 % 1 patch  1 patch Transdermal Q24H Albertine Grates, MD   1 patch at 05/13/21 1434   loratadine (CLARITIN) tablet 10 mg  10 mg Oral Daily John Giovanni, MD   10 mg at 05/13/21 0906   morphine 2 MG/ML injection 2-4 mg  2-4 mg Intravenous Q3H PRN Rolly Salter, MD       OXcarbazepine (TRILEPTAL) tablet 150 mg  150 mg Oral QHS Albertine Grates, MD       oxyCODONE-acetaminophen (PERCOCET/ROXICET) 5-325 MG per tablet 1-2 tablet  1-2 tablet Oral Q6H PRN Rolly Salter, MD   1  tablet at 05/13/21 1558   pantoprazole (PROTONIX) EC tablet 40 mg  40 mg Oral Daily John Giovanni, MD   40 mg at 05/13/21 0906   polyethylene glycol (MIRALAX / GLYCOLAX) packet 17 g  17 g Oral Daily Albertine Grates, MD   17 g at 05/13/21 1916   QUEtiapine (SEROQUEL) tablet 200 mg  200 mg Oral QHS Albertine Grates, MD   200 mg at 05/12/21 2120   senna-docusate (Senokot-S) tablet 1 tablet  1 tablet Oral BID Albertine Grates, MD   1 tablet at 05/13/21 6060     Discharge Medications: Please see discharge summary for a list of discharge medications.  Relevant Imaging Results:  Relevant Lab Results:   Additional Information OKH:[id number]  Jimmy Picket, LCSW

## 2021-05-13 NOTE — TOC CAGE-AID Note (Signed)
Transition of Care [facility]) - CAGE-AID Screening   Patient Details  Name: [name] MRN: [id number] Date of Birth: 05/12/1956  Transition of Care [facility]) CM/SW Contact:    Lossie Faes Tarpley-Carter, LCSWA Phone Number: 05/13/2021, 3:24 PM   Clinical Narrative: Pt participated in Cage-Aid.  Pt stated she does not use substance or ETOH.  Pt was not offered resources., due to no usage of substance or ETOH.   Kalila Adkison Tarpley-Carter, MSW, LCSW-A Pronouns:  She/Her/Hers [facility] Clinical Social Worker Direct Number:  [phone] [email or web address]  CAGE-AID Screening:    Have You Ever Felt You Ought to Cut Down on Your Drinking or Drug Use?: No Have People Annoyed You By [employer] Your Drinking Or Drug Use?: No Have You Felt Bad Or Guilty About Your Drinking Or Drug Use?: No Have You Ever Had a Drink or Used Drugs First Thing In The Morning to Steady Your Nerves or to Get Rid of a Hangover?: No CAGE-AID Score: 0  Substance Abuse Education Offered: No

## 2021-05-13 NOTE — Progress Notes (Signed)
[facility] Progress Note  Patient: Alexandra Norman    LOV:[id number]  DOA: 05/11/2021     Date of Service: the patient was seen and examined on 05/13/2021  Brief hospital course: Past medical history of Charcot-Marie-Tooth disease, frequent fall, HLD, thyroid disease, depression, fibromyalgia.  Presents with complaints of mechanical fall with rib fracture. Patient was discussed with trauma surgery who recommended medical admission. Currently plan is pain control and further work-up.  Subjective: Continues to have pain in the left chest area.  No nausea no vomiting no fever no chills.  Assessment and Plan: Left lateral 4th through 6th rib fractures Secondary to mechanical fall.   No pneumothorax on x-ray.   Patient was splinting and placed on 2 L supplemental oxygen for comfort.   Received fentanyl, Toradol, and Percocet in the ED but still endorsing severe pain. Pain control: Morphine as needed, Percocet as needed, Toradol as needed.   Continue home baclofen and gabapentin.   Incentive spirometry, PT/OT, fall precautions   Mild leukocytosis Likely reactive.  No infectious signs or symptoms. -Repeat CBC in a.m.   Charcot-Marie-Tooth disease -Continue home medications   Mood disorder -Continue home medications   GERD -Continue PPI  Left pleural effusion. Will perform CT of the chest to ensure that there is no evidence of hemothorax or pneumothorax.  Scheduled Meds:  baclofen  10 mg Oral TID   buPROPion  450 mg Oral QPC lunch   fluticasone  2 spray Each Nare Daily   gabapentin  800 mg Oral QID   ketorolac  30 mg Intravenous Q6H   lamoTRIgine  150 mg Oral QPC lunch   lidocaine  1 patch Transdermal Q24H   loratadine  10 mg Oral Daily   OXcarbazepine  150 mg Oral QHS   pantoprazole  40 mg Oral Daily   polyethylene glycol  17 g Oral Daily   QUEtiapine  200 mg Oral QHS   senna-docusate  1 tablet Oral BID   Continuous Infusions: PRN Meds: acetaminophen, morphine  injection, oxyCODONE-acetaminophen  Body mass index is 31.89 kg/m.        DVT Prophylaxis: SCD, pharmacological prophylaxis contraindicated due to concern with bleeding   SCDs Start: 05/12/21 0239    Advance goals of care discussion: Pt is DNR.  Family Communication: no family was present at bedside, at the time of interview.   Data Reviewed: I have personally reviewed and interpreted daily labs, tele strips, imaging. CT chest ordered.  Currently pending.  Physical Exam:  General: Appear in mild distress, no Rash; Oral Mucosa Clear, moist. no Abnormal Neck Mass Or lumps, Conjunctiva normal  Cardiovascular: S1 and S2 Present, no Murmur, Respiratory: good respiratory effort, Bilateral Air entry present and CTA, no Crackles, no wheezes Abdomen: Bowel Sound present, Soft and no tenderness Extremities: no Pedal edema Neurology: alert and oriented to time, place, and person affect appropriate. no new focal deficit Gait not checked due to patient safety concerns  Vitals:   05/13/21 0424 05/13/21 0745 05/13/21 1152 05/13/21 1527  BP: 121/60 (!) 130/59 (!) 149/62 (!) 166/71  Pulse: 70 73 79 80  Resp: 15 16 16 16   Temp: 98 F (36.7 C) 98.3 F (36.8 C) 98.1 F (36.7 C) 98.9 F (37.2 C)  TempSrc: Oral Oral Oral Oral  SpO2: 95% 94% 94% 94%  Weight:      Height:        Disposition:  Status is: Inpatient  Remains inpatient appropriate because:Ongoing active pain requiring inpatient pain management  Dispo:  The patient is from: Home              Anticipated d/c is to: SNF              Patient currently is not medically stable to d/c.   Difficult to place patient No  Time spent: 35 minutes. I reviewed all nursing notes, pharmacy notes, vitals, pertinent old records. I have discussed plan of care as described above with RN.  Author: Lynden Oxford, MD [facility] 05/13/2021 6:39 PM  To reach On-call, see care teams to locate the attending and reach out via  [email or web address]. Between 7PM-7AM, please contact night-coverage If you still have difficulty reaching the attending provider, please page the [facility] (Director on Call) for [facility] on amion for assistance.

## 2021-05-14 LAB — CBC WITH DIFFERENTIAL/PLATELET
Abs Immature Granulocytes: 0.03 10*3/uL (ref 0.00–0.07)
Basophils Absolute: 0 10*3/uL (ref 0.0–0.1)
Basophils Relative: 1 %
Eosinophils Absolute: 0.2 10*3/uL (ref 0.0–0.5)
Eosinophils Relative: 2 %
HCT: 36.2 % (ref 36.0–46.0)
Hemoglobin: 11.4 g/dL — ABNORMAL LOW (ref 12.0–15.0)
Immature Granulocytes: 0 %
Lymphocytes Relative: 28 %
Lymphs Abs: 2.2 10*3/uL (ref 0.7–4.0)
MCH: 27.2 pg (ref 26.0–34.0)
MCHC: 31.5 g/dL (ref 30.0–36.0)
MCV: 86.4 fL (ref 80.0–100.0)
Monocytes Absolute: 0.6 10*3/uL (ref 0.1–1.0)
Monocytes Relative: 8 %
Neutro Abs: 4.9 10*3/uL (ref 1.7–7.7)
Neutrophils Relative %: 61 %
Platelets: 270 10*3/uL (ref 150–400)
RBC: 4.19 MIL/uL (ref 3.87–5.11)
RDW: 16.4 % — ABNORMAL HIGH (ref 11.5–15.5)
WBC: 8 10*3/uL (ref 4.0–10.5)
nRBC: 0 % (ref 0.0–0.2)

## 2021-05-14 LAB — BASIC METABOLIC PANEL
Anion gap: 7 (ref 5–15)
BUN: 15 mg/dL (ref 8–23)
CO2: 30 mmol/L (ref 22–32)
Calcium: 9 mg/dL (ref 8.9–10.3)
Chloride: 102 mmol/L (ref 98–111)
Creatinine, Ser: 0.77 mg/dL (ref 0.44–1.00)
GFR, Estimated: >60 mL/min (ref >60)
Glucose, Bld: 99 mg/dL (ref 70–99)
Potassium: 3.8 mmol/L (ref 3.5–5.1)
Sodium: 139 mmol/L (ref 135–145)

## 2021-05-14 MED ORDER — BISACODYL 10 MG RE SUPP
10.0000 mg | Freq: Once | RECTAL | Status: AC
  Administered 2021-05-14: 10 mg via RECTAL
  Filled 2021-05-14: qty 1

## 2021-05-14 MED ORDER — MUSCLE RUB 10-15 % EX CREA
TOPICAL_CREAM | CUTANEOUS | Status: DC | PRN
  Filled 2021-05-14: qty 85

## 2021-05-14 MED ORDER — KETOROLAC TROMETHAMINE 30 MG/ML IJ SOLN
30.0000 mg | Freq: Three times a day (TID) | INTRAMUSCULAR | Status: DC
  Administered 2021-05-14 – 2021-05-15 (×3): 30 mg via INTRAVENOUS
  Filled 2021-05-14 (×3): qty 1

## 2021-05-14 NOTE — Progress Notes (Signed)
RT  NOTE:  Pt refusing CPAP tonight. She states she is leaving tomorrow and doesn't want to wear for one night.

## 2021-05-14 NOTE — TOC Progression Note (Signed)
Transition of Care [facility]) - Progression Note    Patient Details  Name: Alexandra Norman MRN: [id number] Date of Birth: 1955-11-16  Transition of Care [facility]) CM/SW Contact  Jimmy Picket, [place] Phone Number: 05/14/2021, 1:08 PM  Clinical Narrative:     CSW gave pt bed offers. Pt chose [facility]. CSW called admissions coordinator to confirm they can take pt tomorrow at DC. CSW is waiting for a call back from [place]. CSW asked MD for covid test.  Expected Discharge Plan: Skilled Nursing Facility Barriers to Discharge: Continued Medical Work up, English as a second language teacher  Expected Discharge Plan and Services Expected Discharge Plan: Skilled Nursing Facility                                               Social Determinants of Health (SDOH) Interventions    Readmission Risk Interventions No flowsheet data found.  Jimmy Picket, Theresia Majors, [place] Clinical Social Worker [phone]

## 2021-05-14 NOTE — Progress Notes (Addendum)
PIV removed per patient's request. This RN informed patient that iv will need to be re-inserted for iv medication. Patient in agreement. Will handover to oncoming nurse.  06:50 PM Patient requests for pain medication, Oxycodone cannot be administered at this time per order. Patient notified about this and is in agreement to get medication at the due time during report at shift change.

## 2021-05-14 NOTE — Progress Notes (Signed)
[facility] Progress Note  Patient: Alexandra Norman    UMP:[id number]  DOA: 05/11/2021     Date of Service: the patient was seen and examined on 05/14/2021  Brief hospital course: Past medical history of Charcot-Marie-Tooth disease, frequent fall, HLD, thyroid disease, depression, fibromyalgia.  Presents with complaints of mechanical fall with rib fracture. Patient was discussed with trauma surgery who recommended medical admission. Currently plan is pain control and further work-up.  Subjective: Pain improving.  No nausea no vomiting.  No fever no chills.  Reports concern with NSAIDs with GERD.  Assessment and Plan: Left lateral 4th through 6th rib fractures Secondary to mechanical fall.   No pneumothorax on x-ray.   Patient was splinting and placed on 2 L supplemental oxygen for comfort.   Received fentanyl, Toradol, and Percocet in the ED but still endorsing severe pain. Pain control: Morphine as needed, Percocet as needed, Toradol scheduled. Continue home baclofen and gabapentin.   Incentive spirometry, PT/OT, fall precautions   Mild leukocytosis Likely reactive.  No infectious signs or symptoms.   Charcot-Marie-Tooth disease -Continue home medications   Mood disorder -Continue home medications   GERD -Continue PPI  Left pleural effusion. CT chest negative for any acute new abnormality or new fracture or pneumothorax or hemothorax.  Effusion small.  Patient not hypoxic.  Monitor.  Monitor.  Obesity. Presented patient at high risk for poor outcome. Monitor.  OSA. CPAP nightly.  Scheduled Meds:  baclofen  10 mg Oral TID   buPROPion  450 mg Oral QPC lunch   fluticasone  2 spray Each Nare Daily   gabapentin  800 mg Oral QID   ketorolac  30 mg Intravenous Q8H   lamoTRIgine  150 mg Oral QPC lunch   lidocaine  1 patch Transdermal Q24H   loratadine  10 mg Oral Daily   OXcarbazepine  150 mg Oral QHS   pantoprazole  40 mg Oral Daily   polyethylene glycol  17 g  Oral Daily   QUEtiapine  200 mg Oral QHS   senna-docusate  1 tablet Oral BID   Continuous Infusions: PRN Meds: acetaminophen, morphine injection, Muscle Rub, oxyCODONE-acetaminophen  Body mass index is 31.89 kg/m.        DVT Prophylaxis: SCD, pharmacological prophylaxis contraindicated due to concern with bleeding   SCDs Start: 05/12/21 0239    Advance goals of care discussion: Pt is DNR.  Family Communication: no family was present at bedside, at the time of interview.   Data Reviewed: I have personally reviewed and interpreted daily labs, tele strips, imaging. Electrolytes stable.  Hemoglobin stable.  CT chest negative for any acute abnormality other than rib fracture and small pleural effusion.  Physical Exam:  General: Appear in mild distress, no Rash; Oral Mucosa Clear, moist. no Abnormal Neck Mass Or lumps, Conjunctiva normal  Cardiovascular: S1 and S2 Present, no Murmur, Respiratory: good respiratory effort, Bilateral Air entry present and CTA, no Crackles, no wheezes Abdomen: Bowel Sound present, Soft and no tenderness Extremities: no Pedal edema Neurology: alert and oriented to time, place, and person affect appropriate. no new focal deficit Gait not checked due to patient safety concerns   Vitals:   05/13/21 1946 05/14/21 0315 05/14/21 0956 05/14/21 1530  BP: (!) 163/81 114/61 (!) 122/44 (!) 134/51  Pulse: 86 75 77 80  Resp: 16 16 16 17   Temp: 98.5 F (36.9 C) 97.6 F (36.4 C) 98 F (36.7 C) 98.4 F (36.9 C)  TempSrc: Oral Oral Oral Oral  SpO2: 98%  96% 93% 95%  Weight:      Height:        Disposition:  Status is: Inpatient  Remains inpatient appropriate because:Ongoing active pain requiring inpatient pain management  Dispo: The patient is from: Home              Anticipated d/c is to: SNF              Patient currently is not medically stable to d/c.   Difficult to place patient No  Time spent: 35 minutes. I reviewed all nursing notes, pharmacy  notes, vitals, pertinent old records. I have discussed plan of care as described above with RN.  Author: Lynden Oxford, MD [facility] 05/14/2021 5:30 PM  To reach On-call, see care teams to locate the attending and reach out via [email or web address]. Between 7PM-7AM, please contact night-coverage If you still have difficulty reaching the attending provider, please page the [facility] (Director on Call) for [facility] on amion for assistance.

## 2021-05-15 ENCOUNTER — Encounter (HOSPITAL_COMMUNITY): Payer: Self-pay | Admitting: Emergency Medicine

## 2021-05-15 ENCOUNTER — Emergency Department (HOSPITAL_COMMUNITY)
Admission: EM | Admit: 2021-05-15 | Discharge: 2021-05-16 | Disposition: A | Discharge status: Home / Self Care | Payer: Medicare Other | Attending: Emergency Medicine | Admitting: Emergency Medicine

## 2021-05-15 DIAGNOSIS — E039 Hypothyroidism, unspecified: Secondary | ICD-10-CM | POA: Insufficient documentation

## 2021-05-15 DIAGNOSIS — K219 Gastro-esophageal reflux disease without esophagitis: Secondary | ICD-10-CM | POA: Insufficient documentation

## 2021-05-15 DIAGNOSIS — X58XXXA Exposure to other specified factors, initial encounter: Secondary | ICD-10-CM | POA: Insufficient documentation

## 2021-05-15 DIAGNOSIS — S2249XA Multiple fractures of ribs, unspecified side, initial encounter for closed fracture: Secondary | ICD-10-CM | POA: Insufficient documentation

## 2021-05-15 DIAGNOSIS — R0781 Pleurodynia: Secondary | ICD-10-CM

## 2021-05-15 LAB — CBC WITH DIFFERENTIAL/PLATELET
Abs Immature Granulocytes: 0.03 K/uL (ref 0.00–0.07)
Basophils Absolute: 0 K/uL (ref 0.0–0.1)
Basophils Relative: 1 %
Eosinophils Absolute: 0.2 K/uL (ref 0.0–0.5)
Eosinophils Relative: 2 %
HCT: 37.5 % (ref 36.0–46.0)
Hemoglobin: 11.5 g/dL — ABNORMAL LOW (ref 12.0–15.0)
Immature Granulocytes: 0 %
Lymphocytes Relative: 24 %
Lymphs Abs: 2.1 K/uL (ref 0.7–4.0)
MCH: 26.7 pg (ref 26.0–34.0)
MCHC: 30.7 g/dL (ref 30.0–36.0)
MCV: 87.2 fL (ref 80.0–100.0)
Monocytes Absolute: 0.7 K/uL (ref 0.1–1.0)
Monocytes Relative: 9 %
Neutro Abs: 5.6 K/uL (ref 1.7–7.7)
Neutrophils Relative %: 64 %
Platelets: 311 K/uL (ref 150–400)
RBC: 4.3 MIL/uL (ref 3.87–5.11)
RDW: 16.7 % — ABNORMAL HIGH (ref 11.5–15.5)
WBC: 8.7 K/uL (ref 4.0–10.5)
nRBC: 0 % (ref 0.0–0.2)

## 2021-05-15 LAB — BASIC METABOLIC PANEL WITH GFR
Anion gap: 5 (ref 5–15)
BUN: 18 mg/dL (ref 8–23)
CO2: 30 mmol/L (ref 22–32)
Calcium: 9.1 mg/dL (ref 8.9–10.3)
Chloride: 103 mmol/L (ref 98–111)
Creatinine, Ser: 0.75 mg/dL (ref 0.44–1.00)
GFR, Estimated: >60 mL/min
Glucose, Bld: 88 mg/dL (ref 70–99)
Potassium: 4.2 mmol/L (ref 3.5–5.1)
Sodium: 138 mmol/L (ref 135–145)

## 2021-05-15 LAB — RESP PANEL BY RT-PCR (FLU A&B, COVID) ARPGX2
Influenza A by PCR: NEGATIVE
Influenza B by PCR: NEGATIVE
SARS Coronavirus 2 by RT PCR: NEGATIVE

## 2021-05-15 MED ORDER — BACLOFEN 10 MG PO TABS: 10.0000 mg | ORAL_TABLET | Freq: Three times a day (TID) | ORAL | 0 refills | Status: DC

## 2021-05-15 MED ORDER — MEDI-PATCH-LIDOCAINE 0.5-0.035-5-20 % EX PTCH: 1.0000 | MEDICATED_PATCH | Freq: Every day | CUTANEOUS | 0 refills | Status: DC

## 2021-05-15 MED ORDER — TRAMADOL HCL 50 MG PO TABS: 100.0000 mg | ORAL_TABLET | Freq: Two times a day (BID) | ORAL | 2 refills | Status: DC

## 2021-05-15 MED ORDER — MUSCLE RUB 10-15 % EX CREA: 1.0000 "application " | TOPICAL_CREAM | CUTANEOUS | 0 refills | Status: DC | PRN

## 2021-05-15 MED ORDER — KETOROLAC TROMETHAMINE 10 MG PO TABS
10.0000 mg | ORAL_TABLET | Freq: Four times a day (QID) | ORAL | 0 refills | Status: DC | PRN
Start: 2021-05-15 — End: 2021-05-15

## 2021-05-15 MED ORDER — OXYCODONE-ACETAMINOPHEN 5-325 MG PO TABS: 1.0000 | ORAL_TABLET | ORAL | 0 refills | Status: DC | PRN

## 2021-05-15 MED ORDER — KETOROLAC TROMETHAMINE 10 MG PO TABS: 10.0000 mg | ORAL_TABLET | Freq: Four times a day (QID) | ORAL | 0 refills | Status: DC | PRN

## 2021-05-15 MED ORDER — KETOROLAC TROMETHAMINE 60 MG/2ML IM SOLN
30.0000 mg | Freq: Once | INTRAMUSCULAR | Status: AC
  Administered 2021-05-15: 30 mg via INTRAMUSCULAR
  Filled 2021-05-15: qty 2

## 2021-05-15 MED ORDER — POLYETHYLENE GLYCOL 3350 17 G PO PACK: 17.0000 g | PACK | Freq: Every day | ORAL | 0 refills | Status: DC

## 2021-05-15 MED ORDER — LIDOCAINE 5 % EX PTCH: 1.0000 | MEDICATED_PATCH | CUTANEOUS | 0 refills | Status: DC

## 2021-05-15 MED ORDER — LIDOCAINE 5 % EX PTCH
1.0000 | MEDICATED_PATCH | CUTANEOUS | Status: DC
  Administered 2021-05-15: 1 via TRANSDERMAL
  Filled 2021-05-15: qty 1

## 2021-05-15 NOTE — Discharge Instructions (Addendum)
Please stop taking your tramadol while you are taking percocet. Once your percocet prescription runs out, return to tramadol at previous dose.

## 2021-05-15 NOTE — TOC Transition Note (Addendum)
Transition of Care [facility]) - CM/SW Discharge Note   Patient Details  Name: Fatumata Kashani MRN: [id number] Date of Birth: 04-Jan-1956  [facility]) CM/SW Contact:  Jimmy Picket, LCSW Phone Number: 05/15/2021, 10:13 AM   Clinical Narrative:     Patient will DC to: [facility] Anticipated DC date: 05/15/21 Family notified: Pt will notify Transport by: Sharin Mons     Per MD patient ready for DC to [facility]. RN, patient, patient's family, and facility notified of DC. Discharge Summary and FL2 sent to facility. DC packet on chart. Ambulance transport requested for patient.    RN to call report to [phone]. Pt going to room [address] [place].  CSW will sign off for now as social work intervention is no longer needed. Please consult [place] again if new needs arise.   Final next level of care: Skilled Nursing Facility Barriers to Discharge: Barriers Resolved   Patient Goals and CMS Choice        Discharge Placement              Patient chooses bed at: [facility] Patient to be transferred to facility by: [facility] Name of family member notified: Pt will notify Patient and family notified of of transfer: 05/15/21  Discharge Plan and Services                                     Social Determinants of Health (SDOH) Interventions     Readmission Risk Interventions No flowsheet data found.  Jimmy Picket, Theresia Majors, [place] Clinical Social Worker [phone]

## 2021-05-15 NOTE — Discharge Summary (Addendum)
[facility] Discharge Summary   Patient: Alexandra Norman RXV:[id number]  PCP: Alexandra Covey, MD  Date of admission: 05/11/2021   Date of discharge:  05/15/2021     Discharge Diagnoses:  Principal Problem:   Rib fractures Active Problems:   Charcot-Marie-Tooth disease   GERD (gastroesophageal reflux disease)   Leukocytosis   Mood disorder (HCC)   Fall   Admitted From: home Disposition:  SNF   Recommendations for Outpatient Follow-up:  PCP: please follow up with PCP in 2 weeks  Follow-up Information     Alexandra Covey, MD. Schedule an appointment as soon as possible for a visit in 1 week(s).   Specialty: Family Medicine Contact information: [address] [place] [address] [phone]                Discharge Instructions     Diet - low sodium heart healthy   Complete by: As directed    Discharge instructions   Complete by: As directed    It is important that you read the instructions as well as go over your medication list with RN to help you understand your care after this hospitalization.  Please follow-up with PCP in 1-2 weeks.  Please note that we are unable to authorize any refills for discharge medications, once you are discharged. Thus, it is imperative that you return to your primary care physician (or establish a relationship with a primary care physician if you do not have one) for your care needs. So that they can reassess your need for medications and monitor your lab values.  Please request your primary care physician to go over all Hospital Tests and Procedure/Radiological results at the follow up. Please get all Hospital records sent to your PCP by signing hospital release before you go home.   Do not drive, operating heavy machinery, perform activities at heights, swimming or participation in water activities or provide baby sitting services while you are on Pain, Sleep and Anxiety Medications; until you have been seen by  Primary Care Physician and are cleared to do such activities.  Do not take more than prescribed Pain, Sleep and Anxiety Medications.  You were cared for by a hospitalist during your hospital stay. If you have any questions about your discharge medications or the care you received while you were in the hospital after you are discharged, you can call the hospital unit/floor you were admitted to and ask to speak with the hospitalist who took care of you. Ask for Hospitalist on call, if the hospitalist that took care of you is not available. Once you are discharged, your primary care physician will help you with any further medical issues. You Must read complete instructions/literature along with all the possible adverse reactions/side effects for all the Medicines you take and that have been prescribed to you. Take any new Medicines after you have completely understood and accept all the possible adverse reactions/side effects. If you have smoked or chewed Tobacco, please STOP smoking. If you drink alcohol, please safely reduce the use. Do not drive, operating heavy machinery, perform activities at heights, swimming or participation in water activities or provide baby sitting services under influence.   Increase activity slowly   Complete by: As directed        Diet recommendation: Regular diet  Activity: The patient is advised to gradually reintroduce usual activities, as tolerated  Discharge Condition: stable  Code Status: DNR   History of present illness: As per the H and P  dictated on admission, "Alexandra Norman is a 65 y.o. female with medical history significant of Charcot-Marie-Tooth disease, frequent falls, hyperlipidemia, thyroid disease, depression, fibromyalgia presenting to the ED complaining of left-sided rib pain after she slipped and fell in her shower tonight.  Splinting and placed on 2 L supplemental oxygen for comfort.  Basic labs showing slight leukocytosis.  No significant  metabolic derangements.  Screening COVID test pending.  X-ray showing left lateral 4th through 6th rib fractures, no pneumothorax.  ED physician discussed the case with Dr. Derrell Lolling from trauma surgery who requested admission by medicine service.  Patient was given fentanyl, Toradol, Zofran, and Percocet.   Patient states tonight while taking a shower she slipped and fell in her tub hitting the left side of her chest at the side of the tub.  Since then she is having severe pain in her left lateral ribs.  Denies head injury or loss of consciousness.  No other pain.  Reports history of frequent falls secondary to Charcot-Marie-Tooth disease.  She received physical therapy for 8 weeks last year.  Denies fevers, cough, shortness of breath, chest pain, nausea, vomiting, abdominal pain, diarrhea, or dysuria."  Hospital Course:  Summary of her active problems in the hospital is as following.   Left lateral 4th through 6th rib fractures Secondary to mechanical fall.   No pneumothorax on x-ray.   Patient was splinting and placed on 2 L supplemental oxygen for comfort.   Received fentanyl, Toradol, and Percocet in the ED but still endorsing severe pain. Pain control: Morphine as needed, Percocet as needed, was given Toradol scheduled. Continue home baclofen and gabapentin.   Incentive spirometry, PT/OT, fall precautions   Mild leukocytosis Likely reactive.  No infectious signs or symptoms.   Charcot-Marie-Tooth disease -Continue home medications   Mood disorder -Continue home medications   GERD -Continue PPI   Left pleural effusion. CT chest negative for any acute new abnormality or new fracture or pneumothorax or hemothorax.  Effusion small.  Patient not hypoxic.  Monitor.  Monitor.  Large hiatal hernia  Follow up with Gastroenterology, pt tells me that she has referral to Gastroenterology.  OSA  CPAP QHS  Body mass index is 31.89 kg/m.    Nutrition Interventions:        Pain  control  - [place] Controlled Substance Reporting System database was reviewed. - 5 day supply was provided. - Patient was instructed, not to drive, operate heavy machinery, perform activities at heights, swimming or participation in water activities or provide baby sitting services while on Pain, Sleep and Anxiety Medications; until her outpatient Physician has advised to do so again.  - Also recommended to not to take more than prescribed Pain, Sleep and Anxiety Medications.  Patient was seen by physical therapy, who recommended SNF,. On the day of the discharge the patient's vitals were stable, and no other new acute medical condition were reported. The patient was felt safe to be discharge at SNF with Therapy.  Consultants: none Procedures: none  DISCHARGE MEDICATION: Allergies as of 05/15/2021       Reactions   Penicillins    hives   Statins    Sulfa Antibiotics         Medication List     STOP taking these medications    aspirin 325 MG tablet   BAYER BACK & BODY PO   doxycycline 100 MG tablet Commonly known as: VIBRA-TABS       TAKE these medications    ADULT GROWTH  HORMONE SUPPORT PO Take 1 capsule by mouth daily.   baclofen 10 MG tablet Commonly known as: LIORESAL Take 1 tablet (10 mg total) by mouth 3 (three) times daily. What changed: when to take this   buPROPion 150 MG 24 hr tablet Commonly known as: WELLBUTRIN XL Take 450 mg by mouth daily.   calcium carbonate 1500 (600 Ca) MG Tabs tablet Commonly known as: OSCAL Take 1,500 mg by mouth daily with breakfast.   Co Q10 100 MG Caps Take 100 mg by mouth daily.   Cranberry 400 MG Caps Take 400 mg by mouth daily.   esomeprazole 40 MG capsule Commonly known as: NexIUM Take 1 capsule (40 mg total) by mouth daily before breakfast.   fluticasone 50 MCG/ACT nasal spray Commonly known as: FLONASE SPRAY 2 SPRAYS INTO EACH NOSTRIL EVERY DAY What changed: See the new instructions.    gabapentin 800 MG tablet Commonly known as: NEURONTIN TAKE 1 TABLET BY MOUTH FOUR TIMES A DAY   lamoTRIgine 150 MG tablet Commonly known as: LAMICTAL Take 150 mg by mouth daily.   lidocaine 5 % Commonly known as: LIDODERM Place 1 patch onto the skin daily. Remove & Discard patch within 12 hours or as directed by MD   loratadine 10 MG tablet Commonly known as: CLARITIN Take 10 mg by mouth daily.   MULTIVITAMIN & MINERAL PO Take 1 tablet by mouth daily.   Muscle Rub 10-15 % Crea Apply 1 application topically as needed for muscle pain.   naproxen sodium 220 MG tablet Commonly known as: ALEVE Take 220 mg by mouth daily as needed (pain).   ondansetron 4 MG tablet Commonly known as: ZOFRAN Take 4 mg by mouth every 8 (eight) hours as needed for vomiting or nausea.   OXcarbazepine 150 MG tablet Commonly known as: TRILEPTAL Take 150 mg by mouth 2 (two) times daily.   oxyCODONE-acetaminophen 5-325 MG tablet Commonly known as: PERCOCET/ROXICET Take 1-2 tablets by mouth every 4 (four) hours as needed for moderate pain or severe pain.   polyethylene glycol 17 g packet Commonly known as: MIRALAX / GLYCOLAX Take 17 g by mouth daily.   Probiotic Colon Support Caps Take 1 capsule by mouth daily.   QUEtiapine 50 MG tablet Commonly known as: SEROQUEL Take 150 mg by mouth at bedtime.   traMADol 50 MG tablet Commonly known as: ULTRAM Take 2 tablets (100 mg total) by mouth 2 (two) times daily.   triamcinolone cream 0.1 % Commonly known as: KENALOG APPLY TO AFFECTED AREA EVERY DAY   vitamin C 500 MG tablet Commonly known as: ASCORBIC ACID Take 500 mg by mouth daily.   VITAMIN D (CHOLECALCIFEROL) PO Take 2,000 Units by mouth daily.       ASK your doctor about these medications    diclofenac sodium 1 % Gel Commonly known as: VOLTAREN Apply up to 4 times per day as needed for pain.        Discharge Exam: Filed Weights   05/11/21 2323  Weight: 81.6 kg    Vitals:   05/15/21 0512 05/15/21 0939  BP: (!) 114/54 (!) 132/58  Pulse: 78 78  Resp: 17 18  Temp: 98 F (36.7 C) 98.2 F (36.8 C)  SpO2: 96% 93%   General: Appear in no distress, no Rash; Oral Mucosa Clear, moist. no Abnormal Neck Mass Or lumps, Conjunctiva normal  Cardiovascular: S1 and S2 Present, no Murmur Respiratory: good respiratory effort, Bilateral Air entry present and CTA, no Crackles, no wheezes Abdomen:  Bowel Sound present, Soft and no tenderness Extremities: no Pedal edema Neurology: alert and oriented to time, place, and person affect appropriate. no new focal deficit  The results of significant diagnostics from this hospitalization (including imaging, microbiology, ancillary and laboratory) are listed below for reference.    Significant Diagnostic Studies: DG Ribs Unilateral W/Chest Left  Result Date: 05/12/2021 CLINICAL DATA:  Fall EXAM: LEFT RIBS AND CHEST - 3+ VIEW COMPARISON:  None. FINDINGS: Fractures through the left lateral 4th through 6th ribs. No visible pneumothorax. Cardiomegaly. Prior median sternotomy. Suspect hiatal hernia. Bibasilar atelectasis. No visible effusions. IMPRESSION: Left lateral 4th through 6th rib fractures.  No pneumothorax. Cardiomegaly. Hiatal hernia. Bibasilar atelectasis. Electronically Signed   By: Charlett Nose M.D.   On: 05/12/2021 01:11   CT CHEST W CONTRAST  Result Date: 05/13/2021 CLINICAL DATA:  Chest trauma, minor Abnormal xray - pleural effusion Fracture, rib(s) EXAM: CT CHEST WITH CONTRAST TECHNIQUE: Multidetector CT imaging of the chest was performed during intravenous contrast administration. CONTRAST:  75mL OMNIPAQUE IOHEXOL 300 MG/ML  SOLN COMPARISON:  Radiographs May 12, 2021 FINDINGS: Cardiovascular: Aortic atherosclerosis without aneurysmal dilation. No central pulmonary embolus. Enlarged main pulmonary artery measuring 3 cm. Coronary artery calcifications. Mild cardiac enlargement. No significant pericardial  effusion/thickening. Mediastinum/Nodes: No discrete thyroid nodule. No pathologically enlarged mediastinal, hilar or axillary lymph nodes. Trachea is unremarkable. Large hiatal hernia/intrathoracic stomach. Lungs/Pleura: Small left pleural effusion with adjacent atelectasis. Dependent atelectasis in the right upper and lower lobes. No pneumothorax. Upper Abdomen: No acute abnormality. Musculoskeletal: Multiple left-sided rib fractures including a nondisplaced anterior 2nd rib fracture, nondisplaced anterolateral third rib fracture, minimally displaced lateral fourth fifth and sixth rib fractures, as well as minimally displaced posterior eighth ninth and tenth rib fractures. Prior median sternotomy. IMPRESSION: 1. Multiple left-sided rib fractures as described above. 2. Small left pleural effusion with adjacent compressive atelectasis as well as dependent atelectasis in the right lung. 3. Large hiatal hernia/intrathoracic stomach. 4. Enlarged main pulmonary artery, which can be seen with pulmonary arterial hypertension. 5.  Aortic Atherosclerosis (ICD10-I70.0). Electronically Signed   By: Maudry Mayhew MD   On: 05/13/2021 19:02   DG CHEST PORT 1 VIEW  Result Date: 05/12/2021 CLINICAL DATA:  Shortness of breath. EXAM: PORTABLE CHEST 1 VIEW COMPARISON:  Chest and left rib radiographs earlier today. FINDINGS: Patient's left rib fractures on rib radiographs earlier today are only faintly visualized. There is increasing hazy opacity throughout the left hemithorax likely increasing pleural effusion. No visualized pneumothorax. Patient is post median sternotomy. Cardiomegaly is similar. There is a large hiatal hernia. The right lung is clear. IMPRESSION: Increasing hazy opacity throughout the left hemithorax likely increasing pleural effusion. No visualized pneumothorax. Left rib fractures on rib radiographs earlier today are only faintly visualized. Electronically Signed   By: Narda Rutherford M.D.   On: 05/12/2021  17:35    Microbiology: Recent Results (from the past 240 hour(s))  Resp Panel by RT-PCR (Flu A&B, Covid) Nasopharyngeal Swab     Status: None   Collection Time: 05/12/21  1:48 AM   Specimen: Nasopharyngeal Swab; Nasopharyngeal(NP) swabs in vial transport medium  Result Value Ref Range Status   SARS Coronavirus 2 by RT PCR NEGATIVE NEGATIVE Final    Comment: (NOTE) SARS-CoV-2 target nucleic acids are NOT DETECTED.  The SARS-CoV-2 RNA is generally detectable in upper respiratory specimens during the acute phase of infection. The lowest concentration of SARS-CoV-2 viral copies this assay can detect is 138 copies/mL. A negative result does not preclude  SARS-Cov-2 infection and should not be used as the sole basis for treatment or other patient management decisions. A negative result may occur with  improper specimen collection/handling, submission of specimen other than nasopharyngeal swab, presence of viral mutation(s) within the areas targeted by this assay, and inadequate number of viral copies(<138 copies/mL). A negative result must be combined with clinical observations, patient history, and epidemiological information. The expected result is Negative.  Fact Sheet for Patients:  BloggerCourse.com  Fact Sheet for Healthcare Providers:  SeriousBroker.it  This test is no t yet approved or cleared by the [place] FDA and  has been authorized for detection and/or diagnosis of SARS-CoV-2 by FDA under an Emergency Use Authorization (EUA). This EUA will remain  in effect (meaning this test can be used) for the duration of the COVID-19 declaration under Section 564(b)(1) of the Act, 21 U.S.C.section 360bbb-3(b)(1), unless the authorization is terminated  or revoked sooner.       Influenza A by PCR NEGATIVE NEGATIVE Final   Influenza B by PCR NEGATIVE NEGATIVE Final    Comment: (NOTE) The Xpert Xpress SARS-CoV-2/FLU/RSV plus  assay is intended as an aid in the diagnosis of influenza from Nasopharyngeal swab specimens and should not be used as a sole basis for treatment. Nasal washings and aspirates are unacceptable for Xpert Xpress SARS-CoV-2/FLU/RSV testing.  Fact Sheet for Patients: BloggerCourse.com  Fact Sheet for Healthcare Providers: SeriousBroker.it  This test is not yet approved or cleared by the [place] FDA and has been authorized for detection and/or diagnosis of SARS-CoV-2 by FDA under an Emergency Use Authorization (EUA). This EUA will remain in effect (meaning this test can be used) for the duration of the COVID-19 declaration under Section 564(b)(1) of the Act, 21 U.S.C. section 360bbb-3(b)(1), unless the authorization is terminated or revoked.  Performed at [facility], [address], [place], [place] [address]   Resp Panel by RT-PCR (Flu A&B, Covid) Nasopharyngeal Swab     Status: None   Collection Time: 05/15/21  7:37 AM   Specimen: Nasopharyngeal Swab; Nasopharyngeal(NP) swabs in vial transport medium  Result Value Ref Range Status   SARS Coronavirus 2 by RT PCR NEGATIVE NEGATIVE Final    Comment: (NOTE) SARS-CoV-2 target nucleic acids are NOT DETECTED.  The SARS-CoV-2 RNA is generally detectable in upper respiratory specimens during the acute phase of infection. The lowest concentration of SARS-CoV-2 viral copies this assay can detect is 138 copies/mL. A negative result does not preclude SARS-Cov-2 infection and should not be used as the sole basis for treatment or other patient management decisions. A negative result may occur with  improper specimen collection/handling, submission of specimen other than nasopharyngeal swab, presence of viral mutation(s) within the areas targeted by this assay, and inadequate number of viral copies(<138 copies/mL). A negative result must be combined with clinical observations,  patient history, and epidemiological information. The expected result is Negative.  Fact Sheet for Patients:  BloggerCourse.com  Fact Sheet for Healthcare Providers:  SeriousBroker.it  This test is no t yet approved or cleared by the [place] FDA and  has been authorized for detection and/or diagnosis of SARS-CoV-2 by FDA under an Emergency Use Authorization (EUA). This EUA will remain  in effect (meaning this test can be used) for the duration of the COVID-19 declaration under Section 564(b)(1) of the Act, 21 U.S.C.section 360bbb-3(b)(1), unless the authorization is terminated  or revoked sooner.       Influenza A by PCR NEGATIVE NEGATIVE Final  Influenza B by PCR NEGATIVE NEGATIVE Final    Comment: (NOTE) The Xpert Xpress SARS-CoV-2/FLU/RSV plus assay is intended as an aid in the diagnosis of influenza from Nasopharyngeal swab specimens and should not be used as a sole basis for treatment. Nasal washings and aspirates are unacceptable for Xpert Xpress SARS-CoV-2/FLU/RSV testing.  Fact Sheet for Patients: BloggerCourse.comhttps://www.fda.gov/media/152166/download  Fact Sheet for Healthcare Providers: SeriousBroker.ithttps://www.fda.gov/media/152162/download  This test is not yet approved or cleared by the [place] FDA and has been authorized for detection and/or diagnosis of SARS-CoV-2 by FDA under an Emergency Use Authorization (EUA). This EUA will remain in effect (meaning this test can be used) for the duration of the COVID-19 declaration under Section 564(b)(1) of the Act, 21 U.S.C. section 360bbb-3(b)(1), unless the authorization is terminated or revoked.  Performed at [facility], [address] [place], [place] [id number]      Labs: CBC: Recent Labs  Lab 05/12/21 0050 05/12/21 0413 05/14/21 0126 05/15/21 0053  WBC 14.1* 13.0* 8.0 8.7  NEUTROABS 11.0*  --  4.9 5.6  HGB 12.1 12.8 11.4* 11.5*  HCT 38.8 41.7 36.2 37.5   MCV 86.4 87.8 86.4 87.2  PLT 359 330 270 311   Basic Metabolic Panel: Recent Labs  Lab 05/12/21 0050 05/14/21 0126 05/15/21 0053  NA 137 139 138  K 3.8 3.8 4.2  CL 105 102 103  CO2 23 30 30   GLUCOSE 124* 99 88  BUN 12 15 18   CREATININE 0.74 0.77 0.75  CALCIUM 8.9 9.0 9.1   Liver Function Tests: No results for input(s): AST, ALT, ALKPHOS, BILITOT, PROT, ALBUMIN in the last 168 hours. CBG: No results for input(s): GLUCAP in the last 168 hours.  Time spent: 35 minutes  Signed:  Lynden Oxfordranav Zanetta Dehaan  [facility]  05/15/2021

## 2021-05-15 NOTE — ED Provider Notes (Signed)
Emergency Medicine Provider Triage Evaluation Note  Alexandra Norman , a 65 y.o. female  was evaluated in triage.  Pt complains of rib pain. Pt was recently admitted after being dx with rib fractures. Was d/c today to [facility]. States her pain has not been under control and she no longer wants to stay at [facility]. She would rather be discharged to another facility or home with home health.  Review of Systems  Positive: Rib pain Negative: fever  Physical Exam  BP (!) 144/70 (BP Location: Left Arm)   Pulse 78   Temp 99.3 F (37.4 C)   Resp 18   SpO2 93%  Gen:   Awake, no distress   Resp:  Normal effort  MSK:   Moves extremities without difficulty   Medical Decision Making  Medically screening exam initiated at 6:41 PM.  Appropriate orders placed.  Apolonia Ellwood was informed that the remainder of the evaluation will be completed by another provider, this initial triage assessment does not replace that evaluation, and the importance of remaining in the ED until their evaluation is complete.     Rayne Du 05/15/21 1843    Terrilee Files, MD 05/16/21 1058

## 2021-05-15 NOTE — ED Triage Notes (Signed)
Pt was d/c from the hospital today to [facility] , pt has known rib fx and stated that her pain was not under control , [facility] gave her morphine

## 2021-05-15 NOTE — ED Provider Notes (Signed)
[facility] EMERGENCY DEPARTMENT Provider Note   CSN: [id number] Arrival date & time: 05/15/21  1833     History No chief complaint on file.   Alexandra Norman is a 65 y.o. female.  65 yo F recently discharged from our hospital for rib vractures. Went to [facility] where she was ignored for 3 hours and was not given pain medication in a few hours.  She also did not like living conditions so she returned here for help with home health.  States that nothing is changed in the last 6 hours and she was discharged.  She still has a rib pain.  She states that the lidocaine patch, Toradol and Percocet seem to help while she was here.   Past Medical History:  Diagnosis Date   ADD (attention deficit disorder)    Allergy    Arrhythmia    Arthritis    Charcot-Marie-Tooth disease    Depression    Fibromyalgia    Headache(784.0)    Heart murmur    Hyperlipidemia    Osteoarthritis    Osteoporosis    Rotator cuff tear    Thyroid disease    Ulcer    UTI (urinary tract infection)     Patient Active Problem List   Diagnosis Date Noted   Fall 05/13/2021   Rib fractures 05/12/2021   Leukocytosis 05/12/2021   Mood disorder (HCC) 05/12/2021   Hyperglycemia 07/09/2019   S/P ORIF (open reduction internal fixation) fracture 08/12/2016   Pain, wrist, left 08/09/2016   Constipation 04/29/2015   Encounter to establish care 04/29/2015   Peripheral neuropathy 09/07/2012   Charcot-Marie-Tooth disease 01/31/2012   Hypothyroid 01/31/2012   Atrial septal defect 01/31/2012   Hyperlipidemia 01/31/2012   GERD (gastroesophageal reflux disease) 01/31/2012   Osteoarthritis 01/31/2012   Osteoporosis 01/31/2012   Depression 01/31/2012   ADD (attention deficit disorder) 01/31/2012   Fibromyalgia 01/31/2012   Allergic rhinitis 01/31/2012    Past Surgical History:  Procedure Laterality Date   CARDIAC SURGERY  1982   ASD repair    CARPAL TUNNEL RELEASE  2007   bilateral    CATARACT EXTRACTION, BILATERAL Bilateral    FOOT SURGERY  2001 and 2005   bilateral   HERNIA REPAIR     WRIST SURGERY Left 08/12/2017     OB History   No obstetric history on file.     Family History  Problem Relation Age of Onset   Alcohol abuse Mother    Arthritis Mother    Mental illness Mother        Suicide in 67   Thyroid disease Mother    Alcohol abuse Father    Arthritis Father    Mental illness Sister        Suicide in 72   Breast cancer Sister     Social History   Tobacco Use   Smoking status: Never   Smokeless tobacco: Never  Vaping Use   Vaping Use: Never used  Substance Use Topics   Alcohol use: No    Alcohol/week: 0.0 standard drinks   Drug use: No    Home Medications Prior to Admission medications   Medication Sig Start Date End Date Taking? Authorizing Provider  baclofen (LIORESAL) 10 MG tablet Take 1 tablet (10 mg total) by mouth 3 (three) times daily. 05/15/21   Rolly Salter, MD  buPROPion (WELLBUTRIN XL) 150 MG 24 hr tablet Take 450 mg by mouth daily. 12/28/20   [provider]  calcium carbonate (  OSCAL) 1500 (600 Ca) MG TABS tablet Take 1,500 mg by mouth daily with breakfast.    [provider]  Coenzyme Q10 (CO Q10) 100 MG CAPS Take 100 mg by mouth daily.    [provider]  Cranberry 400 MG CAPS Take 400 mg by mouth daily.    [provider]  esomeprazole (NEXIUM) 40 MG capsule Take 1 capsule (40 mg total) by mouth daily before breakfast. 11/30/12   Burchette, Elberta Fortis, MD  fluticasone (FLONASE) 50 MCG/ACT nasal spray SPRAY 2 SPRAYS INTO EACH NOSTRIL EVERY DAY Patient taking differently: Place 2 sprays into both nostrils daily. 08/15/17   Burchette, Elberta Fortis, MD  gabapentin (NEURONTIN) 800 MG tablet TAKE 1 TABLET BY MOUTH FOUR TIMES A DAY Patient taking differently: Take 800 mg by mouth 4 (four) times daily. 01/12/21   Burchette, Elberta Fortis, MD  ketorolac (TORADOL) 10 MG tablet Take 1 tablet (10 mg total) by  mouth every 6 (six) hours as needed. 05/15/21   Mckala Pantaleon, Barbara Cower, MD  lamoTRIgine (LAMICTAL) 150 MG tablet Take 150 mg by mouth daily.  10/18/19   [provider]  Lido-Capsaicin-Men-Methyl Sal (MEDI-PATCH-LIDOCAINE) 0.5-0.035-5-20 % PTCH Apply 1 patch topically daily. 05/15/21   Anah Billard, Barbara Cower, MD  lidocaine (LIDODERM) 5 % Place 1 patch onto the skin daily. Remove & Discard patch within 12 hours or as directed by MD 05/15/21   Rolly Salter, MD  loratadine (CLARITIN) 10 MG tablet Take 10 mg by mouth daily.    [provider]  Menthol-Methyl Salicylate (MUSCLE RUB) 10-15 % CREA Apply 1 application topically as needed for muscle pain. 05/15/21   Rolly Salter, MD  Multiple Vitamins-Minerals (MULTIVITAMIN & MINERAL PO) Take 1 tablet by mouth daily.    [provider]  naproxen sodium (ALEVE) 220 MG tablet Take 220 mg by mouth daily as needed (pain).    [provider]  Nutritional Supplements (ADULT GROWTH HORMONE SUPPORT PO) Take 1 capsule by mouth daily.     [provider]  ondansetron (ZOFRAN) 4 MG tablet Take 4 mg by mouth every 8 (eight) hours as needed for vomiting or nausea. 11/01/16   [provider]  OXcarbazepine (TRILEPTAL) 150 MG tablet Take 150 mg by mouth 2 (two) times daily.    [provider]  oxyCODONE-acetaminophen (PERCOCET) 5-325 MG tablet Take 1-2 tablets by mouth every 4 (four) hours as needed. 05/15/21   Arshiya Jakes, Barbara Cower, MD  polyethylene glycol (MIRALAX / GLYCOLAX) 17 g packet Take 17 g by mouth daily. 05/15/21   Rolly Salter, MD  Probiotic Product (PROBIOTIC COLON SUPPORT) CAPS Take 1 capsule by mouth daily.    [provider]  QUEtiapine (SEROQUEL) 50 MG tablet Take 150 mg by mouth at bedtime.     [provider]  traMADol (ULTRAM) 50 MG tablet Take 2 tablets (100 mg total) by mouth 2 (two) times daily. 05/15/21   Rolly Salter, MD  triamcinolone cream (KENALOG) 0.1 % APPLY TO AFFECTED AREA EVERY DAY  03/02/17   Burchette, Elberta Fortis, MD  vitamin C (ASCORBIC ACID) 500 MG tablet Take 500 mg by mouth daily.    [provider]  VITAMIN D, CHOLECALCIFEROL, PO Take 2,000 Units by mouth daily.     [provider]    Allergies    Penicillins, Statins, and Sulfa antibiotics  Review of Systems   Review of Systems  All other systems reviewed and are negative.  Physical Exam Updated Vital Signs BP 114/71 (BP  Location: Right Arm)   Pulse 81   Temp 99.3 F (37.4 C)   Resp 18   SpO2 95%   Physical Exam Vitals and nursing note reviewed.  Constitutional:      Appearance: She is well-developed.  HENT:     Head: Normocephalic and atraumatic.     Mouth/Throat:     Mouth: Mucous membranes are moist.     Pharynx: Oropharynx is clear.  Eyes:     Pupils: Pupils are equal, round, and reactive to light.  Cardiovascular:     Rate and Rhythm: Normal rate and regular rhythm.  Pulmonary:     Effort: No respiratory distress.     Breath sounds: No stridor.  Abdominal:     General: Abdomen is flat. There is no distension.  Musculoskeletal:        General: No swelling or tenderness. Normal range of motion.     Cervical back: Normal range of motion.  Skin:    General: Skin is warm and dry.  Neurological:     General: No focal deficit present.     Mental Status: She is alert.    ED Results / Procedures / Treatments   Labs (all labs ordered are listed, but only abnormal results are displayed) Labs Reviewed - No data to display  EKG None  Radiology No results found.  Procedures Procedures   Medications Ordered in ED Medications  lidocaine (LIDODERM) 5 % 1 patch (1 patch Transdermal Patch Applied 05/15/21 2338)  ketorolac (TORADOL) injection 30 mg (30 mg Intramuscular Given 05/15/21 2335)    ED Course  I have reviewed the triage vital signs and the nursing notes.  Pertinent labs & imaging results that were available during my care of the patient were reviewed by me  and considered in my medical decision making (see chart for details).    MDM Rules/Calculators/A&P                          Does not want SNF or rehab, wants to go home with assistance (nurse friend is with her) [facility]. Face to Face orders placed. Rx given.   Final Clinical Impression(s) / ED Diagnoses Final diagnoses:  Rib pain  Closed fracture of multiple ribs, unspecified laterality, initial encounter    Rx / DC Orders ED Discharge Orders          Ordered    oxyCODONE-acetaminophen (PERCOCET) 5-325 MG tablet  Every 4 hours PRN,   Status:  Discontinued        05/15/21 2319    ketorolac (TORADOL) 10 MG tablet  Every 6 hours PRN,   Status:  Discontinued        05/15/21 2319    Lido-Capsaicin-Men-Methyl Sal (MEDI-PATCH-LIDOCAINE) 0.5-0.035-5-20 % PTCH  Daily,   Status:  Discontinued        05/15/21 2319    Home Health        05/15/21 2321    Face-to-face encounter (required for Medicare/Medicaid patients)       Comments: I Marily MemosJason Mukhtar Shams certify that this patient is under my care and that I, or a nurse practitioner or physician's assistant working with me, had a face-to-face encounter that meets the physician face-to-face encounter requirements with this patient on 05/15/2021. The encounter with the patient was in whole, or in part for the following medical condition(s) which is the primary reason for home health care (List medical condition): rib fractures. Pain. Further orders per PCP.  05/15/21 2321    oxyCODONE-acetaminophen (PERCOCET) 5-325 MG tablet  Every 4 hours PRN        05/15/21 2348    Lido-Capsaicin-Men-Methyl Sal (MEDI-PATCH-LIDOCAINE) 0.5-0.035-5-20 % PTCH  Daily        05/15/21 2348    ketorolac (TORADOL) 10 MG tablet  Every 6 hours PRN        05/15/21 2348             Domnique Vanegas, Barbara Cower, MD 05/16/21 0041

## 2021-05-18 ENCOUNTER — Encounter: Payer: Self-pay | Admitting: Family Medicine

## 2021-05-18 NOTE — Telephone Encounter (Signed)
FYI

## 2021-05-22 ENCOUNTER — Ambulatory Visit (INDEPENDENT_AMBULATORY_CARE_PROVIDER_SITE_OTHER): Payer: Medicare Other

## 2021-05-22 DIAGNOSIS — Z0000 Encounter for general adult medical examination without abnormal findings: Secondary | ICD-10-CM | POA: Diagnosis not present

## 2021-05-22 NOTE — Patient Instructions (Signed)
Alexandra Norman , Thank you for taking time to come for your Medicare Wellness Visit. I appreciate your ongoing commitment to your health goals. Please review the following plan we discussed and let me know if I can assist you in the future.   Screening recommendations/referrals: Colonoscopy: 07/29/2015  due 2026 Mammogram: patient declines  Bone Density: 11/23/2016 Recommended yearly ophthalmology/optometry visit for glaucoma screening and checkup Recommended yearly dental visit for hygiene and checkup  Vaccinations: Influenza vaccine: due in fall 2022  Pneumococcal vaccine: will consider  Tdap vaccine: 10/05/2011 Shingles vaccine: will consider     Advanced directives: copies in chart   Conditions/risks identified: none   Next appointment: none    Preventive Care 65 Years and Older, Female Preventive care refers to lifestyle choices and visits with your health care provider that can promote health and wellness. What does preventive care include? A yearly physical exam. This is also called an annual well check. Dental exams once or twice a year. Routine eye exams. Ask your health care provider how often you should have your eyes checked. Personal lifestyle choices, including: Daily care of your teeth and gums. Regular physical activity. Eating a healthy diet. Avoiding tobacco and drug use. Limiting alcohol use. Practicing safe sex. Taking low-dose aspirin every day. Taking vitamin and mineral supplements as recommended by your health care provider. What happens during an annual well check? The services and screenings done by your health care provider during your annual well check will depend on your age, overall health, lifestyle risk factors, and family history of disease. Counseling  Your health care provider may ask you questions about your: Alcohol use. Tobacco use. Drug use. Emotional well-being. Home and relationship well-being. Sexual activity. Eating  habits. History of falls. Memory and ability to understand (cognition). Work and work Astronomer. Reproductive health. Screening  You may have the following tests or measurements: Height, weight, and BMI. Blood pressure. Lipid and cholesterol levels. These may be checked every 5 years, or more frequently if you are over 90 years old. Skin check. Lung cancer screening. You may have this screening every year starting at age 33 if you have a 30-pack-year history of smoking and currently smoke or have quit within the past 15 years. Fecal occult blood test (FOBT) of the stool. You may have this test every year starting at age 62. Flexible sigmoidoscopy or colonoscopy. You may have a sigmoidoscopy every 5 years or a colonoscopy every 10 years starting at age 69. Hepatitis C blood test. Hepatitis B blood test. Sexually transmitted disease (STD) testing. Diabetes screening. This is done by checking your blood sugar (glucose) after you have not eaten for a while (fasting). You may have this done every 1-3 years. Bone density scan. This is done to screen for osteoporosis. You may have this done starting at age 61. Mammogram. This may be done every 1-2 years. Talk to your health care provider about how often you should have regular mammograms. Talk with your health care provider about your test results, treatment options, and if necessary, the need for more tests. Vaccines  Your health care provider may recommend certain vaccines, such as: Influenza vaccine. This is recommended every year. Tetanus, diphtheria, and acellular pertussis (Tdap, Td) vaccine. You may need a Td booster every 10 years. Zoster vaccine. You may need this after age 45. Pneumococcal 13-valent conjugate (PCV13) vaccine. One dose is recommended after age 38. Pneumococcal polysaccharide (PPSV23) vaccine. One dose is recommended after age 108. Talk to your health care  provider about which screenings and vaccines you need and how  often you need them. This information is not intended to replace advice given to you by your health care provider. Make sure you discuss any questions you have with your health care provider. Document Released: 10/17/2015 Document Revised: 06/09/2016 Document Reviewed: 07/22/2015 Elsevier Interactive Patient Education  2017 [employer] Prevention in the Home Falls can cause injuries. They can happen to people of all ages. There are many things you can do to make your home safe and to help prevent falls. What can I do on the outside of my home? Regularly fix the edges of walkways and driveways and fix any cracks. Remove anything that might make you trip as you walk through a door, such as a raised step or threshold. Trim any bushes or trees on the path to your home. Use bright outdoor lighting. Clear any walking paths of anything that might make someone trip, such as rocks or tools. Regularly check to see if handrails are loose or broken. Make sure that both sides of any steps have handrails. Any raised decks and porches should have guardrails on the edges. Have any leaves, snow, or ice cleared regularly. Use sand or salt on walking paths during winter. Clean up any spills in your garage right away. This includes oil or grease spills. What can I do in the bathroom? Use night lights. Install grab bars by the toilet and in the tub and shower. Do not use towel bars as grab bars. Use non-skid mats or decals in the tub or shower. If you need to sit down in the shower, use a plastic, non-slip stool. Keep the floor dry. Clean up any water that spills on the floor as soon as it happens. Remove soap buildup in the tub or shower regularly. Attach bath mats securely with double-sided non-slip rug tape. Do not have throw rugs and other things on the floor that can make you trip. What can I do in the bedroom? Use night lights. Make sure that you have a light by your bed that is easy to  reach. Do not use any sheets or blankets that are too big for your bed. They should not hang down onto the floor. Have a firm chair that has side arms. You can use this for support while you get dressed. Do not have throw rugs and other things on the floor that can make you trip. What can I do in the kitchen? Clean up any spills right away. Avoid walking on wet floors. Keep items that you use a lot in easy-to-reach places. If you need to reach something above you, use a strong step stool that has a grab bar. Keep electrical cords out of the way. Do not use floor polish or wax that makes floors slippery. If you must use wax, use non-skid floor wax. Do not have throw rugs and other things on the floor that can make you trip. What can I do with my stairs? Do not leave any items on the stairs. Make sure that there are handrails on both sides of the stairs and use them. Fix handrails that are broken or loose. Make sure that handrails are as long as the stairways. Check any carpeting to make sure that it is firmly attached to the stairs. Fix any carpet that is loose or worn. Avoid having throw rugs at the top or bottom of the stairs. If you do have throw rugs, attach them to the  floor with carpet tape. Make sure that you have a light switch at the top of the stairs and the bottom of the stairs. If you do not have them, ask someone to add them for you. What else can I do to help prevent falls? Wear shoes that: Do not have high heels. Have rubber bottoms. Are comfortable and fit you well. Are closed at the toe. Do not wear sandals. If you use a stepladder: Make sure that it is fully opened. Do not climb a closed stepladder. Make sure that both sides of the stepladder are locked into place. Ask someone to hold it for you, if possible. Clearly mark and make sure that you can see: Any grab bars or handrails. First and last steps. Where the edge of each step is. Use tools that help you move  around (mobility aids) if they are needed. These include: Canes. Walkers. Scooters. Crutches. Turn on the lights when you go into a dark area. Replace any light bulbs as soon as they burn out. Set up your furniture so you have a clear path. Avoid moving your furniture around. If any of your floors are uneven, fix them. If there are any pets around you, be aware of where they are. Review your medicines with your doctor. Some medicines can make you feel dizzy. This can increase your chance of falling. Ask your doctor what other things that you can do to help prevent falls. This information is not intended to replace advice given to you by your health care provider. Make sure you discuss any questions you have with your health care provider. Document Released: 07/17/2009 Document Revised: 02/26/2016 Document Reviewed: 10/25/2014 Elsevier Interactive Patient Education  2017 [employer].

## 2021-05-22 NOTE — Progress Notes (Signed)
Subjective:   Alexandra Norman is a 65 y.o. female who presents for Medicare Annual (Subsequent) preventive examination.  I connected with Alexandra Norman today by telephone and verified that I am speaking with the correct person using two identifiers. Location patient: home Location provider: work Persons participating in the virtual visit: patient, provider.   I discussed the limitations, risks, security and privacy concerns of performing an evaluation and management service by telephone and the availability of in person appointments. I also discussed with the patient that there may be a patient responsible charge related to this service. The patient expressed understanding and verbally consented to this telephonic visit.    Interactive audio and video telecommunications were attempted between this provider and patient, however failed, due to patient having technical difficulties OR patient did not have access to video capability.  We continued and completed visit with audio only.    Review of Systems    N/a       Objective:    There were no vitals filed for this visit. There is no height or weight on file to calculate BMI.  Advanced Directives 05/11/2021 02/08/2021 06/18/2020 05/21/2020 04/12/2016 07/29/2015 05/08/2015  Does Patient Have a Medical Advance Directive? Yes No Yes Yes No No No  Type of Advance Directive Out of facility DNR (pink MOST or yellow form);Healthcare Power of English as a second language teacher of [place];Living will Healthcare Power of [place];Living will - - -  Does patient want to make changes to medical advance directive? - - - No - Patient declined - - -  Copy of Healthcare Power of Attorney in Chart? - - - No - copy requested - - -  Would patient like information on creating a medical advance directive? - No - Patient declined - - No - patient declined information No - patient declined information No - patient declined information    Current Medications  (verified) Outpatient Encounter Medications as of 05/22/2021  Medication Sig   baclofen (LIORESAL) 10 MG tablet Take 1 tablet (10 mg total) by mouth 3 (three) times daily.   buPROPion (WELLBUTRIN XL) 150 MG 24 hr tablet Take 450 mg by mouth daily.   calcium carbonate (OSCAL) 1500 (600 Ca) MG TABS tablet Take 1,500 mg by mouth daily with breakfast.   Coenzyme Q10 (CO Q10) 100 MG CAPS Take 100 mg by mouth daily.   Cranberry 400 MG CAPS Take 400 mg by mouth daily.   esomeprazole (NEXIUM) 40 MG capsule Take 1 capsule (40 mg total) by mouth daily before breakfast.   fluticasone (FLONASE) 50 MCG/ACT nasal spray SPRAY 2 SPRAYS INTO EACH NOSTRIL EVERY DAY (Patient taking differently: Place 2 sprays into both nostrils daily.)   gabapentin (NEURONTIN) 800 MG tablet TAKE 1 TABLET BY MOUTH FOUR TIMES A DAY (Patient taking differently: Take 800 mg by mouth 4 (four) times daily.)   ketorolac (TORADOL) 10 MG tablet Take 1 tablet (10 mg total) by mouth every 6 (six) hours as needed.   lamoTRIgine (LAMICTAL) 150 MG tablet Take 150 mg by mouth daily.    Lido-Capsaicin-Men-Methyl Sal (MEDI-PATCH-LIDOCAINE) 0.5-0.035-5-20 % PTCH Apply 1 patch topically daily.   lidocaine (LIDODERM) 5 % Place 1 patch onto the skin daily. Remove & Discard patch within 12 hours or as directed by MD   loratadine (CLARITIN) 10 MG tablet Take 10 mg by mouth daily.   Menthol-Methyl Salicylate (MUSCLE RUB) 10-15 % CREA Apply 1 application topically as needed for muscle pain.   Multiple Vitamins-Minerals (MULTIVITAMIN & MINERAL  PO) Take 1 tablet by mouth daily.   naproxen sodium (ALEVE) 220 MG tablet Take 220 mg by mouth daily as needed (pain).   Nutritional Supplements (ADULT GROWTH HORMONE SUPPORT PO) Take 1 capsule by mouth daily.    ondansetron (ZOFRAN) 4 MG tablet Take 4 mg by mouth every 8 (eight) hours as needed for vomiting or nausea.   OXcarbazepine (TRILEPTAL) 150 MG tablet Take 150 mg by mouth 2 (two) times daily.    oxyCODONE-acetaminophen (PERCOCET) 5-325 MG tablet Take 1-2 tablets by mouth every 4 (four) hours as needed.   polyethylene glycol (MIRALAX / GLYCOLAX) 17 g packet Take 17 g by mouth daily.   Probiotic Product (PROBIOTIC COLON SUPPORT) CAPS Take 1 capsule by mouth daily.   QUEtiapine (SEROQUEL) 50 MG tablet Take 150 mg by mouth at bedtime.    traMADol (ULTRAM) 50 MG tablet Take 2 tablets (100 mg total) by mouth 2 (two) times daily.   triamcinolone cream (KENALOG) 0.1 % APPLY TO AFFECTED AREA EVERY DAY   vitamin C (ASCORBIC ACID) 500 MG tablet Take 500 mg by mouth daily.   VITAMIN D, CHOLECALCIFEROL, PO Take 2,000 Units by mouth daily.    No facility-administered encounter medications on file as of 05/22/2021.    Allergies (verified) Penicillins, Statins, and Sulfa antibiotics   History: Past Medical History:  Diagnosis Date   ADD (attention deficit disorder)    Allergy    Arrhythmia    Arthritis    Charcot-Marie-Tooth disease    Depression    Fibromyalgia    Headache(784.0)    Heart murmur    Hyperlipidemia    Osteoarthritis    Osteoporosis    Rotator cuff tear    Thyroid disease    Ulcer    UTI (urinary tract infection)    Past Surgical History:  Procedure Laterality Date   CARDIAC SURGERY  1982   ASD repair    CARPAL TUNNEL RELEASE  2007   bilateral   CATARACT EXTRACTION, BILATERAL Bilateral    FOOT SURGERY  2001 and 2005   bilateral   HERNIA REPAIR     WRIST SURGERY Left 08/12/2017   Family History  Problem Relation Age of Onset   Alcohol abuse Mother    Arthritis Mother    Mental illness Mother        Suicide in 31   Thyroid disease Mother    Alcohol abuse Father    Arthritis Father    Mental illness Sister        Suicide in 51   Breast cancer Sister    Social History   Socioeconomic History   Marital status: Divorced    Spouse name: Not on file   Number of children: 2   Years of education: Not on file   Highest education level: Not on file   Occupational History   Occupation: Sports coach  Tobacco Use   Smoking status: Never   Smokeless tobacco: Never  Vaping Use   Vaping Use: Never used  Substance and Sexual Activity   Alcohol use: No    Alcohol/week: 0.0 standard drinks   Drug use: No   Sexual activity: Not on file  Other Topics Concern   Not on file  Social History Narrative   Works for the post office, 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 Has two grandchildren    Social Determinants of Health   Financial Resource Strain: Low Risk  Difficulty of Paying Living Expenses: Not hard at all  Food Insecurity: No Food Insecurity   Worried About Running Out of Food in the Last Year: Never true   Ran Out of Food in the Last Year: Never true  Transportation Needs: No Transportation Needs   Lack of Transportation (Medical): No   Lack of Transportation (Non-Medical): No  Physical Activity: Inactive   Days of Exercise per Week: 0 days   Minutes of Exercise per Session: 0 min  Stress: No Stress Concern Present   Feeling of Stress : Not at all  Social Connections: Socially Isolated   Frequency of Communication with Friends and Family: Once a week   Frequency of Social Gatherings with Friends and Family: Once a week   Attends Religious Services: Never   Database administratorActive Member of Clubs or Organizations: Yes   Attends BankerClub or Organization Meetings: Never   Marital Status: Divorced    Tobacco Counseling Counseling given: Not Answered   Clinical Intake:                 Diabetic?no         Activities of Daily Living No flowsheet data found.  Patient Care Team: Kristian CoveyBurchette, Bruce W, MD as PCP - General (Family Medicine)  Indicate any recent Medical Services you may have received from other than [facility] providers in the past year (date may be approximate).     Assessment:   This is a routine wellness examination for Alexandra Norman.  Hearing/Vision screen No results  found.  Dietary issues and exercise activities discussed:     Goals Addressed   None    Depression Screen PHQ 2/9 Scores 05/21/2020 12/06/2018 04/29/2015  PHQ - 2 Score 2 6 5   PHQ- 9 Score 3 20 21     Fall Risk Fall Risk  05/21/2020  Falls in the past year? 1  Number falls in past yr: 0  Injury with Fall? 1  Comment broke ankle  Risk for fall due to : History of fall(s);Impaired mobility;Medication side effect  Follow up Falls evaluation completed;Falls prevention discussed    FALL RISK PREVENTION PERTAINING TO THE HOME:  Any stairs in or around the home? Yes  If so, are there any without handrails? Yes  Home free of loose throw rugs in walkways, pet beds, electrical cords, etc? Yes  Adequate lighting in your home to reduce risk of falls? Yes   ASSISTIVE DEVICES UTILIZED TO PREVENT FALLS:  Life alert? No  Use of a cane, walker or w/c? Yes  Grab bars in the bathroom? Yes  Shower chair or bench in shower? Yes  Elevated toilet seat or a handicapped toilet? Yes    Cognitive Function:  Normal cognitive status assessed by direct observation by this Nurse Health Advisor. No abnormalities found.     6CIT Screen 05/21/2020  What Year? 0 points  What month? 0 points  What time? 0 points  Count back from 20 0 points  Months in reverse 0 points  Repeat phrase 0 points  Total Score 0    Immunizations Immunization History  Administered Date(s) Administered   Influenza Split 07/12/2012   Influenza,inj,Quad PF,6+ Mos 07/10/2013, 08/06/2014, 11/24/2015, 09/14/2016, 06/14/2017, 09/05/2018, 07/09/2019   Influenza-Unspecified 06/22/2017   Moderna Sars-Covid-2 Vaccination 10/07/2020   PFIZER Comirnaty(Gray Top)Covid-19 Tri-Sucrose Vaccine 02/18/2021   PFIZER(Purple Top)SARS-COV-2 Vaccination 01/10/2020, 02/04/2020   Tdap 10/05/2011   Unspecified SARS-COV-2 Vaccination 01/10/2020, 02/04/2020    TDAP status: Up to date  Flu Vaccine status: Up to date  Pneumococcal vaccine  status: Due, Education has been provided regarding the importance of this vaccine. Advised may receive this vaccine at local pharmacy or Health Dept. Aware to provide a copy of the vaccination record if obtained from local pharmacy or Health Dept. Verbalized acceptance and understanding.  Covid-19 vaccine status: Completed vaccines  Qualifies for Shingles Vaccine? Yes   Zostavax completed No   Shingrix Completed?: No.    Education has been provided regarding the importance of this vaccine. Patient has been advised to call insurance company to determine out of pocket expense if they have not yet received this vaccine. Advised may also receive vaccine at local pharmacy or Health Dept. Verbalized acceptance and understanding.  Screening Tests Health Maintenance  Topic Date Due   Zoster Vaccines- Shingrix (1 of 2) Never done   PAP SMEAR-Modifier  10/04/2004   MAMMOGRAM  10/18/2005   PNA vac Low Risk Adult (1 of 2 - PCV13) Never done   INFLUENZA VACCINE  05/04/2021   COVID-19 Vaccine (5 - Booster) 06/21/2021   TETANUS/TDAP  10/04/2021   COLONOSCOPY (Pts 45-53yrs Insurance coverage will need to be confirmed)  07/28/2025   DEXA SCAN  Completed   Hepatitis C Screening  Completed   HIV Screening  Completed   HPV VACCINES  Aged Out    Health Maintenance  Health Maintenance Due  Topic Date Due   Zoster Vaccines- Shingrix (1 of 2) Never done   PAP SMEAR-Modifier  10/04/2004   MAMMOGRAM  10/18/2005   PNA vac Low Risk Adult (1 of 2 - PCV13) Never done   INFLUENZA VACCINE  05/04/2021    Colorectal cancer screening: Type of screening: Colonoscopy. Completed 07/29/2015. Repeat every 10 years  Mammogram status: Completed patient declines mammogram . Repeat every year  Bone Density status: Completed 11/23/2016. Results reflect: Bone density results: OSTEOPENIA. Repeat every 5 years.  Lung Cancer Screening: (Low Dose CT Chest recommended if Age 55-80 years, 30 pack-year currently smoking OR  have quit w/in 15years.) does not qualify.   Lung Cancer Screening Referral: n/a  Additional Screening:  Hepatitis C Screening: does not qualify; Completed 11/24/2015  Vision Screening: Recommended annual ophthalmology exams for early detection of glaucoma and other disorders of the eye. Is the patient up to date with their annual eye exam?  Yes  Who is the provider or what is the name of the office in which the patient attends annual eye exams? Dr. Orson Slick  If pt is not established with a provider, would they like to be referred to a provider to establish care? No .   Dental Screening: Recommended annual dental exams for proper oral hygiene  Community Resource Referral / Chronic Care Management: CRR required this visit?  No   CCM required this visit?  No      Plan:     I have personally reviewed and noted the following in the patient's chart:   Medical and social history Use of alcohol, tobacco or illicit drugs  Current medications and supplements including opioid prescriptions.  Functional ability and status Nutritional status Physical activity Advanced directives List of other physicians Hospitalizations, surgeries, and ER visits in previous 12 months Vitals Screenings to include cognitive, depression, and falls Referrals and appointments  In addition, I have reviewed and discussed with patient certain preventive protocols, quality metrics, and best practice recommendations. A written personalized care plan for preventive services as well as general preventive health recommendations were provided to patient.     March Rummage, LPN  05/22/2021   Nurse Notes: none

## 2021-06-05 ENCOUNTER — Ambulatory Visit: Payer: Medicare Other | Admitting: Family Medicine

## 2021-06-16 ENCOUNTER — Telehealth: Payer: Self-pay | Admitting: Internal Medicine

## 2021-06-16 NOTE — Telephone Encounter (Signed)
[facility] TELEPHONE ENCOUNTER NOTE  Patient: Alexandra Norman TSV:[id number]   PCP: Kristian Covey, MD DOB: Apr 06, 1956   DOS: 06/16/2021     Received a call from [facility] pharmacy with regarding baclofen, tramadol and percocet prescription that we sent with the pt on discharge on 05/15/2021.  Pt was d/c from the hospital to [facility].  Prescription were sent with SNF to facilitate the discharge in physical form. They were not intended to be filled at the pharmacy after her discharge from SNF.   Pt presented those prescription to be filled on 06/15/2021.  Given that the its almost 1 month since the pt was seen, I recommended to discontinue the prescription, The pt should follow up with PCP if they need any further medication assistance.  Author:  Lynden Oxford, MD [facility] 06/16/2021  To reach On-call, see [email or web address]

## 2021-07-04 ENCOUNTER — Other Ambulatory Visit: Payer: Self-pay | Admitting: Family Medicine

## 2021-07-06 NOTE — Telephone Encounter (Signed)
Last filled 05/15/2021 Last OV 02/18/2021  Ok to fill?

## 2021-07-06 NOTE — Telephone Encounter (Signed)
Left message for patient to call back  

## 2021-07-08 NOTE — Telephone Encounter (Signed)
ATC,voicemail is full °

## 2021-07-09 NOTE — Telephone Encounter (Signed)
I have been unable to reach the patient. Please deny this so that we can notify the pharmacy and hopefully they can reach out to her as well.

## 2021-07-10 ENCOUNTER — Other Ambulatory Visit: Payer: Self-pay

## 2021-07-10 ENCOUNTER — Ambulatory Visit (INDEPENDENT_AMBULATORY_CARE_PROVIDER_SITE_OTHER): Payer: Medicare Other | Admitting: Family Medicine

## 2021-07-10 VITALS — BP 124/60 | HR 75 | Temp 98.0°F | Wt 166.9 lb

## 2021-07-10 DIAGNOSIS — G600 Hereditary motor and sensory neuropathy: Secondary | ICD-10-CM

## 2021-07-10 DIAGNOSIS — G6289 Other specified polyneuropathies: Secondary | ICD-10-CM

## 2021-07-10 DIAGNOSIS — D649 Anemia, unspecified: Secondary | ICD-10-CM

## 2021-07-10 DIAGNOSIS — Z23 Encounter for immunization: Secondary | ICD-10-CM

## 2021-07-10 DIAGNOSIS — S2242XD Multiple fractures of ribs, left side, subsequent encounter for fracture with routine healing: Secondary | ICD-10-CM

## 2021-07-10 LAB — CBC WITH DIFFERENTIAL/PLATELET
Basophils Absolute: 0 10*3/uL (ref 0.0–0.1)
Basophils Relative: 0.6 % (ref 0.0–3.0)
Eosinophils Absolute: 0.2 10*3/uL (ref 0.0–0.7)
Eosinophils Relative: 2.5 % (ref 0.0–5.0)
HCT: 41 % (ref 36.0–46.0)
Hemoglobin: 13.2 g/dL (ref 12.0–15.0)
Lymphocytes Relative: 31.8 % (ref 12.0–46.0)
Lymphs Abs: 2.3 10*3/uL (ref 0.7–4.0)
MCHC: 32.1 g/dL (ref 30.0–36.0)
MCV: 85.7 fl (ref 78.0–100.0)
Monocytes Absolute: 0.4 10*3/uL (ref 0.1–1.0)
Monocytes Relative: 5 % (ref 3.0–12.0)
Neutro Abs: 4.3 10*3/uL (ref 1.4–7.7)
Neutrophils Relative %: 60.1 % (ref 43.0–77.0)
Platelets: 361 10*3/uL (ref 150.0–400.0)
RBC: 4.78 Mil/uL (ref 3.87–5.11)
RDW: 15.2 % (ref 11.5–15.5)
WBC: 7.2 10*3/uL (ref 4.0–10.5)

## 2021-07-10 MED ORDER — TRAMADOL HCL 50 MG PO TABS: ORAL_TABLET | ORAL | 2 refills | Status: DC

## 2021-07-10 MED ORDER — GABAPENTIN 800 MG PO TABS: 800.0000 mg | ORAL_TABLET | Freq: Four times a day (QID) | ORAL | 1 refills | Status: DC

## 2021-07-10 MED ORDER — BACLOFEN 10 MG PO TABS: 10.0000 mg | ORAL_TABLET | Freq: Three times a day (TID) | ORAL | 3 refills | Status: DC

## 2021-07-10 NOTE — Progress Notes (Signed)
Established Patient Office Visit  Subjective:  Patient ID: Alexandra Norman, female    DOB: 04-21-1956  Age: 65 y.o. MRN: [id number]  CC:  Chief Complaint  Patient presents with   Follow-up    HPI  Alexandra Norman presents for follow-up from recent ER visit and injuries related to fall that occurred at home.  She was in the bathroom and was in the shower and slipped.  She went to grab sidebar but had soap on her hand and her grip slipped and she fell down onto her left chest wall area.  She had some shortness of breath and initially had difficulty getting up.  She went to the ER and had fractures of the left second through sixth ribs.  She apparently had some hypoxemia and recommendation was that she go to rehab facility.  She was admitted for about 4 days because of the hypoxemia.  Chest CT did not show any pneumonia or pulmonary emboli.  She was discharged to rehab facility but had a very poor experience there and basically left after about 4 hours.  She went home with some assistance from a friend.  She has been using incentive spirometer regularly since then.  Still has some left chest wall pain but overall improving.  She had taken some oxycodone initially and has been transitioning back to Ultram.  She has history of Charcot-Marie-Tooth disease and chronic peripheral neuropathy.  She has osteoarthritis involving multiple joints.  Chronic back pain.  She takes tramadol and has been taking twice daily and requesting prescription for 3 times daily.  She has had poor control throughout the day with twice daily.  She also is requesting refills of baclofen which is helped her tremendously in the past.  She is on gabapentin and requests refills  She has history of anemia and was referred to GI but never went.  This was around the time of her fall.  She has gone on some iron replacement.  She is requesting follow-up CBC today.  Not noted any blood loss from stools or otherwise.  No  dizziness.    Past Medical History:  Diagnosis Date   ADD (attention deficit disorder)    Allergy    Arrhythmia    Arthritis    Charcot-Marie-Tooth disease    Depression    Fibromyalgia    Headache(784.0)    Heart murmur    Hyperlipidemia    Osteoarthritis    Osteoporosis    Rotator cuff tear    Thyroid disease    Ulcer    UTI (urinary tract infection)     Past Surgical History:  Procedure Laterality Date   CARDIAC SURGERY  1982   ASD repair    CARPAL TUNNEL RELEASE  2007   bilateral   CATARACT EXTRACTION, BILATERAL Bilateral    FOOT SURGERY  2001 and 2005   bilateral   HERNIA REPAIR     WRIST SURGERY Left 08/12/2017    Family History  Problem Relation Age of Onset   Alcohol abuse Mother    Arthritis Mother    Mental illness Mother        Suicide in 36   Thyroid disease Mother    Alcohol abuse Father    Arthritis Father    Mental illness Sister        Suicide in 58   Breast cancer Sister     Social History   Socioeconomic History   Marital status: Divorced    Spouse name: Not on file  Number of children: 2   Years of education: Not on file   Highest education level: Not on file  Occupational History   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 Has two grandchildren    Social Determinants of Corporate investment banker Strain: Low Risk    Difficulty of Paying Living Expenses: Not hard at all  Food Insecurity: No Food Insecurity   Worried About Programme researcher, broadcasting/film/video in the Last Year: Never true   Barista in the Last Year: Never true  Transportation Needs:  No Transportation Needs   Lack of Transportation (Medical): No   Lack of Transportation (Non-Medical): No  Physical Activity: Inactive   Days of Exercise per Week: 0 days   Minutes of Exercise per Session: 0 min  Stress: No Stress Concern Present   Feeling of Stress : Not at all  Social Connections: Socially Isolated   Frequency of Communication with Friends and Family: Three times a week   Frequency of Social Gatherings with Friends and Family: Three times a week   Attends Religious Services: Never   Active Member of Clubs or Organizations: No   Attends Banker Meetings: Never   Marital Status: Divorced  Catering manager Violence: Not At Risk   Fear of Current or Ex-Partner: No   Emotionally Abused: No   Physically Abused: No   Sexually Abused: No    Outpatient Medications Prior to Visit  Medication Sig Dispense Refill   buPROPion (WELLBUTRIN XL) 150 MG 24 hr tablet Take 450 mg by mouth daily.     calcium carbonate (OSCAL) 1500 (600 Ca) MG TABS tablet Take 1,500 mg by mouth daily with breakfast.     Coenzyme Q10 (CO Q10) 100 MG CAPS Take 100 mg by mouth daily.     Cranberry 400 MG CAPS Take 400 mg by mouth daily.     esomeprazole (NEXIUM) 40 MG capsule Take 1 capsule (40 mg total) by mouth daily before breakfast. 90 capsule 3   fluticasone (FLONASE) 50 MCG/ACT nasal spray SPRAY 2 SPRAYS INTO EACH NOSTRIL EVERY DAY (Patient taking differently: Place 2 sprays into both nostrils daily.) 16 g 2   lamoTRIgine (LAMICTAL) 150 MG tablet Take 150 mg by mouth daily.      Lido-Capsaicin-Men-Methyl Sal (MEDI-PATCH-LIDOCAINE) 0.5-0.035-5-20 % PTCH Apply 1 patch topically daily. 30 patch 0   lidocaine (LIDODERM) 5 % Place 1 patch onto the skin daily. Remove & Discard patch within 12 hours or as directed by MD 30 patch 0   loratadine (CLARITIN) 10 MG tablet Take 10 mg by mouth daily.     Menthol-Methyl Salicylate (MUSCLE RUB) 10-15 % CREA Apply 1 application topically as needed for  muscle pain. 85 g 0   Multiple Vitamins-Minerals (MULTIVITAMIN & MINERAL PO) Take 1 tablet by mouth daily.     naproxen sodium (ALEVE) 220 MG tablet Take 220 mg by mouth daily as needed (pain).     Nutritional Supplements (ADULT GROWTH HORMONE SUPPORT PO) Take  1 capsule by mouth daily.     ondansetron (ZOFRAN) 4 MG tablet Take 4 mg by mouth every 8 (eight) hours as needed for vomiting or nausea.     OXcarbazepine (TRILEPTAL) 150 MG tablet Take 150 mg by mouth 2 (two) times daily.     polyethylene glycol (MIRALAX / GLYCOLAX) 17 g packet Take 17 g by mouth daily. 14 each 0   Probiotic Product (PROBIOTIC COLON SUPPORT) CAPS Take 1 capsule by mouth daily.     QUEtiapine (SEROQUEL) 50 MG tablet Take 150 mg by mouth at bedtime.      triamcinolone cream (KENALOG) 0.1 % APPLY TO AFFECTED AREA EVERY DAY 30 g 0   vitamin C (ASCORBIC ACID) 500 MG tablet Take 500 mg by mouth daily.     VITAMIN D, CHOLECALCIFEROL, PO Take 2,000 Units by mouth daily.      gabapentin (NEURONTIN) 800 MG tablet TAKE 1 TABLET BY MOUTH FOUR TIMES A DAY (Patient taking differently: Take 800 mg by mouth 4 (four) times daily.) 360 tablet 1   ketorolac (TORADOL) 10 MG tablet Take 1 tablet (10 mg total) by mouth every 6 (six) hours as needed. 20 tablet 0   oxyCODONE-acetaminophen (PERCOCET) 5-325 MG tablet Take 1-2 tablets by mouth every 4 (four) hours as needed. 30 tablet 0   traMADol (ULTRAM) 50 MG tablet TAKE 2 TABLETS BY MOUTH TWICE A DAY 120 tablet 2   No facility-administered medications prior to visit.    Allergies  Allergen Reactions   Penicillins     hives   Statins    Sulfa Antibiotics     ROS Review of Systems  Constitutional:  Negative for fatigue.  Eyes:  Negative for visual disturbance.  Respiratory:  Negative for cough, chest tightness, shortness of breath and wheezing.   Cardiovascular:  Negative for chest pain, palpitations and leg swelling.  Musculoskeletal:  Positive for arthralgias and back pain.   Neurological:  Negative for dizziness, seizures, syncope, weakness, light-headedness and headaches.     Objective:    Physical Exam Constitutional:      Appearance: She is well-developed.  Eyes:     Pupils: Pupils are equal, round, and reactive to light.  Neck:     Thyroid: No thyromegaly.     Vascular: No JVD.  Cardiovascular:     Rate and Rhythm: Normal rate and regular rhythm.     Heart sounds:    No gallop.  Pulmonary:     Effort: Pulmonary effort is normal. No respiratory distress.     Breath sounds: Normal breath sounds. No wheezing or rales.  Musculoskeletal:     Cervical back: Neck supple.  Neurological:     Mental Status: She is alert.    BP 124/60 (BP Location: Left Arm, Patient Position: Sitting, Cuff Size: Normal)   Pulse 75   Temp 98 F (36.7 C) (Oral)   Wt 166 lb 14.4 oz (75.7 kg)   SpO2 98%   BMI 29.57 kg/m  Wt Readings from Last 3 Encounters:  07/10/21 166 lb 14.4 oz (75.7 kg)  05/11/21 180 lb (81.6 kg)  02/18/21 174 lb 8 oz (79.2 kg)     Health Maintenance Due  Topic Date Due   Zoster Vaccines- Shingrix (1 of 2) Never done   MAMMOGRAM  10/04/2002   PAP SMEAR-Modifier  10/04/2004   COVID-19 Vaccine (5 - Booster) 06/21/2021    There are no preventive care reminders to display for this patient.  Lab Results  Component Value  Date   TSH 5.05 (H) 11/17/2016   Lab Results  Component Value Date   WBC 8.7 05/15/2021   HGB 11.5 (L) 05/15/2021   HCT 37.5 05/15/2021   MCV 87.2 05/15/2021   PLT 311 05/15/2021   Lab Results  Component Value Date   NA 138 05/15/2021   K 4.2 05/15/2021   CO2 30 05/15/2021   GLUCOSE 88 05/15/2021   BUN 18 05/15/2021   CREATININE 0.75 05/15/2021   BILITOT 0.3 02/18/2021   ALKPHOS 99 02/18/2021   AST 10 02/18/2021   ALT 7 02/18/2021   PROT 6.4 02/18/2021   ALBUMIN 4.2 02/18/2021   CALCIUM 9.1 05/15/2021   ANIONGAP 5 05/15/2021   GFR 81.00 02/18/2021   Lab Results  Component Value Date   CHOL 305 (H)  11/10/2016   Lab Results  Component Value Date   HDL 37.40 (L) 11/10/2016   No results found for: [facility] Lab Results  Component Value Date   TRIG 313.0 (H) 11/10/2016   Lab Results  Component Value Date   CHOLHDL 8 11/10/2016   Lab Results  Component Value Date   HGBA1C 6.1 02/18/2021      Assessment & Plan:   #1 recent fall with multiple rib fractures left rib cage.  She had fractures of the second through sixth ribs.  Overall healing.  Pain greatly improved.  #2 history of chronic osteoarthritis pain.  She is on tramadol and refills given for 3 months -Also refill her baclofen  #3 history of anemia.  She was referred to GI but never went.  Recheck CBC.  Will recommend GI referral if hemoglobin not improved  #4 Charcot-Marie-Tooth disease with chronic neuropathy.  Refill gabapentin for 6 months   Meds ordered this encounter  Medications   baclofen (LIORESAL) 10 MG tablet    Sig: Take 1 tablet (10 mg total) by mouth 3 (three) times daily.    Dispense:  270 each    Refill:  3   gabapentin (NEURONTIN) 800 MG tablet    Sig: Take 1 tablet (800 mg total) by mouth 4 (four) times daily.    Dispense:  360 tablet    Refill:  1   traMADol (ULTRAM) 50 MG tablet    Sig: Take two tablets by mouth every 8 hours for chronic pain.    Dispense:  180 tablet    Refill:  2    Not to exceed 1 additional fills before 09/24/2021    Follow-up: No follow-ups on file.    Evelena Peat, MD

## 2021-08-03 ENCOUNTER — Telehealth: Payer: Self-pay | Admitting: Family Medicine

## 2021-08-03 NOTE — Chronic Care Management (AMB) (Signed)
  Chronic Care Management   Outreach Note  08/03/2021 Name: Cherre Kothari MRN: [id number] DOB: 03/10/1956  Referred by: Kristian Covey, MD Reason for referral : No chief complaint on file.   An unsuccessful telephone outreach was attempted today. The patient was referred to the pharmacist for assistance with care management and care coordination.   Follow Up Plan:   Tatjana Dellinger Upstream Scheduler

## 2021-08-19 ENCOUNTER — Telehealth: Payer: Self-pay | Admitting: Family Medicine

## 2021-08-19 NOTE — Chronic Care Management (AMB) (Signed)
  Chronic Care Management   Outreach Note  08/19/2021 Name: Alexandra Norman MRN: [id number] DOB: [birth year]  Referred by: Kristian Covey, MD Reason for referral : No chief complaint on file.   A second unsuccessful telephone outreach was attempted today. The patient was referred to pharmacist for assistance with care management and care coordination.  Follow Up Plan:   Tatjana Dellinger Upstream Scheduler

## 2021-08-21 ENCOUNTER — Encounter: Payer: Self-pay | Admitting: Gastroenterology

## 2021-08-25 ENCOUNTER — Telehealth: Payer: Self-pay | Admitting: Family Medicine

## 2021-08-25 NOTE — Chronic Care Management (AMB) (Signed)
  Chronic Care Management   Note  08/25/2021 Name: Haide Klinker MRN: [id number] DOB: 1956/09/19  Jezebel Pollet is a 65 y.o. year old female who is a primary care patient of Burchette, Elberta Fortis, MD. I reached out to Maryclare Bean by phone today in response to a referral sent by Ms. Al Decant PCP, Burchette, Elberta Fortis, MD.   Ms. Robley was given information about Chronic Care Management services today including:  CCM service includes personalized support from designated clinical staff supervised by her physician, including individualized plan of care and coordination with other care providers 24/7 contact phone numbers for assistance for urgent and routine care needs. Service will only be billed when office clinical staff spend 20 minutes or more in a month to coordinate care. Only one practitioner may furnish and bill the service in a calendar month. The patient may stop CCM services at any time (effective at the end of the month) by phone call to the office staff.   Patient agreed to services and verbal consent obtained.   Follow up plan:   Tatjana Restaurant manager, fast food

## 2021-08-31 ENCOUNTER — Telehealth: Payer: Self-pay | Admitting: Family Medicine

## 2021-08-31 NOTE — Telephone Encounter (Signed)
Disability Parking Placard form to be filled out--placed in dr's folder.  Upon completion, mail to:  [address] [place], [place] [address]

## 2021-08-31 NOTE — Telephone Encounter (Signed)
Form has been placed in Dr. Burchettes red folder.  °

## 2021-09-10 NOTE — Telephone Encounter (Signed)
Patient called to follow up on form needing to be filled out by Dr.Burchette. I let patient know that it had not been completed yet, so it has not been mailed. Patient request that it be mailed to address below and a phone call be place so she can be on the lookout for it.   Good callback number is [phone]   Mail to [address], [place], [place] [address]

## 2021-09-11 NOTE — Telephone Encounter (Signed)
Forms have been completed and placed in the mail. Patient has been made aware.

## 2021-10-07 ENCOUNTER — Ambulatory Visit (INDEPENDENT_AMBULATORY_CARE_PROVIDER_SITE_OTHER): Payer: Medicare Other | Admitting: Family Medicine

## 2021-10-07 VITALS — BP 124/68 | HR 73 | Temp 98.0°F | Wt 164.9 lb

## 2021-10-07 DIAGNOSIS — H6121 Impacted cerumen, right ear: Secondary | ICD-10-CM

## 2021-10-07 NOTE — Progress Notes (Signed)
Established Patient Office Visit  Subjective:  Patient ID: Alexandra Norman Cagley, female    DOB: 03-30-1956  Age: 66 y.o. MRN: [id number]  CC:  Chief Complaint  Patient presents with   Ear Pain    2 weeks, pt tried to get wax out & now it tender. Denies fever or ear drainage. Right ear.    HPI Alexandra Norman Lim presents for concern for cerumen impaction right canal.  She has had wax issues in the past.  She got a shower 1 day and use a Q-tip and felt like her hearing was diminished even more after that.  No bleeding.  She is tried some type of over-the-counter eardrops along with rubber bulb syringe without success.  Has noted some decreased hearing in the right ear since the wax lodged down into her canal.  Past Medical History:  Diagnosis Date   ADD (attention deficit disorder)    Allergy    Arrhythmia    Arthritis    Charcot-Marie-Tooth disease    Depression    Fibromyalgia    Headache(784.0)    Heart murmur    Hyperlipidemia    Osteoarthritis    Osteoporosis    Rotator cuff tear    Thyroid disease    Ulcer    UTI (urinary tract infection)     Past Surgical History:  Procedure Laterality Date   CARDIAC SURGERY  1982   ASD repair    CARPAL TUNNEL RELEASE  2007   bilateral   CATARACT EXTRACTION, BILATERAL Bilateral    FOOT SURGERY  2001 and 2005   bilateral   HERNIA REPAIR     WRIST SURGERY Left 08/12/2017    Family History  Problem Relation Age of Onset   Alcohol abuse Mother    Arthritis Mother    Mental illness Mother        Suicide in [id number]   Thyroid disease Mother    Alcohol abuse Father    Arthritis Father    Mental illness Sister        Suicide in [id number]   Breast cancer Sister     Social History   Socioeconomic History   Marital status: Divorced    Spouse name: Not on file   Number of children: 2   Years of education: Not on file   Highest education level: Not on file  Occupational History   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 Has two grandchildren    Social Determinants of Corporate investment bankerHealth   Financial Resource Strain: Low Risk    Difficulty of Paying Living Expenses: Not hard at all  Food Insecurity: No Food Insecurity   Worried About Programme researcher, broadcasting/film/videounning Out of Food in the Last Year: Never true   Baristaan Out of Food in the Last Year: Never true  Transportation Needs: No Transportation Needs   Lack of Transportation (Medical): No   Lack of Transportation (Non-Medical): No  Physical Activity: Inactive   Days of Exercise per Week: 0 days   Minutes of Exercise per  Session: 0 min  Stress: No Stress Concern Present   Feeling of Stress : Not at all  Social Connections: Socially Isolated   Frequency of Communication with Friends and Family: Three times a week   Frequency of Social Gatherings with Friends and Family: Three times a week   Attends Religious Services: Never   Active Member of Clubs or Organizations: No   Attends Banker Meetings: Never   Marital Status: Divorced  Catering manager Violence: Not At Risk   Fear of Current or Ex-Partner: No   Emotionally Abused: No   Physically Abused: No   Sexually Abused: No    Outpatient Medications Prior to Visit  Medication Sig Dispense Refill   baclofen (LIORESAL) 10 MG tablet Take 1 tablet (10 mg total) by mouth 3 (three) times daily. 270 each 3   buPROPion (WELLBUTRIN XL) 150 MG 24 hr tablet Take 450 mg by mouth daily.     calcium carbonate (OSCAL) 1500 (600 Ca) MG TABS tablet Take 1,500 mg by mouth daily with breakfast.     Coenzyme Q10 (CO Q10) 100 MG CAPS Take 100 mg by mouth daily.      Cranberry 400 MG CAPS Take 400 mg by mouth daily.     esomeprazole (NEXIUM) 40 MG capsule Take 1 capsule (40 mg total) by mouth daily before breakfast. 90 capsule 3   fluticasone (FLONASE) 50 MCG/ACT nasal spray SPRAY 2 SPRAYS INTO EACH NOSTRIL EVERY DAY (Patient taking differently: Place 2 sprays into both nostrils daily.) 16 g 2   gabapentin (NEURONTIN) 800 MG tablet Take 1 tablet (800 mg total) by mouth 4 (four) times daily. 360 tablet 1   lamoTRIgine (LAMICTAL) 150 MG tablet Take 150 mg by mouth daily.      Lido-Capsaicin-Men-Methyl Sal (MEDI-PATCH-LIDOCAINE) 0.5-0.035-5-20 % PTCH Apply 1 patch topically daily. 30 patch 0   lidocaine (LIDODERM) 5 % Place 1 patch onto the skin daily. Remove & Discard patch within 12 hours or as directed by MD 30 patch 0   loratadine (CLARITIN) 10 MG tablet Take 10 mg by mouth daily.     Menthol-Methyl Salicylate (MUSCLE RUB) 10-15 % CREA Apply 1 application topically as needed for muscle pain. 85 g 0   Multiple Vitamins-Minerals (MULTIVITAMIN & MINERAL PO) Take 1 tablet by mouth daily.     naproxen sodium (ALEVE) 220 MG tablet Take 220 mg by mouth daily as needed (pain).     Nutritional Supplements (ADULT GROWTH HORMONE SUPPORT PO) Take 1 capsule by mouth daily.     ondansetron (ZOFRAN) 4 MG tablet Take 4 mg by mouth every 8 (eight) hours as needed for vomiting or nausea.     OXcarbazepine (TRILEPTAL) 150 MG tablet Take 150 mg by mouth 2 (two) times daily.     polyethylene glycol (MIRALAX / GLYCOLAX) 17 g packet Take 17 g by mouth daily. 14 each 0   Probiotic Product (PROBIOTIC COLON SUPPORT) CAPS Take 1 capsule by mouth daily.     QUEtiapine (SEROQUEL) 50 MG tablet Take 150 mg by mouth at bedtime.      traMADol (ULTRAM) 50 MG tablet Take two tablets by mouth every 8 hours for chronic pain. 180 tablet 2   triamcinolone cream (KENALOG) 0.1 % APPLY TO AFFECTED AREA EVERY DAY 30 g 0   vitamin C (ASCORBIC ACID) 500 MG tablet Take 500 mg by mouth daily.     VITAMIN  D, CHOLECALCIFEROL, PO Take 2,000 Units by mouth daily.  propranolol (INDERAL) 10 MG tablet Take 10 mg by mouth 2 (two) times daily.     No facility-administered medications prior to visit.    Allergies  Allergen Reactions   Penicillins     hives   Statins    Sulfa Antibiotics     ROS Review of Systems  Constitutional:  Negative for chills and fever.  HENT:  Positive for hearing loss.   Neurological:  Negative for dizziness.     Objective:    Physical Exam Vitals reviewed.  Constitutional:      Appearance: Normal appearance.  HENT:     Ears:     Comments: Left TM and ear canal are clear.  Right canal reveals cerumen impaction.  Following irrigation of right canal cerumen fully removed.  Eardrum appears normal except for minimal erythema on the handle of the malleus.  No visible perforation.  No active bleeding. Neurological:     Mental Status: She is alert.    BP 124/68 (BP Location: Right Arm, Patient Position: Sitting, Cuff Size: Normal)    Pulse 73    Temp 98 F (36.7 C) (Oral)    Wt 164 lb 14.4 oz (74.8 kg)    SpO2 95%    BMI 29.21 kg/m  Wt Readings from Last 3 Encounters:  10/07/21 164 lb 14.4 oz (74.8 kg)  07/10/21 166 lb 14.4 oz (75.7 kg)  05/11/21 180 lb (81.6 kg)     Health Maintenance Due  Topic Date Due   Pneumonia Vaccine 63+ Years old (1 - PCV) Never done   Zoster Vaccines- Shingrix (1 of 2) Never done   MAMMOGRAM  10/04/2002   PAP SMEAR-Modifier  10/04/2004   TETANUS/TDAP  10/04/2021    There are no preventive care reminders to display for this patient.  Lab Results  Component Value Date   TSH 5.05 (H) 11/17/2016   Lab Results  Component Value Date   WBC 7.2 07/10/2021   HGB 13.2 07/10/2021   HCT 41.0 07/10/2021   MCV 85.7 07/10/2021   PLT 361.0 07/10/2021   Lab Results  Component Value Date   NA 138 05/15/2021   K 4.2 05/15/2021   CO2 30 05/15/2021   GLUCOSE 88 05/15/2021   BUN 18 05/15/2021   CREATININE 0.75 05/15/2021    BILITOT 0.3 02/18/2021   ALKPHOS 99 02/18/2021   AST 10 02/18/2021   ALT 7 02/18/2021   PROT 6.4 02/18/2021   ALBUMIN 4.2 02/18/2021   CALCIUM 9.1 05/15/2021   ANIONGAP 5 05/15/2021   GFR 81.00 02/18/2021   Lab Results  Component Value Date   CHOL 305 (H) 11/10/2016   Lab Results  Component Value Date   HDL 37.40 (L) 11/10/2016   No results found for: [facility] Lab Results  Component Value Date   TRIG 313.0 (H) 11/10/2016   Lab Results  Component Value Date   CHOLHDL 8 11/10/2016   Lab Results  Component Value Date   HGBA1C 6.1 02/18/2021      Assessment & Plan:   Cerumen impaction right ear canal.  Patient tried home remedies without success.  We discussed risk of irrigation of the canal including risk of bleeding, pain, low risk of perforation and patient consents.  Right ear canal successfully irrigated by nurse.  Patient tolerated well and had significant improvement in hearing afterwards.  Cerumen fully removed by irrigation.  No complications.  No orders of the defined types were placed in this encounter.   Follow-up: No follow-ups on file.  Carolann Littler, MD

## 2021-10-07 NOTE — Progress Notes (Signed)
Patient consent obtained. Irrigation with water and peroxide performed. Full view of tympanic membranes after procedure by this RN & MD. Patient tolerated procedure well.

## 2021-10-09 ENCOUNTER — Encounter: Payer: Self-pay | Admitting: Internal Medicine

## 2021-10-09 ENCOUNTER — Ambulatory Visit: Payer: Medicare Other | Admitting: Gastroenterology

## 2021-10-19 ENCOUNTER — Telehealth: Payer: Self-pay | Admitting: Pharmacist

## 2021-10-19 NOTE — Chronic Care Management (AMB) (Signed)
Chronic Care Management Pharmacy Assistant   Name: Alexandra Norman  MRN: [id number] DOB: Dec 01, 1955  Alexandra Norman is an 66 y.o. year old female who presents for her initial CCM visit with the clinical pharmacist.  Reason for Encounter: Chart prep for initial visit with Gaylord Shih the clinical pharmacist on 10/26/2021   Conditions to be addressed/monitored: HTN, HLD, Depression, GERD, Hypothyroidism, Osteoporosis, and Osteoarthritis  Recent office visits:  10/07/2021 Evelena Peat MD - Patient was seen for hearing loss due to cerumen impaction, right. No medication changes. No follow up noted.  07/10/2021 Evelena Peat MD - Patient was seen for Closed fracture of multiple ribs of left side with routine healing and additional issues. Started baclofen 10mg  three times daily. Increased Tramadol 50 mg to two tablets every 8 hours for chronic pain. Discontinue Toradol and Percocet. No follow up noted.  05/22/2021 05/24/2021 Viers LPN - Medicare annual wellness exam  Recent consult visits:  None  Hospital visits:   Patient was seen at [facility] on 05/15/2021 due to Rib pain and Closed fracture of multiple ribs. Discharged after 5 hours. New?Medications Started at [facility] Discharge:?? ketorolac (TORADOL)  lidocaine (LIDODERM) Medication Changes at Hospital Discharge: -No medication changes Medications Discontinued at Hospital Discharge: -No medications discontinued Medications that remain the same after Hospital Discharge:??  -All other medications will remain the same.     Admitted to [facility] on 05/11/2021 due to Rib fractures. Discharge date was 05/15/2021.  New?Medications Started at [facility] Discharge:?? lidocaine (LIDODERM) Muscle Rub oxyCODONE-acetaminophen (PERCOCET/ROXICET) polyethylene glycol (MIRALAX / GLYCOLAX) triamcinolone cream Medication Changes at Hospital Discharge: baclofen (LIORESAL) 10 mg three times  daily Medications Discontinued at Hospital Discharge: aspirin 325 MG tablet BAYER BACK & BODY PO doxycycline 100 MG tablet Medications that remain the same after Hospital Discharge:??  -All other medications will remain the same.    Medications: Outpatient Encounter Medications as of 10/19/2021  Medication Sig Note   baclofen (LIORESAL) 10 MG tablet Take 1 tablet (10 mg total) by mouth 3 (three) times daily.    buPROPion (WELLBUTRIN XL) 150 MG 24 hr tablet Take 450 mg by mouth daily.    calcium carbonate (OSCAL) 1500 (600 Ca) MG TABS tablet Take 1,500 mg by mouth daily with breakfast.    Coenzyme Q10 (CO Q10) 100 MG CAPS Take 100 mg by mouth daily.    Cranberry 400 MG CAPS Take 400 mg by mouth daily.    esomeprazole (NEXIUM) 40 MG capsule Take 1 capsule (40 mg total) by mouth daily before breakfast.    fluticasone (FLONASE) 50 MCG/ACT nasal spray SPRAY 2 SPRAYS INTO EACH NOSTRIL EVERY DAY (Patient taking differently: Place 2 sprays into both nostrils daily.)    gabapentin (NEURONTIN) 800 MG tablet Take 1 tablet (800 mg total) by mouth 4 (four) times daily.    lamoTRIgine (LAMICTAL) 150 MG tablet Take 150 mg by mouth daily.     Lido-Capsaicin-Men-Methyl Sal (MEDI-PATCH-LIDOCAINE) 0.5-0.035-5-20 % PTCH Apply 1 patch topically daily.    lidocaine (LIDODERM) 5 % Place 1 patch onto the skin daily. Remove & Discard patch within 12 hours or as directed by MD    loratadine (CLARITIN) 10 MG tablet Take 10 mg by mouth daily.    Menthol-Methyl Salicylate (MUSCLE RUB) 10-15 % CREA Apply 1 application topically as needed for muscle pain.    Multiple Vitamins-Minerals (MULTIVITAMIN & MINERAL PO) Take 1 tablet by mouth daily.    naproxen sodium (ALEVE) 220 MG tablet Take  220 mg by mouth daily as needed (pain).    Nutritional Supplements (ADULT GROWTH HORMONE SUPPORT PO) Take 1 capsule by mouth daily.    ondansetron (ZOFRAN) 4 MG tablet Take 4 mg by mouth every 8 (eight) hours as needed for vomiting or  nausea.    OXcarbazepine (TRILEPTAL) 150 MG tablet Take 150 mg by mouth 2 (two) times daily. 05/22/2021: Per patient only take one time a day    polyethylene glycol (MIRALAX / GLYCOLAX) 17 g packet Take 17 g by mouth daily.    Probiotic Product (PROBIOTIC COLON SUPPORT) CAPS Take 1 capsule by mouth daily.    propranolol (INDERAL) 10 MG tablet Take 10 mg by mouth 2 (two) times daily.    QUEtiapine (SEROQUEL) 50 MG tablet Take 150 mg by mouth at bedtime.  05/22/2021: Per patient taking 200mg     traMADol (ULTRAM) 50 MG tablet Take two tablets by mouth every 8 hours for chronic pain.    triamcinolone cream (KENALOG) 0.1 % APPLY TO AFFECTED AREA EVERY DAY    vitamin C (ASCORBIC ACID) 500 MG tablet Take 500 mg by mouth daily.    VITAMIN D, CHOLECALCIFEROL, PO Take 2,000 Units by mouth daily.     No facility-administered encounter medications on file as of 10/19/2021.  Fill History: BACLOFEN 10 MG TABLET 10/06/2021 90   GABAPENTIN 800 MG TABLET 10/09/2021 90   PROPRANOLOL 10 MG TABLET 09/27/2021 30   QUETIAPINE FUMARATE 200 MG TAB 08/04/2021 90   BUPROPION HCL XL 150 MG TABLET 07/04/2021 90   LAMOTRIGINE 150 MG TABLET 06/30/2021 90   TRAMADOL HCL 50 MG TABLET 09/14/2021 30    Have you seen any other providers since your last visit? Patient denies any recent visits.   Any changes in your medications or health? Patient denies any medication or health changes.   Any side effects from any medications? Patient denies any side effects from medications.   Do you have an symptoms or problems not managed by your medications? Patient denies any problems or symptoms not managed by medications.   Any concerns about your health right now? Patient denies any health concerns at this time.   Has your provider asked that you check blood pressure, blood sugar, or follow special diet at home? Patient denies.  Do you get any type of exercise on a regular basis? Patient denies due to being handicap  Can  you think of a goal you would like to reach for your health? Patient states she would like to not fall.  Do you have any problems getting your medications? Patient denies any issues getting medications.   Is there anything that you would like to discuss during the appointment? Patient denies.   Please bring medications and supplements to appointment   Care Gaps: AWV - completed 05/22/2021 Last BP - 124/68 on 10/07/2021 Pneumovax - never done Shingrix - never done Mammogram - overdue TDAP - overdue  Star Rating Drugs: None  12/05/2021 [facility]  Clinical Pharmacist Assistant [phone]

## 2021-10-21 ENCOUNTER — Telehealth: Payer: Self-pay | Admitting: Pharmacist

## 2021-10-21 NOTE — Chronic Care Management (AMB) (Signed)
° ° °  Chronic Care Management Pharmacy Assistant   Name: Magdeline Prange  MRN: [id number] DOB: 24-Nov-1955  10/26/2021 APPOINTMENT REMINDER  Maryclare Bean was reminded to have all medications, supplements and any blood glucose and blood pressure readings available for review with Gaylord Shih, Pharm. D, at her telephone visit on 10/26/2021 at 3:30.   Questions: Have you had any recent office visit or specialist visit outside of [facility]? Patient has seen by her psychiatrist recently  Are there any concerns you would like to discuss during your office visit? Patient denies any concerns.  Are you having any problems obtaining your medications? (Whether it pharmacy issues or cost) Patient denies any issues getting medications.   If patient has any PAP medications ask if they are having any problems getting their PAP medication or refill? 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  Care Gaps: AWV - completed 05/22/2021 Last BP - 124/68 on 10/07/2021 Pneumovax - never done Shingrix - never done Mammogram - overdue TDAP - overdue  Star Rating Drug: None  Any gaps in medications fill history? No  Inetta Fermo [facility]  Programme researcher, broadcasting/film/video [phone]

## 2021-10-26 ENCOUNTER — Ambulatory Visit (INDEPENDENT_AMBULATORY_CARE_PROVIDER_SITE_OTHER): Payer: Medicare Other | Admitting: Pharmacist

## 2021-10-26 DIAGNOSIS — E785 Hyperlipidemia, unspecified: Secondary | ICD-10-CM

## 2021-10-26 DIAGNOSIS — F3341 Major depressive disorder, recurrent, in partial remission: Secondary | ICD-10-CM

## 2021-10-26 NOTE — Progress Notes (Signed)
Chronic Care Management Pharmacy Note  11/03/2021 Name:  Alexandra Norman MRN:  [id number] DOB:  1956/01/07  Summary: LDL not at goal < 100 Pt is overdue for CPE and annual labs  Recommendations/Changes made from today's visit: -Recommend repeat lipid panel and consider PCSK9 therapy given concerns for muscle pain with other cholesterol medications -Recommend repeat A1c and TSH based on previously elevated results -Recommend repeat vitamin D level based on high dose supplementation -Recommended decreasing vitamin D intake to maximum 5000 units/day  Plan: Scheduled CPE in Feb Follow up after CPE  Subjective: Alexandra Norman is an 66 y.o. year old female who is a primary patient of Burchette, Alinda Sierras, MD.  The CCM team was consulted for assistance with disease management and care coordination needs.    Engaged with patient face to face for initial visit in response to provider referral for pharmacy case management and/or care coordination services.   Consent to Services:  The patient was given the following information about Chronic Care Management services today, agreed to services, and gave verbal consent: 1. CCM service includes personalized support from designated clinical staff supervised by the primary care provider, including individualized plan of care and coordination with other care providers 2. 24/7 contact phone numbers for assistance for urgent and routine care needs. 3. Service will only be billed when office clinical staff spend 20 minutes or more in a month to coordinate care. 4. Only one practitioner may furnish and bill the service in a calendar month. 5.The patient may stop CCM services at any time (effective at the end of the month) by phone call to the office staff. 6. The patient will be responsible for cost sharing (co-pay) of up to 20% of the service fee (after annual deductible is met). Patient agreed to services and consent obtained.  Patient Care  Team: Eulas Post, MD as PCP - General (Family Medicine) Viona Gilmore, [facility] as Pharmacist (Pharmacist)  Recent office visits: 10/07/2021 Carolann Littler MD - Patient was seen for hearing loss due to cerumen impaction, right. No medication changes. No follow up noted.   07/10/2021 Carolann Littler MD - Patient was seen for Closed fracture of multiple ribs of left side with routine healing and additional issues. Started baclofen 43m three times daily. Increased Tramadol 50 mg to two tablets every 8 hours for chronic pain. Discontinue Toradol and Percocet. No follow up noted.   05/22/2021 -Mickel BaasViers LPN - Medicare annual wellness exam  Recent consult visits: None  Hospital visits: [facility] due to Rib pain and Closed fracture of multiple ribs. Discharged after 5 hours. [facility] ketorolac (TORADOL)  lidocaine (LIDODERM) Medication Changes at Hospital Discharge: -No medication changes Medications Discontinued at Hospital Discharge: -No medications discontinued Medications that remain the same after Hospital Discharge:??  -All other medications will remain the same.       [facility]. Discharge date was 05/15/2021.  [facility] lidocaine (LIDODERM) Muscle Rub oxyCODONE-acetaminophen (PERCOCET/ROXICET) polyethylene glycol (MIRALAX / GLYCOLAX) triamcinolone cream Medication Changes at Hospital Discharge: baclofen (LIORESAL) 10 mg three times daily Medications Discontinued at Hospital Discharge: aspirin 325 MG tablet BAYER BACK & BODY PO doxycycline 100 MG tablet Medications that remain the same after Hospital Discharge:??  -All other medications will remain the same.    Objective:  Lab Results  Component Value Date   CREATININE  0.75 05/15/2021   BUN 18 05/15/2021   GFR  81.00 02/18/2021   GFRNONAA >60 05/15/2021   GFRAA >60 05/08/2015   NA 138 05/15/2021   K 4.2 05/15/2021   CALCIUM 9.1 05/15/2021   CO2 30 05/15/2021   GLUCOSE 88 05/15/2021    Lab Results  Component Value Date/Time   HGBA1C 6.1 02/18/2021 10:48 AM   HGBA1C 5.8 (A) 12/19/2019 02:27 PM   GFR 81.00 02/18/2021 10:48 AM   GFR 114.58 11/10/2016 01:59 PM    Last diabetic Eye exam:  Lab Results  Component Value Date/Time   HMDIABEYEEXA No Retinopathy 05/24/2019 12:00 AM    Last diabetic Foot exam: No results found for: HMDIABFOOTEX   Lab Results  Component Value Date   CHOL 305 (H) 11/10/2016   HDL 37.40 (L) 11/10/2016   LDLDIRECT 182.0 11/10/2016   TRIG 313.0 (H) 11/10/2016   CHOLHDL 8 11/10/2016    Hepatic Function Latest Ref Rng & Units 02/18/2021 11/10/2016 10/01/2013  Total Protein 6.0 - 8.3 g/dL 6.4 6.3 6.0  Albumin 3.5 - 5.2 g/dL 4.2 4.0 3.9  AST 0 - 37 U/L _0 ALT 0 - 35 U/L _1 Alk Phosphatase 39 - 117 U/L 99 88 71  Total Bilirubin 0.2 - 1.2 mg/dL 0.3 0.4 0.8  Bilirubin, Direct 0.0 - 0.3 mg/dL - 0.1 0.0    Lab Results  Component Value Date/Time   TSH 5.05 (H) 11/17/2016 03:51 PM   TSH 0.67 11/10/2016 01:59 PM   FREET4 0.68 11/17/2016 03:51 PM    CBC Latest Ref Rng & Units 07/10/2021 05/15/2021 05/14/2021  WBC 4.0 - 10.5 K/uL 7.2 8.7 8.0  Hemoglobin 12.0 - 15.0 g/dL 13.2 11.5(L) 11.4(L)  Hematocrit 36.0 - 46.0 % 41.0 37.5 36.2  Platelets 150.0 - 400.0 K/uL 361.0 311 270    No results found for: VD25OH  Clinical ASCVD: No  The ASCVD Risk score (Arnett DK, et al., 2019) failed to calculate for the following reasons:   Cannot find a previous HDL lab   Cannot find a previous total cholesterol lab    Depression screen [facility] 2/9 05/22/2021 05/22/2021 05/21/2020  Decreased Interest 0 0 1  Down, Depressed, Hopeless 0 0 1  PHQ - 2 Score 0 0 2  Altered sleeping - - 1  Tired, decreased energy - - 0  Change in appetite - - 0  Feeling bad or failure about  yourself  - - 0  Trouble concentrating - - 0  Moving slowly or fidgety/restless - - 0  Suicidal thoughts - - 0  PHQ-9 Score - - 3  Difficult doing work/chores - - Not difficult at all     Social History   Tobacco Use  Smoking Status Never  Smokeless Tobacco Never   BP Readings from Last 3 Encounters:  10/07/21 124/68  07/10/21 124/60  05/15/21 114/71   Pulse Readings from Last 3 Encounters:  10/07/21 73  07/10/21 75  05/15/21 81   Wt Readings from Last 3 Encounters:  10/07/21 164 lb 14.4 oz (74.8 kg)  07/10/21 166 lb 14.4 oz (75.7 kg)  05/11/21 180 lb (81.6 kg)   BMI Readings from Last 3 Encounters:  10/07/21 29.21 kg/m  07/10/21 29.57 kg/m  05/11/21 31.89 kg/m    Assessment/Interventions: Review of patient past medical history, allergies, medications, health status, including review of consultants reports, laboratory and other test data, was performed as part of comprehensive evaluation and provision of chronic care management services.  SDOH:  (Social Determinants of Health) assessments and interventions performed: Yes SDOH Interventions    Flowsheet Row Most Recent Value  SDOH Interventions   Financial Strain Interventions Intervention Not Indicated  Transportation Interventions Intervention Not Indicated      SDOH Screenings   Alcohol Screen: Low Risk    Last Alcohol Screening Score (AUDIT): 0  Depression (PHQ2-9): Low Risk    PHQ-2 Score: 0  Financial Resource Strain: Low Risk    Difficulty of Paying Living Expenses: Not very hard  Food Insecurity: No Food Insecurity   Worried About Charity fundraiser in the Last Year: Never true   Ran Out of Food in the Last Year: Never true  Housing: Low Risk    Last Housing Risk Score: 0  Physical Activity: Inactive   Days of Exercise per Week: 0 days   Minutes of Exercise per Session: 0 min  Social Connections: Socially Isolated   Frequency of Communication with Friends and Family: Three times a week    Frequency of Social Gatherings with Friends and Family: Three times a week   Attends Religious Services: Never   Active Member of Clubs or Organizations: No   Attends Archivist Meetings: Never   Marital Status: Divorced  Stress: No Stress Concern Present   Feeling of Stress : Not at all  Tobacco Use: Low Risk    Smoking Tobacco Use: Never   Smokeless Tobacco Use: Never   Passive Exposure: Not on file  Transportation Needs: No Transportation Needs   Lack of Transportation (Medical): No   Lack of Transportation (Non-Medical): No   Patient is excited because she going to [facility] (2 hour drive) and is going to see her best friend this week. She doesn't get to see her often since she moved.  Patient reports one of her biggest health concerns is her neuromuscular disease (Charcot-Marie-Tooth disease) as this effects her every day life. She recently was sitting on a chair in the shower and wants to be able to stand up in the shower but she fell and broke 4 ribs because of this disease and uneasiness in her muscles. She reports this is a big reason why she doesn't want to take certain medications because they can deteriorate muscle.   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 She retired 3 years ago after she fell at work and broke an ankle and arm. It took 2 surgeries and required permanent retirement and reports that she can still fall even with the use of a cane now.  Patient was a home body before COVID and didn't want to socialize. She is surrounded by people that love her (best friend and neighbor) and enjoys playing on her tablet and truly prefers to be at home. Patient is still able to get out and drive and has high top shoes that prevent her  ankle from rolling over. She also wears an ankle brace and knee brace.  Patient is not exercising much. Her PCP sent her to PT and after this she found ways to strengthen her legs. She is not diligent in doing these exercises often. Patient goes through periods of doing these exercises and then stops.  She had lost  some weight changing her snacking habits over the past year or so. She has switched to popcorn and pretzels instead of potato chips and candy. She has lost 20 lbs because of this and has made a big difference in her stability. She has kept the weight off for now 6 months.   Patient denies any problems with her medications. She reports she has an excellent psychiatrist and is very satisfied with her care. She has an extensive family history of mental health problems as well. Her mother committed suicide in March 1987 and her middle sister committed suicide in Feb 1990, and she is the youngest. Her last sister, Judeen Hammans tried to commit suicide in October last year. She also has a therapist when she needs to and they are working on replacing traumatic memories with happy memories. She reports winter is the worst for her depression as her kids live across the country and hasnt spoken with them for 6 years and she is meeting with her therapist every week.  CCM Care Plan  Allergies  Allergen Reactions   Penicillins     hives   Statins    Sulfa Antibiotics     Medications Reviewed Today     Reviewed by Viona Gilmore, [facility] (Pharmacist) on 10/26/21 at 1651  Med List Status: <None>   Medication Order Taking? Sig Documenting Provider Last Dose Status Informant  Ascorbic Acid (VITAMIN C) 1000 MG tablet [id number] Yes Take 1,000 mg by mouth daily. [provider] Taking Active Self           Med Note [place] Crane, MISTY D   Tue May 12, 2021  1:22 AM)    baclofen (LIORESAL) 10 MG tablet [id number] Yes Take 1 tablet (10 mg total) by mouth 3 (three) times daily. Eulas Post, MD Taking  Active   buPROPion (WELLBUTRIN XL) 150 MG 24 hr tablet [id number] Yes Take 450 mg by mouth daily. [provider] Taking Active Self  calcium carbonate (OSCAL) 1500 (600 Ca) MG TABS tablet [id number] Yes Take 1,500 mg by mouth daily with breakfast. [provider] Taking Active Self  Coenzyme Q10 (CO Q10) 100 MG CAPS [id number] Yes Take 100 mg by mouth daily. [provider] Taking Active Self  Cranberry 400 MG CAPS [id number] Yes Take 400 mg by mouth as needed. [provider] Taking Active Self  esomeprazole (NEXIUM) 40 MG capsule [id number] Yes Take 1 capsule (40 mg total) by mouth daily before breakfast.  Patient taking differently: Take 20 mg by mouth daily before breakfast.   Eulas Post, MD Taking Active Self  fluticasone (FLONASE) 50 MCG/ACT nasal spray [id number] Yes SPRAY 2 SPRAYS INTO EACH NOSTRIL EVERY DAY  Patient taking differently: Place 2 sprays into both nostrils daily.   Eulas Post, MD Taking Active   gabapentin (NEURONTIN) 800 MG tablet [id number] Yes Take 1 tablet (800 mg total) by mouth 4 (four) times daily. Eulas Post, MD Taking Active   lamoTRIgine (LAMICTAL) 150 MG tablet [id number] Yes Take 150 mg by mouth daily.  [provider] Taking Active Self  loratadine (CLARITIN) 10 MG tablet [id number] Yes Take 10 mg by mouth daily. [provider] Taking Active Self  Multiple Vitamins-Minerals (MULTIVITAMIN & MINERAL PO) [id number] Yes Take 1 tablet by mouth daily. [provider] Taking Active Self  naproxen sodium (ALEVE) 220 MG tablet [id number] Yes Take 220 mg by mouth daily as needed (pain). [provider] Taking Active Self  Nutritional Supplements (ADULT GROWTH HORMONE  SUPPORT PO) [id number]  Take 1 capsule by mouth daily. [provider]  Active   OXcarbazepine (TRILEPTAL) 150 MG tablet [id number] Yes Take 150 mg by mouth daily. [provider] Taking Active Self            Med Note Kipp Brood, MADELINE Forrest Moron Oct 26, 2021  3:53 PM)    Probiotic Product (PROBIOTIC COLON SUPPORT) CAPS [id number] No Take 1 capsule by mouth daily.  Patient not taking: Reported on 10/26/2021   [provider] Not Taking Active Self  propranolol (INDERAL) 10 MG tablet [id number] Yes Take 10 mg by mouth as needed. [provider] Taking Active   QUEtiapine (SEROQUEL) 200 MG tablet [id number] Yes Take 200 mg by mouth at bedtime. [provider] Taking Active Self           Med Note Kipp Brood, MADELINE Forrest Moron Oct 26, 2021  3:52 PM)    traMADol (ULTRAM) 50 MG tablet [id number] Yes Take two tablets by mouth every 8 hours for chronic pain. Eulas Post, MD Taking Active   VITAMIN D, CHOLECALCIFEROL, PO [id number] Yes Take 5,000 Units by mouth daily. [provider] Taking Active Self            Patient Active Problem List   Diagnosis Date Noted   Fall 05/13/2021   Rib fractures 05/12/2021   Leukocytosis 05/12/2021   Mood disorder ([place]) 05/12/2021   Hyperglycemia 07/09/2019   S/P ORIF (open reduction internal fixation) fracture 08/12/2016   Pain, wrist, left 08/09/2016   Constipation 04/29/2015   Encounter to establish care 04/29/2015   Peripheral neuropathy 09/07/2012   Charcot-Marie-Tooth disease 01/31/2012   Hypothyroid 01/31/2012   Atrial septal defect 01/31/2012   Hyperlipidemia 01/31/2012   GERD (gastroesophageal reflux disease) 01/31/2012   Osteoarthritis 01/31/2012   Osteoporosis 01/31/2012   Depression 01/31/2012   ADD (attention deficit disorder) 01/31/2012   Fibromyalgia 01/31/2012   Allergic rhinitis 01/31/2012    Immunization History  Administered Date(s) Administered   Fluad Quad(high Dose 65+) 07/10/2021   Influenza Split 07/12/2012   Influenza,inj,Quad PF,6+ Mos 07/10/2013, 08/06/2014, 11/24/2015, 09/14/2016, 06/14/2017, 09/05/2018, 07/09/2019   Influenza-Unspecified 06/22/2017   Moderna Sars-Covid-2 Vaccination  10/07/2020   PFIZER Comirnaty(Gray Top)Covid-19 Tri-Sucrose Vaccine 02/18/2021   PFIZER(Purple Top)SARS-COV-2 Vaccination 01/10/2020, 02/04/2020   Tdap 10/05/2011   Unspecified SARS-COV-2 Vaccination 01/10/2020, 02/04/2020    Conditions to be addressed/monitored:  Hyperlipidemia, GERD, Hypothyroidism, Depression, Osteoporosis, Osteoarthritis, and Allergic Rhinitis  Care Plan : [place]  Updates made by Viona Gilmore, [place] since 11/03/2021 12:00 AM     Problem: Problem: Hyperlipidemia, GERD, Hypothyroidism, Depression, Osteoporosis, Osteoarthritis, and Allergic Rhinitis      Long-Range Goal: Patient-Specific Goal   Start Date: 10/26/2021  Expected End Date: 10/26/2022  This Visit's Progress: On track  Priority: High  Note:   Current Barriers:  Unable to independently monitor therapeutic efficacy Unable to achieve control of cholesterol and osteoporosis   Pharmacist Clinical Goal(s):  Patient will achieve adherence to monitoring guidelines and medication adherence to achieve therapeutic efficacy achieve control of cholesterol and osteoporosis as evidenced by next lipid panel and DEXA scan  through collaboration with PharmD and provider.   Interventions: 1:1 collaboration with Eulas Post, MD regarding development and update of comprehensive plan of care as evidenced by provider attestation and co-signature Inter-disciplinary care team collaboration (see longitudinal plan of care) Comprehensive medication review performed; medication list updated in electronic medical record  Hyperlipidemia: (LDL goal <  100) -Uncontrolled -Current treatment: No medications -Medications previously tried: statins (pt does not want to take due to risk for muscle pain)  -Current dietary patterns: does not always limit these foods -Current exercise habits: minimal  -Educated on Cholesterol goals;  Benefits of statin for ASCVD risk reduction; Importance of limiting foods high  in cholesterol; -Counseled on diet and exercise extensively Recommended repeat lipid panel and consider PCSK9 inhibitor based on concerns for muscle pain.  Depression/Anxiety/Panic attacks (Goal: minimize symptoms) -Controlled -Current treatment: Lamotrigine 150 mg 1 tablet daily - Appropriate, Effective, Safe, Accessible Quetiapine 200 mg mg 1 tablet at bedtime - Appropriate, Effective, Safe, Accessible Oxcarbazepine 150 mg 1 tablet once daily - Appropriate, Effective, Safe, Accessible Bupropion XL 150 mg 3 tablets daily - Appropriate, Effective, Safe, Accessible Propranolol 10 mg 1 tablet twice daily as needed - Appropriate, Effective, Safe, Accessible -Medications previously tried/failed: several medications -PHQ9: 0 -GAD7: n/a -Educated on Benefits of medication for symptom control Benefits of cognitive-behavioral therapy with or without medication -Counseled on diet and exercise extensively Recommended to continue current medication  Osteoporosis (Goal prevent fractures) -Uncontrolled -Last DEXA Scan: 2018   T-Score femoral neck: -2.6  T-Score total hip: n/a  T-Score lumbar spine: -2.8  T-Score forearm radius: n/a  10-year probability of major osteoporotic fracture: n/a  10-year probability of hip fracture: n/a -Patient is a candidate for pharmacologic treatment due to T-Score < -2.5 in femoral neck and T-Score < -2.5 in lumbar spine -Current treatment  Caltrate carbonate 600 mg, 2000 units with vitamin D - 1 tablet with breakfast  Multivitamin (300 mg calcium, 1000 units vitamin D)  Vitamin D 5000 units daily - Appropriate, Effective, Query Safe, Accessible -Medications previously tried: Prolia (never started due to thoughts of possible risk for muscle loss), oral bisphosphonates (severe myalgias)  -Recommend [phone] units of vitamin D daily. Recommend 1200 mg of calcium daily from dietary and supplemental sources. Recommend weight-bearing and muscle strengthening exercises  for building and maintaining bone density. -Recommended decreasing vitamin D intake to maximum 5000 units/day. Recommend vitamin D level and repeat DEXA.  GERD/Hiatal hernia (Goal: minimize symptoms) -Controlled -Current treatment  Esomeprazole 20 mg 1 capsule daily - Appropriate, Effective, Safe, Accessible -Medications previously tried: unknown  -Counseled on non-pharmacologic management of symptoms such as elevating the head of your bed, avoiding eating 2-3 hours before bed, avoiding triggering foods such as acidic, spicy, or fatty foods, eating smaller meals, and wearing clothes that are loose around the waist.  Osteoarthritis/pain (Goal: minimize pain) -Not ideally controlled -Current treatment  Tramadol 50 mg 2 tablets every 8 hours as needed (not always 3 times a day) - Appropriate, Effective, Safe, Accessible Baclofen 10 mg 1 tablet three times daily - Appropriate, Effective, Query Safe, Accessible Gabapentin 800 mg 1 tablet 4 times daily - Appropriate, Effective, Query Safe, Accessible Naproxen 220 mg 1 tablet daily as needed - Appropriate, Effective, Query Safe, Accessible Lidocaine 5% patches as needed - Appropriate, Effective, Safe, Accessible Biofreeze as needed - Appropriate, Effective, Safe, Accessible -Medications previously tried: oxycodone -Recommended to continue current medication  Allergic rhinitis (Goal: minimize symptoms) -Controlled -Current treatment  Loratadine 10 mg 1 tablet daily - Appropriate, Effective, Safe, Accessible Fluticasone 50 mcg/act spray in both nostrils  - Appropriate, Effective, Safe, Accessible -Medications previously tried: none  -Counseled on avoidance of allergens.  Health Maintenance -Vaccine gaps: shingrix, PCV20, tetanus -Current therapy:  Colace 100 mg 1 capsule daily Miralax as needed Trimacinolone cream 0.1% as needed CoQ10 100 mg 1 capsule  daily Cranberry 400 mg 1 capsule daily - uses as needed Vitamin C 500 mg 1 tablet  daily (extra C) 1000 mg Magnesium glyconate 120 mg (x 2 capsules) Fish oil - iwi algae daily Nerve pain daily Osteo-bi flex with vitamin D 2000 units daily -Educated on Herbal supplement research is limited and benefits usually cannot be proven Cost vs benefit of each product must be carefully weighed by individual consumer Supplements may interfere with prescription drugs -Patient is satisfied with current therapy and denies issues -Recommended to continue current medication  Patient Goals/Self-Care Activities Patient will:  - take medications as prescribed as evidenced by patient report and record review target a minimum of 150 minutes of moderate intensity exercise weekly engage in dietary modifications by increasing dietary fiber intake  Follow Up Plan: The care management team will reach out to the patient again over the next 30 days.        Medication Assistance: None required.  Patient affirms current coverage meets needs.  Compliance/Adherence/Medication fill history: Care Gaps: Shingrix, PCV20, tetanus, mammogram  Star-Rating Drugs: None  Patient's preferred pharmacy is:  [id number]: [phone]: [phone] [id number] [place] [place]one: [phone]: [phone] pill box? Yes - using weekly Pt endorses 100% compliance  We discussed: Current pharmacy is preferred with insurance plan and patient is satisfied with pharmacy services Patient decided to: Continue current medication management strategy  Care Plan and Follow Up Patient Decision:  Patient agrees to Care Plan and Follow-up.  Plan: The care management team will reach out to the patient again over the next 30 days.  MaJeni SallesPharmD, BCBriarcliffharmacist [place]

## 2021-11-03 DIAGNOSIS — F3341 Major depressive disorder, recurrent, in partial remission: Secondary | ICD-10-CM

## 2021-11-03 DIAGNOSIS — E785 Hyperlipidemia, unspecified: Secondary | ICD-10-CM | POA: Diagnosis not present

## 2021-11-03 NOTE — Patient Instructions (Signed)
Hi Alexandra Norman,  It was great to get to meet you over the telephone! Below is a summary of some of the topics we discussed.   Please reach out to me if you have any questions or need anything before I reach back out!  Best, Alexandra Norman  Alexandra Norman, PharmD, [facility]   Visit Information   Goals Addressed   None    Patient Care Plan: CCM Pharmacy Care Plan     Problem Identified: Problem: Hyperlipidemia, GERD, Hypothyroidism, Depression, Osteoporosis, Osteoarthritis, and Allergic Rhinitis      Long-Range Goal: Patient-Specific Goal   Start Date: 10/26/2021  Expected End Date: 10/26/2022  This Visit's Progress: On track  Priority: High  Note:   Current Barriers:  Unable to independently monitor therapeutic efficacy Unable to achieve control of cholesterol and osteoporosis   Pharmacist Clinical Goal(s):  Patient will achieve adherence to monitoring guidelines and medication adherence to achieve therapeutic efficacy achieve control of cholesterol and osteoporosis as evidenced by next lipid panel and DEXA scan  through collaboration with PharmD and provider.   Interventions: 1:1 collaboration with Alexandra Post, MD regarding development and update of comprehensive plan of care as evidenced by provider attestation and co-signature Inter-disciplinary care team collaboration (see longitudinal plan of care) Comprehensive medication review performed; medication list updated in electronic medical record  Hyperlipidemia: (LDL goal < 100) -Uncontrolled -Current treatment: No medications -Medications previously tried: statins (pt does not want to take due to risk for muscle pain)  -Current dietary patterns: does not always limit these foods -Current exercise habits: minimal  -Educated on Cholesterol goals;  Benefits of statin for ASCVD risk reduction; Importance of limiting foods high in cholesterol; -Counseled on  diet and exercise extensively Recommended repeat lipid panel and consider PCSK9 inhibitor based on concerns for muscle pain.  Depression/Anxiety/Panic attacks (Goal: minimize symptoms) -Controlled -Current treatment: Lamotrigine 150 mg 1 tablet daily - Appropriate, Effective, Safe, Accessible Quetiapine 200 mg mg 1 tablet at bedtime - Appropriate, Effective, Safe, Accessible Oxcarbazepine 150 mg 1 tablet once daily - Appropriate, Effective, Safe, Accessible Bupropion XL 150 mg 3 tablets daily - Appropriate, Effective, Safe, Accessible Propranolol 10 mg 1 tablet twice daily as needed - Appropriate, Effective, Safe, Accessible -Medications previously tried/failed: several medications -PHQ9: 0 -GAD7: n/a -Educated on Benefits of medication for symptom control Benefits of cognitive-behavioral therapy with or without medication -Counseled on diet and exercise extensively Recommended to continue current medication  Osteoporosis (Goal prevent fractures) -Uncontrolled -Last DEXA Scan: 2018   T-Score femoral neck: -2.6  T-Score total hip: n/a  T-Score lumbar spine: -2.8  T-Score forearm radius: n/a  10-year probability of major osteoporotic fracture: n/a  10-year probability of hip fracture: n/a -Patient is a candidate for pharmacologic treatment due to T-Score < -2.5 in femoral neck and T-Score < -2.5 in lumbar spine -Current treatment  Caltrate carbonate 600 mg, 2000 units with vitamin D - 1 tablet with breakfast  Multivitamin (300 mg calcium, 1000 units vitamin D)  Vitamin D 5000 units daily - Appropriate, Effective, Query Safe, Accessible -Medications previously tried: Prolia (never started due to thoughts of possible risk for muscle loss), oral bisphosphonates (severe myalgias)  -Recommend [phone] units of vitamin D daily. Recommend 1200 mg of calcium daily from dietary and supplemental sources. Recommend weight-bearing and muscle strengthening exercises for building and maintaining  bone density. -Recommended decreasing vitamin D intake to maximum 5000 units/day. Recommend vitamin D level and repeat DEXA.  GERD/Hiatal hernia (Goal:  minimize symptoms) -Controlled -Current treatment  Alexandra Norman 20 mg 1 capsule daily - Appropriate, Effective, Safe, Accessible -Medications previously tried: unknown  -Counseled on non-pharmacologic management of symptoms such as elevating the head of your bed, avoiding eating 2-3 hours before bed, avoiding triggering foods such as acidic, spicy, or fatty foods, eating smaller meals, and wearing clothes that are loose around the waist.  Osteoarthritis/pain (Goal: minimize pain) -Not ideally controlled -Current treatment  Tramadol 50 mg 2 tablets every 8 hours as needed (not always 3 times a day) - Appropriate, Effective, Safe, Accessible Baclofen 10 mg 1 tablet three times daily - Appropriate, Effective, Query Safe, Accessible Gabapentin 800 mg 1 tablet 4 times daily - Appropriate, Effective, Query Safe, Accessible Naproxen 220 mg 1 tablet daily as needed - Appropriate, Effective, Query Safe, Accessible Lidocaine 5% patches as needed - Appropriate, Effective, Safe, Accessible Biofreeze as needed - Appropriate, Effective, Safe, Accessible -Medications previously tried: oxycodone -Recommended to continue current medication  Allergic rhinitis (Goal: minimize symptoms) -Controlled -Current treatment  Loratadine 10 mg 1 tablet daily - Appropriate, Effective, Safe, Accessible Fluticasone 50 mcg/act spray in both nostrils  - Appropriate, Effective, Safe, Accessible -Medications previously tried: none  -Counseled on avoidance of allergens.  Health Maintenance -Vaccine gaps: shingrix, PCV20, tetanus -Current therapy:  Colace 100 mg 1 capsule daily Miralax as needed Trimacinolone cream 0.1% as needed CoQ10 100 mg 1 capsule daily Cranberry 400 mg 1 capsule daily - uses as needed Vitamin C 500 mg 1 tablet daily (extra C) 1000  mg Magnesium glyconate 120 mg (x 2 capsules) Fish oil - iwi algae daily Nerve pain daily Osteo-bi flex with vitamin D 2000 units daily -Educated on Herbal supplement research is limited and benefits usually cannot be proven Cost vs benefit of each product must be carefully weighed by individual consumer Supplements may interfere with prescription drugs -Patient is satisfied with current therapy and denies issues -Recommended to continue current medication  Patient Goals/Self-Care Activities Patient will:  - take medications as prescribed as evidenced by patient report and record review target a minimum of 150 minutes of moderate intensity exercise weekly engage in dietary modifications by increasing dietary fiber intake  Follow Up Plan: The care management team will reach out to the patient again over the next 30 days.       Ms. Agudelo was given information about Chronic Care Management services today including:  CCM service includes personalized support from designated clinical staff supervised by her physician, including individualized plan of care and coordination with other care providers 24/7 contact phone numbers for assistance for urgent and routine care needs. Standard insurance, coinsurance, copays and deductibles apply for chronic care management only during months in which we provide at least 20 minutes of these services. Most insurances cover these services at 100%, however patients may be responsible for any copay, coinsurance and/or deductible if applicable. This service may help you avoid the need for more expensive face-to-face services. Only one practitioner may furnish and bill the service in a calendar month. The patient may stop CCM services at any time (effective at the end of the month) by phone call to the office staff.  Patient agreed to services and verbal consent obtained.   Patient verbalizes understanding of instructions and care plan provided today and  agrees to view in MyChart. Active MyChart status confirmed with patient.   The pharmacy team will reach out to the patient again over the next 30 days.   Verner Chol, [facility]

## 2021-11-16 ENCOUNTER — Ambulatory Visit (INDEPENDENT_AMBULATORY_CARE_PROVIDER_SITE_OTHER): Payer: Medicare Other | Admitting: Family Medicine

## 2021-11-16 VITALS — BP 128/80 | HR 80 | Temp 97.7°F | Wt 162.4 lb

## 2021-11-16 DIAGNOSIS — R7989 Other specified abnormal findings of blood chemistry: Secondary | ICD-10-CM | POA: Diagnosis not present

## 2021-11-16 DIAGNOSIS — Z5320 Procedure and treatment not carried out because of patient's decision for unspecified reasons: Secondary | ICD-10-CM

## 2021-11-16 DIAGNOSIS — R5383 Other fatigue: Secondary | ICD-10-CM

## 2021-11-16 DIAGNOSIS — R7303 Prediabetes: Secondary | ICD-10-CM | POA: Diagnosis not present

## 2021-11-16 DIAGNOSIS — E785 Hyperlipidemia, unspecified: Secondary | ICD-10-CM

## 2021-11-16 DIAGNOSIS — M810 Age-related osteoporosis without current pathological fracture: Secondary | ICD-10-CM | POA: Diagnosis not present

## 2021-11-16 LAB — T4, FREE: Free T4: 0.68 ng/dL (ref 0.60–1.60)

## 2021-11-16 LAB — HEMOGLOBIN A1C: Hgb A1c MFr Bld: 5.8 % (ref 4.6–6.5)

## 2021-11-16 LAB — VITAMIN D 25 HYDROXY (VIT D DEFICIENCY, FRACTURES): VITD: 57.32 ng/mL (ref 30.00–100.00)

## 2021-11-16 LAB — TSH: TSH: 6.54 u[IU]/mL — ABNORMAL HIGH (ref 0.35–5.50)

## 2021-11-16 NOTE — Patient Instructions (Signed)
Let me know if you change your mind regarding lipid medications.

## 2021-11-16 NOTE — Progress Notes (Signed)
Established Patient Office Visit  Subjective:  Patient ID: Alexandra Norman, female    DOB: January 18, 1956  Age: 66 y.o. MRN: [id number]  CC:  Chief Complaint  Patient presents with   Annual Exam    HPI Alexandra Norman presents initially scheduled for a "physical".  However, she has Medicare primary and already had Medicare wellness visit.  She is here today to discuss chronic medical issues.  Generally feels well.  She has Charcot-Marie-Tooth disease and ongoing musculoskeletal challenges related to that.  She did make some dietary changes during the past year has lost some weight.  Eating less high fat snack foods.  Feels better overall.    History of very high cholesterol.  She has had previous cholesterols over 300.  She also had chest CT back in August which showed aortic atherosclerosis without aneurysm.  Also incidental note of coronary artery calcifications.  Patient has had previous intolerance with statins.  There was some discussion of possible PCSK9 use but patient declines at this time and declines further lipid check  We also discussed her osteoporosis history.  Osteoporosis by prior DEXA scan 2018.  Patient had previous intolerance with bisphosphonates.  She declines other therapy such as Prolia and therefore does not wish to get further DEXA scans at this time.  Currently on high-dose vitamin D supplementation.  Does take calcium supplement with Caltrate.  No recent vitamin D level  Prior history of prediabetes range blood sugars.  No polyuria or polydipsia.  Recent weight loss as above due to her efforts.  Past history of reported "hypothyroidism ".  She was taken off thyroid medication and most of her TSH were in normal range.  She did have 1 TSH few years ago that came back around 5.  Past Medical History:  Diagnosis Date   ADD (attention deficit disorder)    Allergy    Arrhythmia    Arthritis    Charcot-Marie-Tooth disease    Depression    Fibromyalgia     Headache(784.0)    Heart murmur    Hyperlipidemia    Osteoarthritis    Osteoporosis    Rotator cuff tear    Thyroid disease    Ulcer    UTI (urinary tract infection)     Past Surgical History:  Procedure Laterality Date   CARDIAC SURGERY  1982   ASD repair    CARPAL TUNNEL RELEASE  2007   bilateral   CATARACT EXTRACTION, BILATERAL Bilateral    FOOT SURGERY  2001 and 2005   bilateral   HERNIA REPAIR     WRIST SURGERY Left 08/12/2017    Family History  Problem Relation Age of Onset   Alcohol abuse Mother    Arthritis Mother    Mental illness Mother        Suicide in 25   Thyroid disease Mother    Alcohol abuse Father    Arthritis Father    Mental illness Sister        Suicide in 55   Breast cancer Sister     Social History   Socioeconomic History   Marital status: Divorced    Spouse name: Not on file   Number of children: 2   Years of education: Not on file   Highest education level: Not on file  Occupational History   Occupation: Sports coach  Tobacco Use   Smoking status: Never   Smokeless tobacco: Never  Vaping Use   Vaping Use: Never used  Substance and Sexual Activity  Alcohol use: No    Alcohol/week: 0.0 standard drinks   Drug use: No   Sexual activity: Not on file  Other Topics Concern   Not on file  Social History Narrative   Works for the post office, 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 Has two grandchildren    Social Determinants of Corporate investment bankerHealth   Financial Resource Strain: Low Risk    Difficulty of Paying Living Expenses: Not very hard  Food Insecurity: No Food Insecurity   Worried About Programme researcher, broadcasting/film/videounning Out of Food in the Last Year: Never true   Ran Out of Food in the Last Year: Never true  Transportation Needs: No Transportation Needs   Lack of Transportation (Medical): No   Lack of Transportation (Non-Medical): No  Physical Activity: Inactive   Days of Exercise per Week: 0 days   Minutes of Exercise per  Session: 0 min  Stress: No Stress Concern Present   Feeling of Stress : Not at all  Social Connections: Socially Isolated   Frequency of Communication with Friends and Family: Three times a week   Frequency of Social Gatherings with Friends and Family: Three times a week   Attends Religious Services: Never   Active Member of Clubs or Organizations: No   Attends BankerClub or Organization Meetings: Never   Marital Status: Divorced  Catering managerntimate Partner Violence: Not At Risk   Fear of Current or Ex-Partner: No   Emotionally Abused: No   Physically Abused: No   Sexually Abused: No    Outpatient Medications Prior to Visit  Medication Sig Dispense Refill   Ascorbic Acid (VITAMIN C) 1000 MG tablet Take 1,000 mg by mouth daily.     baclofen (LIORESAL) 10 MG tablet Take 1 tablet (10 mg total) by mouth 3 (three) times daily. 270 each 3   buPROPion (WELLBUTRIN XL) 150 MG 24 hr tablet Take 450 mg by mouth daily.     calcium carbonate (OSCAL) 1500 (600 Ca) MG TABS tablet Take 1,500 mg by mouth daily with breakfast.     Coenzyme Q10 (CO Q10) 100 MG CAPS Take 100 mg by mouth daily.     Cranberry 400 MG CAPS Take 400 mg by mouth as needed.     esomeprazole (NEXIUM) 20 MG capsule Take 20 mg by mouth daily at 12 noon.     fluticasone (FLONASE) 50 MCG/ACT nasal spray SPRAY 2 SPRAYS INTO EACH NOSTRIL EVERY DAY (Patient taking differently: Place 2 sprays into both nostrils daily.) 16 g 2   gabapentin (NEURONTIN) 800 MG tablet Take 1 tablet (800 mg total) by mouth 4 (four) times daily. 360 tablet 1   lamoTRIgine (LAMICTAL) 150 MG tablet Take 150 mg by mouth daily.      loratadine (CLARITIN) 10 MG tablet Take 10 mg by mouth daily.     Multiple Vitamins-Minerals (MULTIVITAMIN & MINERAL PO) Take 1 tablet by mouth daily.     naproxen sodium (ALEVE) 220 MG tablet Take 220 mg by mouth daily as needed (pain).     Nutritional Supplements (ADULT GROWTH HORMONE SUPPORT PO) Take 1 capsule by mouth daily.     OXcarbazepine  (TRILEPTAL) 150 MG tablet Take 150 mg by mouth daily.     Probiotic Product (PROBIOTIC COLON SUPPORT) CAPS Take 1 capsule by mouth daily.     propranolol (INDERAL) 10 MG tablet Take 10 mg by mouth as needed.     QUEtiapine (SEROQUEL) 200 MG tablet Take 200 mg  by mouth at bedtime.     traMADol (ULTRAM) 50 MG tablet Take two tablets by mouth every 8 hours for chronic pain. 180 tablet 2   VITAMIN D, CHOLECALCIFEROL, PO Take 5,000 Units by mouth daily.     No facility-administered medications prior to visit.    Allergies  Allergen Reactions   Penicillins     hives   Statins    Sulfa Antibiotics    Wt Readings from Last 3 Encounters:  11/16/21 162 lb 6.4 oz (73.7 kg)  10/07/21 164 lb 14.4 oz (74.8 kg)  07/10/21 166 lb 14.4 oz (75.7 kg)     ROS Review of Systems  Constitutional:  Negative for unexpected weight change.  Eyes:  Negative for visual disturbance.  Respiratory:  Negative for cough, chest tightness, shortness of breath and wheezing.   Cardiovascular:  Negative for chest pain, palpitations and leg swelling.  Endocrine: Negative for polydipsia and polyuria.  Neurological:  Negative for dizziness, seizures, syncope, light-headedness and headaches.     Objective:      BP 128/80 (BP Location: Left Arm, Cuff Size: Normal)    Pulse 80    Temp 97.7 F (36.5 C) (Oral)    Wt 162 lb 6.4 oz (73.7 kg)    SpO2 97%    BMI 28.77 kg/m  Wt Readings from Last 3 Encounters:  11/16/21 162 lb 6.4 oz (73.7 kg)  10/07/21 164 lb 14.4 oz (74.8 kg)  07/10/21 166 lb 14.4 oz (75.7 kg)     Health Maintenance Due  Topic Date Due   Zoster Vaccines- Shingrix (1 of 2) Never done   MAMMOGRAM  10/04/2002   Pneumonia Vaccine 74+ Years old (1 - PCV) Never done   TETANUS/TDAP  10/04/2021    There are no preventive care reminders to display for this patient.  Lab Results  Component Value Date   TSH 5.05 (H) 11/17/2016   Lab Results  Component Value Date   WBC 7.2 07/10/2021   HGB 13.2  07/10/2021   HCT 41.0 07/10/2021   MCV 85.7 07/10/2021   PLT 361.0 07/10/2021   Lab Results  Component Value Date   NA 138 05/15/2021   K 4.2 05/15/2021   CO2 30 05/15/2021   GLUCOSE 88 05/15/2021   BUN 18 05/15/2021   CREATININE 0.75 05/15/2021   BILITOT 0.3 02/18/2021   ALKPHOS 99 02/18/2021   AST 10 02/18/2021   ALT 7 02/18/2021   PROT 6.4 02/18/2021   ALBUMIN 4.2 02/18/2021   CALCIUM 9.1 05/15/2021   ANIONGAP 5 05/15/2021   GFR 81.00 02/18/2021   Lab Results  Component Value Date   CHOL 305 (H) 11/10/2016   Lab Results  Component Value Date   HDL 37.40 (L) 11/10/2016   No results found for: [facility] Lab Results  Component Value Date   TRIG 313.0 (H) 11/10/2016   Lab Results  Component Value Date   CHOLHDL 8 11/10/2016   Lab Results  Component Value Date   HGBA1C 6.1 02/18/2021      Assessment & Plan:   #1 history of hyperlipidemia.  She had very high cholesterol in the past.  Evidence for aortic atherosclerosis on CT chest and incidental note of coronary calcifications.  Patient has had previous intolerance to statins.  We discussed possible PCSK9 inhibitor but she declines.  She declines further statin check at this time  #2 history of prediabetes range blood sugars.  She has made some positive dietary changes and has lost some weight.  Recheck A1c  #3 past history of abnormal TSH.  Does have some fatigue issues.  Recheck TSH and free T4  #4 history of osteoporosis.  Patient has not had DEXA scan in about 5 years.  She is on Caltrate and also takes vitamin D supplement.  Check 25-hydroxy vitamin D level.  We discussed other therapy such as Prolia but she declines at this time.   No orders of the defined types were placed in this encounter.   Follow-up: No follow-ups on file.    Evelena Peat, MD

## 2021-11-18 ENCOUNTER — Other Ambulatory Visit: Payer: Self-pay | Admitting: Family Medicine

## 2021-11-19 NOTE — Telephone Encounter (Signed)
Last filled 07/10/2021 Last OV 11/16/2021  Ok to fill?

## 2021-12-07 ENCOUNTER — Ambulatory Visit (INDEPENDENT_AMBULATORY_CARE_PROVIDER_SITE_OTHER): Payer: Medicare Other | Admitting: Family Medicine

## 2021-12-07 ENCOUNTER — Encounter: Payer: Self-pay | Admitting: Family Medicine

## 2021-12-07 VITALS — BP 138/70 | HR 77 | Temp 98.2°F | Ht 63.0 in | Wt 165.5 lb

## 2021-12-07 DIAGNOSIS — G600 Hereditary motor and sensory neuropathy: Secondary | ICD-10-CM

## 2021-12-07 DIAGNOSIS — M159 Polyosteoarthritis, unspecified: Secondary | ICD-10-CM

## 2021-12-07 DIAGNOSIS — M810 Age-related osteoporosis without current pathological fracture: Secondary | ICD-10-CM

## 2021-12-07 NOTE — Patient Instructions (Signed)
We will order the Prolia and check on insurance coverage.   ?

## 2021-12-07 NOTE — Progress Notes (Signed)
Established Patient Office Visit  Subjective:  Patient ID: Angelice Piech, female    DOB: Apr 22, 1956  Age: 66 y.o. MRN: [id number]  CC:  Chief Complaint  Patient presents with   Osteoporosis    HPI Chika Cichowski presents to discuss osteoporosis.  She had DEXA scan back in 2018 with T score -2.6 lumbar spine.  She had fall with multiple rib fractures during the past year and recently saw orthopedist with left great toe fracture.  This apparently occurred spontaneously when she was walking.  She felt sudden pain and this was not related to any trauma.  She went in for x-rays and eventually had MRI scan per orthopedist which showed hairline fracture of the proximal phalanx of the left great toe.  Patient was placed on Fosamax years ago but had significant myalgias and unable to maintain this.  She was started on Prolia and did receive 1 injection and tolerated well but had issues with cost and unable to maintain this.  She would like to reexplore whether she can get covered to be on Prolia at this time.  She does have increased fall risk with her history of Charcot-Marie-Tooth disease.  Recent vitamin D level 57.  She is on calcium supplement with Caltrate.  Non-smoker and no history of smoking.  No alcohol.  She does have osteoarthritis, hyperlipidemia, history of peripheral neuropathy.   She takes tramadol as needed for arthritis pain and that seems you working fairly well.  Also maintained on gabapentin for her neuropathy.  Past Medical History:  Diagnosis Date   ADD (attention deficit disorder)    Allergy    Arrhythmia    Arthritis    Charcot-Marie-Tooth disease    Depression    Fibromyalgia    Headache(784.0)    Heart murmur    Hyperlipidemia    Osteoarthritis    Osteoporosis    Rotator cuff tear    Thyroid disease    Ulcer    UTI (urinary tract infection)     Past Surgical History:  Procedure Laterality Date   CARDIAC SURGERY  1982   ASD repair    CARPAL TUNNEL  RELEASE  2007   bilateral   CATARACT EXTRACTION, BILATERAL Bilateral    FOOT SURGERY  2001 and 2005   bilateral   HERNIA REPAIR     WRIST SURGERY Left 08/12/2017    Family History  Problem Relation Age of Onset   Alcohol abuse Mother    Arthritis Mother    Mental illness Mother        Suicide in 64   Thyroid disease Mother    Alcohol abuse Father    Arthritis Father    Mental illness Sister        Suicide in 71   Breast cancer Sister     Social History   Socioeconomic History   Marital status: Divorced    Spouse name: Not on file   Number of children: 2   Years of education: Not on file   Highest education level: Not on file  Occupational History   Occupation: Sports coach  Tobacco Use   Smoking status: Never   Smokeless tobacco: Never  Vaping Use   Vaping Use: Never used  Substance and Sexual Activity   Alcohol use: No    Alcohol/week: 0.0 standard drinks   Drug use: No   Sexual activity: Not on file  Other Topics Concern   Not on file  Social History Narrative   Works for the  post office, 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 Has two grandchildren    Social Determinants of Corporate investment bankerHealth   Financial Resource Strain: Low Risk    Difficulty of Paying Living Expenses: Not very hard  Food Insecurity: No Food Insecurity   Worried About Programme researcher, broadcasting/film/videounning Out of Food in the Last Year: Never true   Ran Out of Food in the Last Year: Never true  Transportation Needs: No Transportation Needs   Lack of Transportation (Medical): No   Lack of Transportation (Non-Medical): No  Physical Activity: Inactive   Days of Exercise per Week: 0 days   Minutes of Exercise per Session: 0 min  Stress: No Stress Concern Present   Feeling of Stress : Not at all  Social Connections: Socially Isolated   Frequency of Communication with Friends and Family: Three times a week   Frequency of Social Gatherings with Friends and Family: Three times a week   Attends  Religious Services: Never   Active Member of Clubs or Organizations: No   Attends BankerClub or Organization Meetings: Never   Marital Status: Divorced  Catering managerntimate Partner Violence: Not At Risk   Fear of Current or Ex-Partner: No   Emotionally Abused: No   Physically Abused: No   Sexually Abused: No    Outpatient Medications Prior to Visit  Medication Sig Dispense Refill   Ascorbic Acid (VITAMIN C) 1000 MG tablet Take 1,000 mg by mouth daily.     baclofen (LIORESAL) 10 MG tablet Take 1 tablet (10 mg total) by mouth 3 (three) times daily. 270 each 3   buPROPion (WELLBUTRIN XL) 150 MG 24 hr tablet Take 450 mg by mouth daily.     calcium carbonate (OSCAL) 1500 (600 Ca) MG TABS tablet Take 1,500 mg by mouth daily with breakfast.     Coenzyme Q10 (CO Q10) 100 MG CAPS Take 100 mg by mouth daily.     Cranberry 400 MG CAPS Take 400 mg by mouth as needed.     esomeprazole (NEXIUM) 20 MG capsule Take 20 mg by mouth daily at 12 noon.     fluticasone (FLONASE) 50 MCG/ACT nasal spray SPRAY 2 SPRAYS INTO EACH NOSTRIL EVERY DAY (Patient taking differently: Place 2 sprays into both nostrils daily.) 16 g 2   gabapentin (NEURONTIN) 800 MG tablet Take 1 tablet (800 mg total) by mouth 4 (four) times daily. 360 tablet 1   lamoTRIgine (LAMICTAL) 150 MG tablet Take 150 mg by mouth daily.      loratadine (CLARITIN) 10 MG tablet Take 10 mg by mouth daily.     Multiple Vitamins-Minerals (MULTIVITAMIN & MINERAL PO) Take 1 tablet by mouth daily.     naproxen sodium (ALEVE) 220 MG tablet Take 220 mg by mouth daily as needed (pain).     Nutritional Supplements (ADULT GROWTH HORMONE SUPPORT PO) Take 1 capsule by mouth daily.     OXcarbazepine (TRILEPTAL) 150 MG tablet Take 150 mg by mouth daily.     Probiotic Product (PROBIOTIC COLON SUPPORT) CAPS Take 1 capsule by mouth daily.     propranolol (INDERAL) 10 MG tablet Take 10 mg by mouth as needed.     QUEtiapine (SEROQUEL) 200 MG tablet Take 200 mg by mouth at bedtime.      traMADol (ULTRAM) 50 MG tablet TAKE TWO TABLETS BY MOUTH EVERY 8 HOURS FOR CHRONIC PAIN. 180 tablet 2   VITAMIN D, CHOLECALCIFEROL, PO Take 5,000 Units by mouth daily.  No facility-administered medications prior to visit.    Allergies  Allergen Reactions   Penicillins     hives   Statins    Sulfa Antibiotics     ROS Review of Systems  Constitutional:  Negative for chills and fever.  Respiratory:  Negative for shortness of breath.   Cardiovascular:  Negative for chest pain.  Genitourinary:  Negative for dysuria.     Objective:    Physical Exam Vitals reviewed.  Cardiovascular:     Rate and Rhythm: Normal rate and regular rhythm.  Pulmonary:     Effort: Pulmonary effort is normal.     Breath sounds: Normal breath sounds.  Musculoskeletal:     Right lower leg: No edema.     Left lower leg: No edema.  Neurological:     Mental Status: She is alert.    BP 138/70 (BP Location: Left Arm, Patient Position: Sitting, Cuff Size: Normal)    Pulse 77    Temp 98.2 F (36.8 C) (Oral)    Ht 5\' 3"  (1.6 m)    Wt 165 lb 8 oz (75.1 kg)    SpO2 95%    BMI 29.32 kg/m  Wt Readings from Last 3 Encounters:  12/07/21 165 lb 8 oz (75.1 kg)  11/16/21 162 lb 6.4 oz (73.7 kg)  10/07/21 164 lb 14.4 oz (74.8 kg)     Health Maintenance Due  Topic Date Due   Zoster Vaccines- Shingrix (1 of 2) Never done   MAMMOGRAM  10/04/2002   Pneumonia Vaccine 64+ Years old (1 - PCV) Never done   TETANUS/TDAP  10/04/2021    There are no preventive care reminders to display for this patient.  Lab Results  Component Value Date   TSH 6.54 (H) 11/16/2021   Lab Results  Component Value Date   WBC 7.2 07/10/2021   HGB 13.2 07/10/2021   HCT 41.0 07/10/2021   MCV 85.7 07/10/2021   PLT 361.0 07/10/2021   Lab Results  Component Value Date   NA 138 05/15/2021   K 4.2 05/15/2021   CO2 30 05/15/2021   GLUCOSE 88 05/15/2021   BUN 18 05/15/2021   CREATININE 0.75 05/15/2021   BILITOT 0.3 02/18/2021    ALKPHOS 99 02/18/2021   AST 10 02/18/2021   ALT 7 02/18/2021   PROT 6.4 02/18/2021   ALBUMIN 4.2 02/18/2021   CALCIUM 9.1 05/15/2021   ANIONGAP 5 05/15/2021   GFR 81.00 02/18/2021   Lab Results  Component Value Date   CHOL 305 (H) 11/10/2016   Lab Results  Component Value Date   HDL 37.40 (L) 11/10/2016   No results found for: [facility] Lab Results  Component Value Date   TRIG 313.0 (H) 11/10/2016   Lab Results  Component Value Date   CHOLHDL 8 11/10/2016   Lab Results  Component Value Date   HGBA1C 5.8 11/16/2021      Assessment & Plan:   #1 osteoporosis.  Several years since last DEXA scan which she has had multiple fractures since then including recent stress fracture great toe proximal phalanx.  She is very much interested in getting back on osteoporosis therapy especially given her multiple recent fractures and high risk of falls.  Previous intolerance with Fosamax with significant myalgias.  -We will see about getting approval for Prolia especially in view of her previous intolerance with bisphosphonates.  She is very high risk for falls and subsequent fracture complications. -Recent vitamin D level was excellent.  Continue at least 1200  mg calcium per day and vitamin D 1000 international units daily -New prescription for walker to use at home to reduce fall risk  #2 osteoarthritis.  Patient on tramadol and controlling pain reasonably well.   No orders of the defined types were placed in this encounter.   Follow-up: No follow-ups on file.    Evelena Peat, MD

## 2021-12-08 IMAGING — CT CT HEAD W/O CM
3 series · 16 of 47 positions shown, 19 images · non-contrast
Comparison: None.

CLINICAL DATA: Status post trauma.

EXAM:
CT HEAD WITHOUT CONTRAST
TECHNIQUE: Contiguous axial images were obtained from the base of the skull
through the vertex without intravenous contrast.

[Series 2: head wo · axial · 0.47mm/px · z∈[-146,-11]mm · 10 of 33 slices shown, 13 images]
[im 3/33  brain]
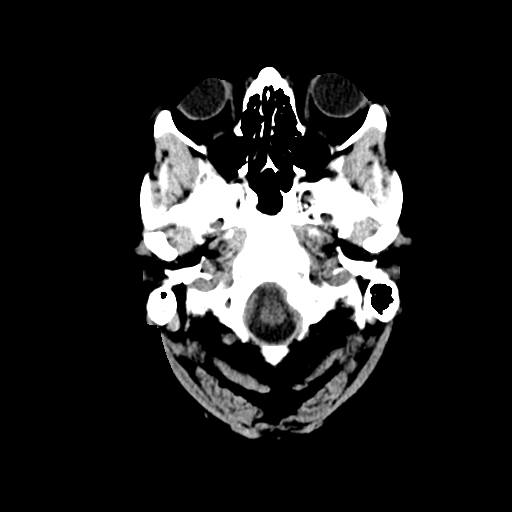
[im 3/33  bone]
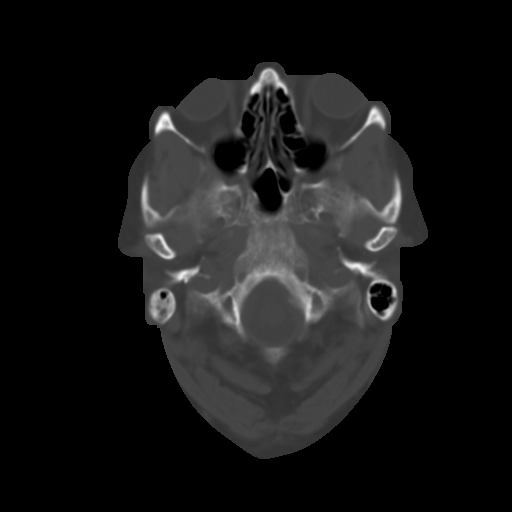
[im 6/33  brain]
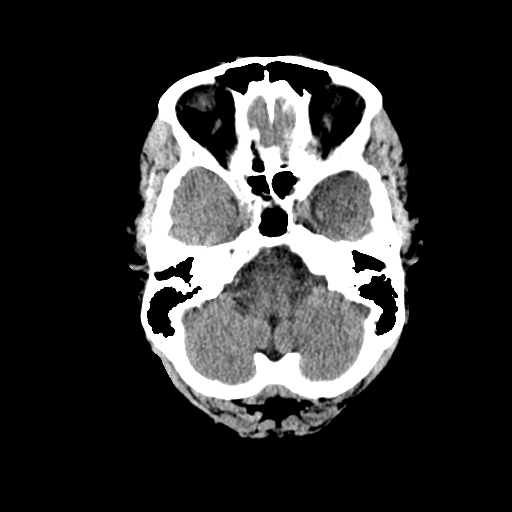
[im 9/33  brain]
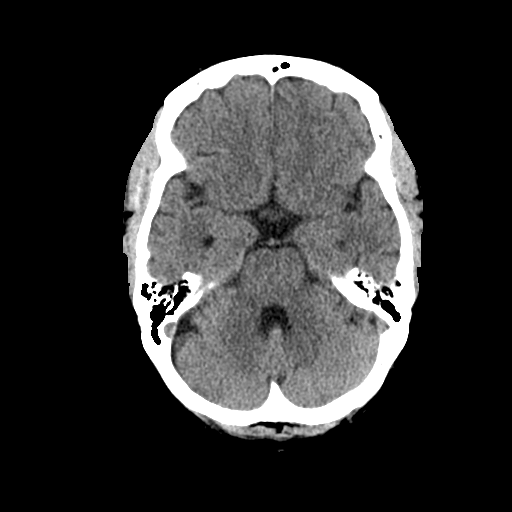
[im 12/33  brain]
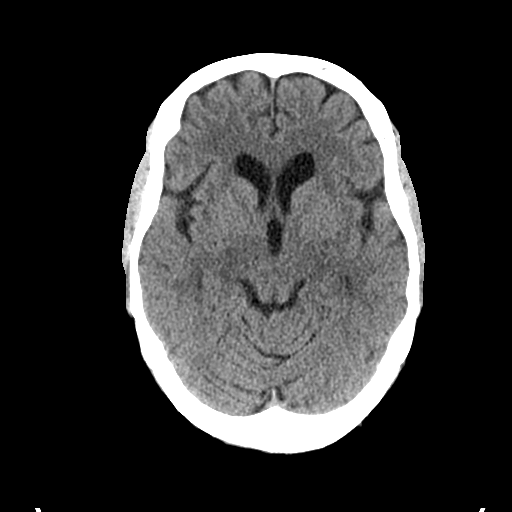
[im 15/33  brain]
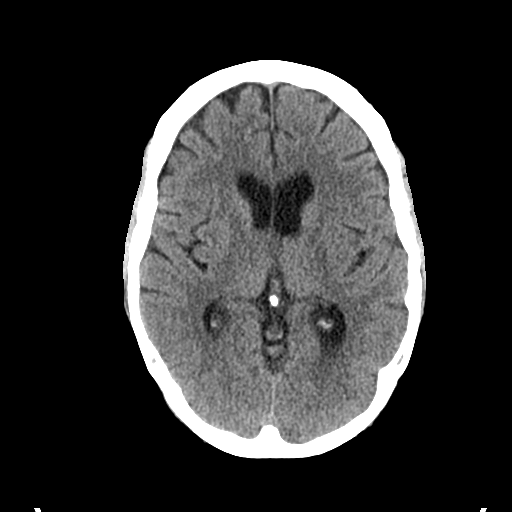
[im 15/33  bone]
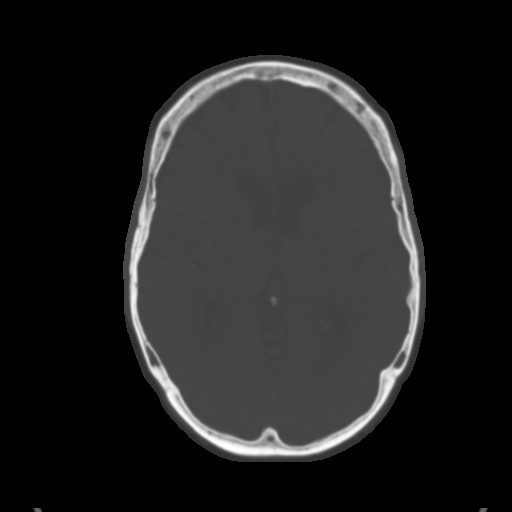
[im 18/33  brain]
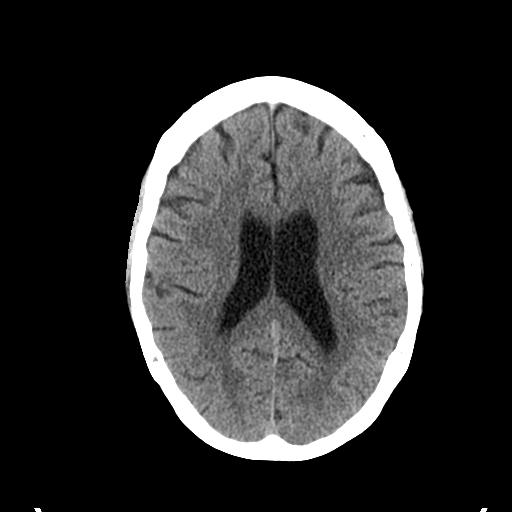
[im 21/33  brain]
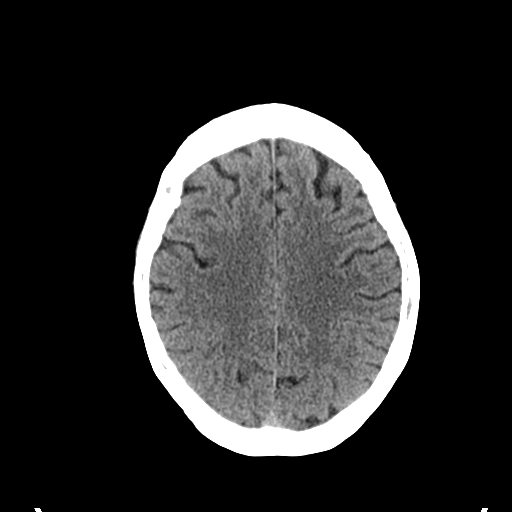
[im 25/33  brain]
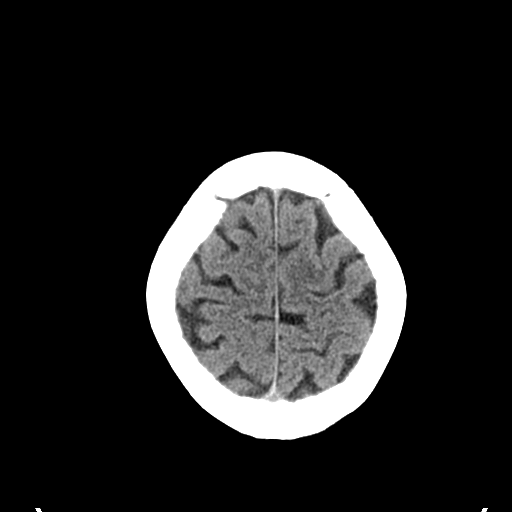
[im 27/33  brain]
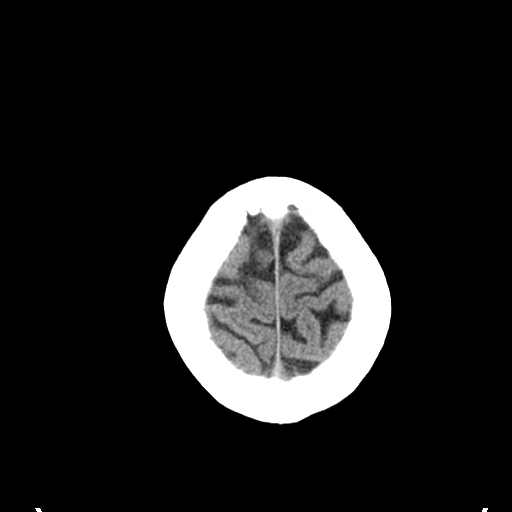
[im 27/33  bone]
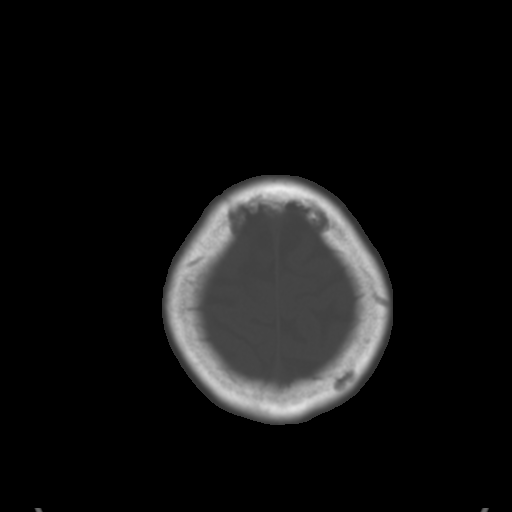
[im 30/33  brain]
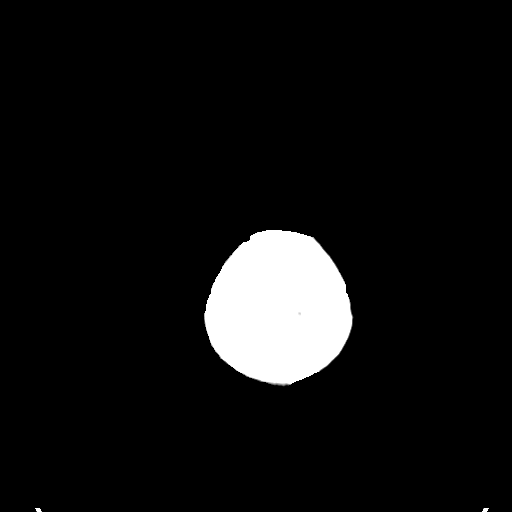

[Series 5: coronal soft tissue · coronal · 0.34mm/px · 3 of 77 slices shown]
[im 26/77  brain]
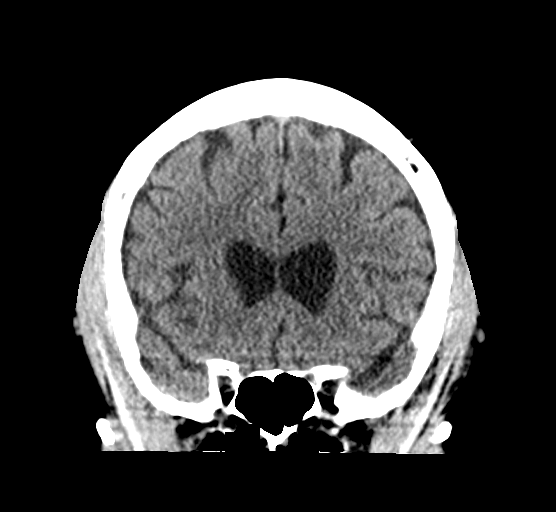
[im 34/77  brain]
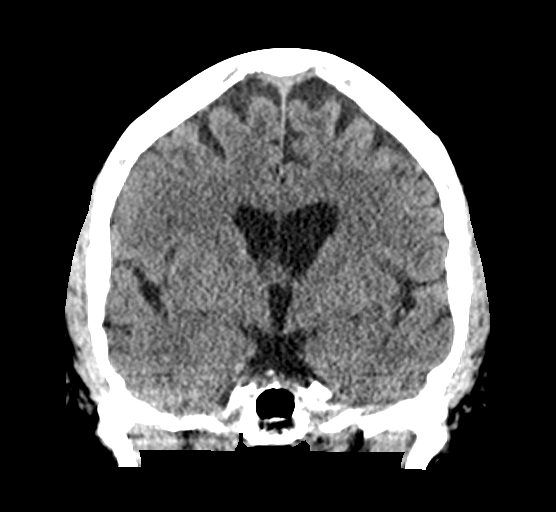
[im 43/77  brain]
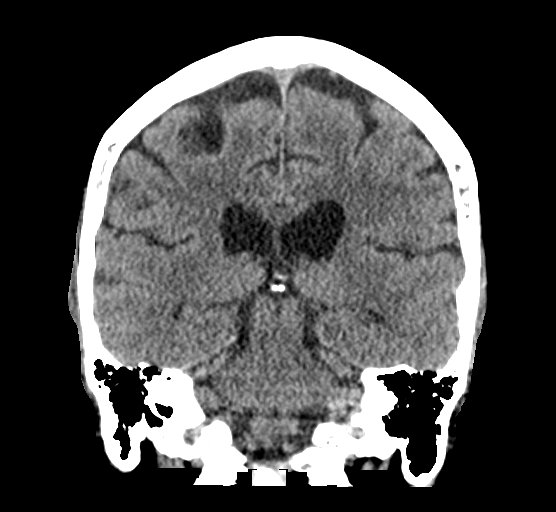

[Series 6: sagittal soft tissue · sagittal · 0.35mm/px · 3 of 62 slices shown]
[im 21/62  brain]
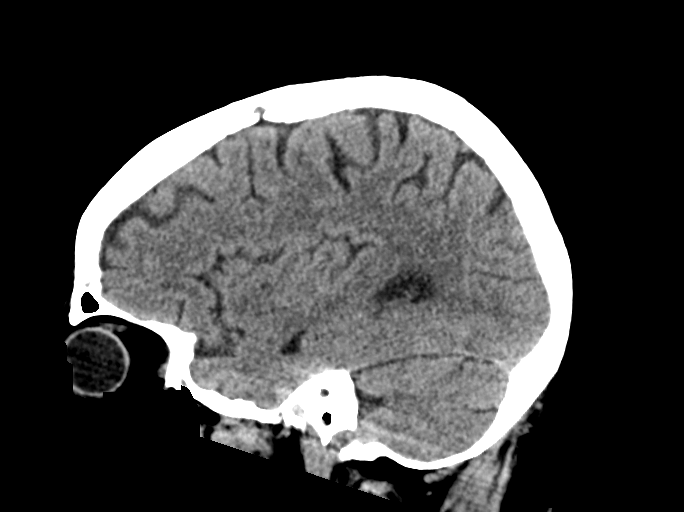
[im 31/62  brain]
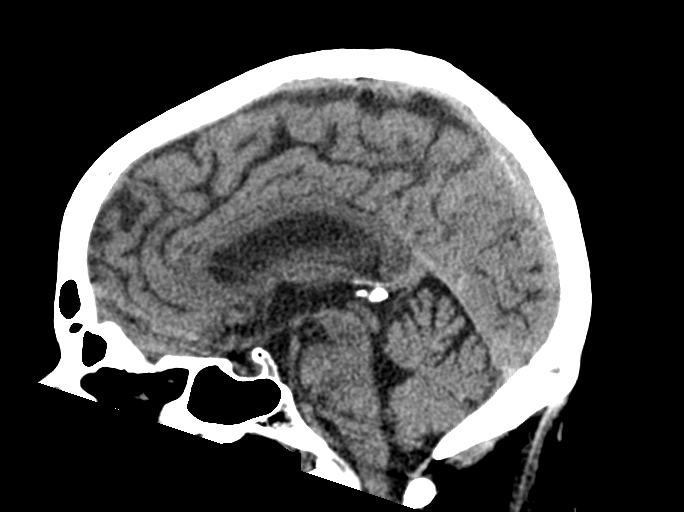
[im 41/62  brain]
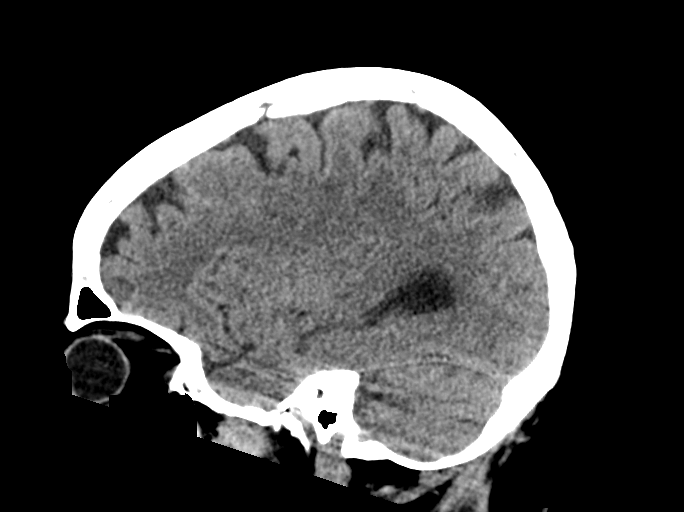

[16 of 47 positions shown; findings below may reference images not displayed]

FINDINGS: Brain: There is mild cerebral atrophy with widening of the
extra-axial spaces and ventricular dilatation.
There are areas of decreased attenuation within the white matter
tracts of the supratentorial brain, consistent with microvascular
disease changes.

Vascular: No hyperdense vessel or unexpected calcification.

Skull: Normal. Negative for fracture or focal lesion.

Sinuses/Orbits: No acute finding.

Other: None.
IMPRESSION: 1. Generalized cerebral atrophy.
2. No acute intracranial abnormality.

## 2022-01-11 ENCOUNTER — Other Ambulatory Visit: Payer: Self-pay | Admitting: Family Medicine

## 2022-01-13 ENCOUNTER — Ambulatory Visit (INDEPENDENT_AMBULATORY_CARE_PROVIDER_SITE_OTHER): Payer: Medicare Other

## 2022-01-13 DIAGNOSIS — M810 Age-related osteoporosis without current pathological fracture: Secondary | ICD-10-CM | POA: Diagnosis not present

## 2022-01-13 MED ORDER — DENOSUMAB 60 MG/ML ~~LOC~~ SOSY
60.0000 mg | PREFILLED_SYRINGE | Freq: Once | SUBCUTANEOUS | Status: AC
  Administered 2022-01-13: 60 mg via SUBCUTANEOUS

## 2022-01-13 NOTE — Progress Notes (Signed)
Per orders of Dr. Clent Ridges, injection of Deosumab 60 mg/ml given by Sherleen Pangborn L Macon Sandiford. ?Patient tolerated injection well.  ?

## 2022-01-19 ENCOUNTER — Telehealth: Payer: Self-pay | Admitting: Pharmacist

## 2022-01-19 NOTE — Chronic Care Management (AMB) (Signed)
? ? ?Chronic Care Management ?Pharmacy Assistant  ? ?Name: Alexandra Norman  MRN: [id number] DOB: [birth year] ? ?Reason for Encounter: Disease State / Hyperlipidemia Assessment Call ?  ?Conditions to be addressed/monitored: ?HLD ? ?Recent office visits:  ?12/07/2021 Alexandra Peat MD - Patient was seen for Charcot-Marie disease and additional issues. No medication changes. No follow up noted.  ? ?11/16/2021 Alexandra Peat MD - Patient was seen for prediabetes and additional issues. No medication changes. No follow up noted.  ? ?Recent consult visits:  ?11/11/2021 Alexandra Norman (orthopedic) - Patient was seen for left foot pain. No medication changes. No follow up visits.  ? ?01/15/2022 Alexandra Norman (orthopedic) - Patient was seen for left foot pain. No medication changes. No follow up visits. ? ?11/06/2021 Alexandra Norman (ortho) - Patient was seen for right shoulder pain. No other chart notes. ? ?Hospital visits:  ?None ? ?Medications: ?Outpatient Encounter Medications as of 01/19/2022  ?Medication Sig  ? Ascorbic Acid (VITAMIN C) 1000 MG tablet Take 1,000 mg by mouth daily.  ? baclofen (LIORESAL) 10 MG tablet Take 1 tablet (10 mg total) by mouth 3 (three) times daily.  ? buPROPion (WELLBUTRIN XL) 150 MG 24 hr tablet Take 450 mg by mouth daily.  ? calcium carbonate (OSCAL) 1500 (600 Ca) MG TABS tablet Take 1,500 mg by mouth daily with breakfast.  ? Coenzyme Q10 (CO Q10) 100 MG CAPS Take 100 mg by mouth daily.  ? Cranberry 400 MG CAPS Take 400 mg by mouth as needed.  ? esomeprazole (NEXIUM) 20 MG capsule Take 20 mg by mouth daily at 12 noon.  ? fluticasone (FLONASE) 50 MCG/ACT nasal spray SPRAY 2 SPRAYS INTO EACH NOSTRIL EVERY DAY (Patient taking differently: Place 2 sprays into both nostrils daily.)  ? gabapentin (NEURONTIN) 800 MG tablet TAKE 1 TABLET BY MOUTH 4 TIMES DAILY.  ? lamoTRIgine (LAMICTAL) 150 MG tablet Take 150 mg by mouth daily.   ? loratadine (CLARITIN) 10 MG tablet Take 10 mg by mouth  daily.  ? Multiple Vitamins-Minerals (MULTIVITAMIN & MINERAL PO) Take 1 tablet by mouth daily.  ? naproxen sodium (ALEVE) 220 MG tablet Take 220 mg by mouth daily as needed (pain).  ? Nutritional Supplements (ADULT GROWTH HORMONE SUPPORT PO) Take 1 capsule by mouth daily.  ? OXcarbazepine (TRILEPTAL) 150 MG tablet Take 150 mg by mouth daily.  ? Probiotic Product (PROBIOTIC COLON SUPPORT) CAPS Take 1 capsule by mouth daily.  ? propranolol (INDERAL) 10 MG tablet Take 10 mg by mouth as needed.  ? QUEtiapine (SEROQUEL) 200 MG tablet Take 200 mg by mouth at bedtime.  ? traMADol (ULTRAM) 50 MG tablet TAKE TWO TABLETS BY MOUTH EVERY 8 HOURS FOR CHRONIC PAIN.  ? VITAMIN D, CHOLECALCIFEROL, PO Take 5,000 Units by mouth daily.  ? ?No facility-administered encounter medications on file as of 01/19/2022.  ?Fill History: ?GABAPENTIN 800 MG TABLET 01/12/2022 90  ? ?OXCARBAZEPINE 150 MG TABLET 09/04/2021 90  ? ?PROPRANOLOL 10 MG TABLET 11/30/2021 30  ? ?QUETIAPINE FUMARATE 200 MG TAB 10/24/2021 90  ? ?BUPROPION HCL XL 150 MG TABLET 11/05/2021 90  ? ?LAMOTRIGINE 150 MG TABLET 06/30/2021 90  ? ?TRAMADOL HCL 50 MG TABLET 01/11/2022 30  ? ?01/19/2022 ?Name: Alexandra Norman MRN: [id number] DOB: 05-Sep-1956 ?Alexandra Norman is a 66 y.o. year old female who is a primary care patient of Burchette, Elberta Fortis, MD.  ?Comprehensive medication review performed; Spoke to patient regarding cholesterol ? ?Lipid Panel ?   ?Component Value Date/Time  ? CHOL 305 (H)  11/10/2016 1359  ? TRIG 313.0 (H) 11/10/2016 1359  ? HDL 37.40 (L) 11/10/2016 1359  ? LDLDIRECT 182.0 11/10/2016 1359  ?  ?10-year ASCVD risk score: The ASCVD Risk score (Arnett DK, et al., 2019) failed to calculate for the following reasons: ?  Cannot find a previous HDL lab ?  Cannot find a previous total cholesterol lab ? ?Current antihyperlipidemic regimen:  ?No lipid medications ?Intolerant to all statins ? ?Previous antihyperlipidemic medications tried: Patient has tried many statin,  unsure of which ones.  ? ?ASCVD risk enhancing conditions: age >82 ? ?What recent interventions/DTPs have been made by any provider to improve Cholesterol control since last CPP Visit: No recent interventions ? ?Any recent hospitalizations or ED visits since last visit with CPP? No recent hospital visits.  ? ?What diet changes have been made to improve Cholesterol?  ?Patient follows no specific diet ?Breakfast - patient will have oatmeal cake ?Lunch - patient occasionally has lunch when she does it will be a sandwich or something light.  ?Dinner - patient will have a lean cuisine meal  ? ?What exercise is being done to improve Cholesterol?  ?Patient is doing her physical therapy leg exercises. ? ?Adherence Review: ?Does the patient have >5 day gap between last estimated fill dates? No ? ?Care Gaps: ?AWV - message sent to Carrie Mew ?Last BP - 138/70 on 12/07/2021 ?Pneumovax - never done ?Shingrix - never done ?Mammogram - overdue ?TDAP - overdue ?  ?Star Rating Drug: ?None ? ?Inetta Fermo CMA  ?Clinical Pharmacist Assistant ?[phone] ? ?

## 2022-03-25 ENCOUNTER — Other Ambulatory Visit: Payer: Self-pay | Admitting: Family Medicine

## 2022-04-05 ENCOUNTER — Telehealth: Payer: Self-pay | Admitting: Pharmacist

## 2022-04-05 NOTE — Chronic Care Management (AMB) (Signed)
    Chronic Care Management Pharmacy Assistant   Name: Alexandra Norman  MRN: [id number] DOB: June 03, 1956  04/15/22 APPOINTMENT REMINDER  Patient was reminded to have all medications, supplements and any blood glucose and blood pressure readings available for review with Gaylord Shih, Pharm. D, for telephone visit on 04/07/22 at 12.    Care Gaps: Zoster Vaccine - Overdue Mammogram - Overdue PNA Vaccine - Overdue TDAP - Overdue AWV- 8/22  Star Rating Drug: None      Medications: Outpatient Encounter Medications as of 04/05/2022  Medication Sig   Ascorbic Acid (VITAMIN C) 1000 MG tablet Take 1,000 mg by mouth daily.   baclofen (LIORESAL) 10 MG tablet Take 1 tablet (10 mg total) by mouth 3 (three) times daily.   buPROPion (WELLBUTRIN XL) 150 MG 24 hr tablet Take 450 mg by mouth daily.   calcium carbonate (OSCAL) 1500 (600 Ca) MG TABS tablet Take 1,500 mg by mouth daily with breakfast.   Coenzyme Q10 (CO Q10) 100 MG CAPS Take 100 mg by mouth daily.   Cranberry 400 MG CAPS Take 400 mg by mouth as needed.   esomeprazole (NEXIUM) 20 MG capsule Take 20 mg by mouth daily at 12 noon.   fluticasone (FLONASE) 50 MCG/ACT nasal spray SPRAY 2 SPRAYS INTO EACH NOSTRIL EVERY DAY (Patient taking differently: Place 2 sprays into both nostrils daily.)   gabapentin (NEURONTIN) 800 MG tablet TAKE 1 TABLET BY MOUTH FOUR TIMES A DAY   lamoTRIgine (LAMICTAL) 150 MG tablet Take 150 mg by mouth daily.    loratadine (CLARITIN) 10 MG tablet Take 10 mg by mouth daily.   Multiple Vitamins-Minerals (MULTIVITAMIN & MINERAL PO) Take 1 tablet by mouth daily.   naproxen sodium (ALEVE) 220 MG tablet Take 220 mg by mouth daily as needed (pain).   Nutritional Supplements (ADULT GROWTH HORMONE SUPPORT PO) Take 1 capsule by mouth daily.   OXcarbazepine (TRILEPTAL) 150 MG tablet Take 150 mg by mouth daily.   Probiotic Product (PROBIOTIC COLON SUPPORT) CAPS Take 1 capsule by mouth daily.   propranolol (INDERAL) 10 MG  tablet Take 10 mg by mouth as needed.   QUEtiapine (SEROQUEL) 200 MG tablet Take 200 mg by mouth at bedtime.   traMADol (ULTRAM) 50 MG tablet TAKE TWO TABLETS BY MOUTH EVERY 8 HOURS FOR CHRONIC PAIN.   VITAMIN D, CHOLECALCIFEROL, PO Take 5,000 Units by mouth daily.   No facility-administered encounter medications on file as of 04/05/2022.     Pamala Duffel CMA Clinical Pharmacist Assistant [phone]

## 2022-04-07 ENCOUNTER — Telehealth: Payer: Self-pay | Admitting: Pharmacist

## 2022-04-07 ENCOUNTER — Other Ambulatory Visit: Payer: Self-pay | Admitting: Family Medicine

## 2022-04-07 ENCOUNTER — Ambulatory Visit (INDEPENDENT_AMBULATORY_CARE_PROVIDER_SITE_OTHER): Payer: Medicare Other | Admitting: Pharmacist

## 2022-04-07 DIAGNOSIS — E785 Hyperlipidemia, unspecified: Secondary | ICD-10-CM

## 2022-04-07 DIAGNOSIS — G600 Hereditary motor and sensory neuropathy: Secondary | ICD-10-CM

## 2022-04-07 DIAGNOSIS — M810 Age-related osteoporosis without current pathological fracture: Secondary | ICD-10-CM

## 2022-04-07 MED ORDER — PANTOPRAZOLE SODIUM 40 MG PO TBEC: 40.0000 mg | DELAYED_RELEASE_TABLET | Freq: Every day | ORAL | 3 refills | Status: DC

## 2022-04-07 NOTE — Telephone Encounter (Signed)
Called patient to make her aware of the change from Nexium to pantoprazole. Patient is aware to not take them both. Recommended trial of pantoprazole at least half an hour prior to dinner as the evening time is when she has the most symptoms. Patient will follow up with PCP in 1 month on efficacy. Patient verbalized her understanding.

## 2022-04-07 NOTE — Addendum Note (Signed)
Addended by: Kristian Covey on: 04/07/2022 01:00 PM   Modules accepted: Orders

## 2022-04-07 NOTE — Progress Notes (Signed)
Chronic Care Management Pharmacy Note  04/07/2022 Name:  Alexandra Norman MRN:  [id number] DOB:  [birth year]  Summary: LDL not at goal < 100 Pt is still having heartburn symptoms with Nexium  Recommendations/Changes made from today's visit: -Recommended taking Ingrezza prior to bedtime to see if this provides additional relief of RLS symptoms -Recommended switching Nexium to Pantoprazole due to not enough relief from Nexium -Recommended taking PPI 1/2 hour to 1 hour prior to dinner time with frequent nighttime symptoms  Plan: Follow up PPI tolerance assessment in 2-3 weeks Follow up as needed with patient moving  Subjective: Alexandra Norman is an 66 y.o. year old female who is a primary patient of Burchette, Alinda Sierras, MD.  The CCM team was consulted for assistance with disease management and care coordination needs.    Engaged with patient by telephone for follow up visit in response to provider referral for pharmacy case management and/or care coordination services.   Consent to Services:  The patient was given information about Chronic Care Management services, agreed to services, and gave verbal consent prior to initiation of services.  Please see initial visit note for detailed documentation.   Patient Care Team: Eulas Post, MD as PCP - General (Family Medicine) Viona Gilmore, [facility] as Pharmacist (Pharmacist)  Recent office visits: 01/13/22 Mykal Good, CMA: Patient presented for Prolia injection.  12/07/2021 Carolann Littler MD - Patient was seen for Charcot-Marie disease and additional issues. No medication changes. No follow up noted.    11/16/2021 Carolann Littler MD - Patient was seen for prediabetes and additional issues. No medication changes. No follow up noted.   10/07/2021 Carolann Littler MD - Patient was seen for hearing loss due to cerumen impaction, right. No medication changes. No follow up noted.  Recent consult visits: 12/02/2021 Donette Larry  (orthopedic) - Patient was seen for left foot pain. No medication changes. No follow up visits.    11/11/2021 Donette Larry (orthopedic) - Patient was seen for left foot pain. No medication changes. No follow up visits.   11/06/2021 Malena Peer (ortho) - Patient was seen for right shoulder pain. No other chart notes.  Hospital visits: None in previous 6 months  Objective:  Lab Results  Component Value Date   CREATININE 0.75 05/15/2021   BUN 18 05/15/2021   GFR 81.00 02/18/2021   GFRNONAA >60 05/15/2021   GFRAA >60 05/08/2015   NA 138 05/15/2021   K 4.2 05/15/2021   CALCIUM 9.1 05/15/2021   CO2 30 05/15/2021   GLUCOSE 88 05/15/2021    Lab Results  Component Value Date/Time   HGBA1C 5.8 11/16/2021 12:01 PM   HGBA1C 6.1 02/18/2021 10:48 AM   GFR 81.00 02/18/2021 10:48 AM   GFR 114.58 11/10/2016 01:59 PM    Last diabetic Eye exam:  Lab Results  Component Value Date/Time   HMDIABEYEEXA No Retinopathy 05/24/2019 12:00 AM    Last diabetic Foot exam: No results found for: "HMDIABFOOTEX"   Lab Results  Component Value Date   CHOL 305 (H) 11/10/2016   HDL 37.40 (L) 11/10/2016   LDLDIRECT 182.0 11/10/2016   TRIG 313.0 (H) 11/10/2016   CHOLHDL 8 11/10/2016       Latest Ref Rng & Units 02/18/2021   10:48 AM 11/10/2016    1:59 PM 10/01/2013    4:02 PM  Hepatic Function  Total Protein 6.0 - 8.3 g/dL 6.4  6.3  6.0   Albumin 3.5 - 5.2 g/dL 4.2  4.0  3.9   AST  0 - 37 U/L 10  24  21    ALT 0 - 35 U/L 7  25  15    Alk Phosphatase 39 - 117 U/L 99  88  71   Total Bilirubin 0.2 - 1.2 mg/dL 0.3  0.4  0.8   Bilirubin, Direct 0.0 - 0.3 mg/dL  0.1  0.0     Lab Results  Component Value Date/Time   TSH 6.54 (H) 11/16/2021 12:01 PM   TSH 5.05 (H) 11/17/2016 03:51 PM   FREET4 0.68 11/16/2021 12:01 PM   FREET4 0.68 11/17/2016 03:51 PM       Latest Ref Rng & Units 07/10/2021    3:07 PM 05/15/2021   12:53 AM 05/14/2021    1:26 AM  CBC  WBC 4.0 - 10.5 K/uL 7.2  8.7  8.0    Hemoglobin 12.0 - 15.0 g/dL 13.2  11.5  11.4   Hematocrit 36.0 - 46.0 % 41.0  37.5  36.2   Platelets 150.0 - 400.0 K/uL 361.0  311  270     Lab Results  Component Value Date/Time   VD25OH 57.32 11/16/2021 12:01 PM    Clinical ASCVD: No  The ASCVD Risk score (Arnett DK, et al., 2019) failed to calculate for the following reasons:   Cannot find a previous HDL lab   Cannot find a previous total cholesterol lab       12/07/2021    2:33 PM 05/22/2021   11:35 AM 05/22/2021   11:31 AM  Depression screen PHQ 2/9  Decreased Interest 0 0 0  Down, Depressed, Hopeless 0 0 0  PHQ - 2 Score 0 0 0  Altered sleeping 1    Tired, decreased energy 1    Change in appetite 0    Feeling bad or failure about yourself  0    Trouble concentrating 0    Moving slowly or fidgety/restless 0    Suicidal thoughts 0    PHQ-9 Score 2       Social History   Tobacco Use  Smoking Status Never  Smokeless Tobacco Never   BP Readings from Last 3 Encounters:  12/07/21 138/70  11/16/21 128/80  10/07/21 124/68   Pulse Readings from Last 3 Encounters:  12/07/21 77  11/16/21 80  10/07/21 73   Wt Readings from Last 3 Encounters:  12/07/21 165 lb 8 oz (75.1 kg)  11/16/21 162 lb 6.4 oz (73.7 kg)  10/07/21 164 lb 14.4 oz (74.8 kg)   BMI Readings from Last 3 Encounters:  12/07/21 29.32 kg/m  11/16/21 28.77 kg/m  10/07/21 29.21 kg/m    Assessment/Interventions: Review of patient past medical history, allergies, medications, health status, including review of consultants reports, laboratory and other test data, was performed as part of comprehensive evaluation and provision of chronic care management services.   SDOH:  (Social Determinants of Health) assessments and interventions performed: Yes   SDOH Screenings   Alcohol Screen: Low Risk  (05/22/2021)   Alcohol Screen    Last Alcohol Screening Score (AUDIT): 0  Depression (PHQ2-9): Low Risk  (12/07/2021)   Depression (PHQ2-9)    PHQ-2 Score: 2   Financial Resource Strain: Low Risk  (11/03/2021)   Overall Financial Resource Strain (CARDIA)    Difficulty of Paying Living Expenses: Not very hard  Food Insecurity: No Food Insecurity (05/22/2021)   Hunger Vital Sign    Worried About Running Out of Food in the Last Year: Never true    Ran Out of Food in the Last  Year: Never true  Housing: Low Risk  (05/22/2021)   Housing    Last Housing Risk Score: 0  Physical Activity: Inactive (05/22/2021)   Exercise Vital Sign    Days of Exercise per Week: 0 days    Minutes of Exercise per Session: 0 min  Social Connections: Socially Isolated (05/22/2021)   Social Connection and Isolation Panel [NHANES]    Frequency of Communication with Friends and Family: Three times a week    Frequency of Social Gatherings with Friends and Family: Three times a week    Attends Religious Services: Never    Active Member of Clubs or Organizations: No    Attends Archivist Meetings: Never    Marital Status: Divorced  Stress: No Stress Concern Present (05/22/2021)   [place]    Feeling of Stress : Not at all  Tobacco Use: Low Risk  (12/07/2021)   Patient History    Smoking Tobacco Use: Never    Smokeless Tobacco Use: Never    Passive Exposure: Not on file  Transportation Needs: No Transportation Needs (11/03/2021)   PRAPARE - Transportation    Lack of Transportation (Medical): No    Lack of Transportation (Non-Medical): No   CCM Care Plan  Allergies  Allergen Reactions   Penicillins     hives   Statins    Sulfa Antibiotics     Medications Reviewed Today     Reviewed by Viona Gilmore, [facility] (Pharmacist) on 04/07/22 at 29  Med List Status: <None>   Medication Order Taking? Sig Documenting Provider Last Dose Status Informant  Ascorbic Acid (VITAMIN C) 1000 MG tablet [id number]  Take 1,000 mg by mouth daily. [provider]  Active Self           Med Note [place] Crane,  MISTY D   Tue May 12, 2021  1:22 AM)    baclofen (LIORESAL) 10 MG tablet [id number]  Take 1 tablet (10 mg total) by mouth 3 (three) times daily. Eulas Post, MD  Active   buPROPion (WELLBUTRIN XL) 150 MG 24 hr tablet [id number]  Take 450 mg by mouth daily. [provider]  Active Self  calcium carbonate (OSCAL) 1500 (600 Ca) MG TABS tablet [id number]  Take 1,500 mg by mouth daily with breakfast. [provider]  Active Self  Coenzyme Q10 (CO Q10) 100 MG CAPS [id number]  Take 100 mg by mouth daily. [provider]  Active Self  Cranberry 400 MG CAPS [id number]  Take 400 mg by mouth as needed. [provider]  Active Self  esomeprazole (NEXIUM) 20 MG capsule [id number]  Take 20 mg by mouth daily at 12 noon. [provider]  Active   fluticasone (FLONASE) 50 MCG/ACT nasal spray [id number]  SPRAY 2 SPRAYS INTO EACH NOSTRIL EVERY DAY  Patient taking differently: Place 2 sprays into both nostrils daily.   Eulas Post, MD  Active   gabapentin (NEURONTIN) 800 MG tablet [id number]  TAKE 1 TABLET BY MOUTH FOUR TIMES A DAY Burchette, Alinda Sierras, MD  Active   lamoTRIgine (LAMICTAL) 150 MG tablet [id number]  Take 150 mg by mouth daily.  [provider]  Active Self  loratadine (CLARITIN) 10 MG tablet [id number]  Take 10 mg by mouth daily. [provider]  Active Self  Multiple Vitamins-Minerals (MULTIVITAMIN & MINERAL PO) [id number]  Take 1 tablet by mouth daily. [provider]  Active Self  Nutritional Supplements (ADULT GROWTH HORMONE SUPPORT PO)  [id number]  Take 1 capsule by mouth daily. [provider]  Active   Probiotic Product (PROBIOTIC COLON SUPPORT) CAPS [id number]  Take 1 capsule by mouth daily. [provider]  Active Self  propranolol (INDERAL) 10 MG tablet [id number]  Take 10 mg by mouth as needed. [provider]  Active   QUEtiapine (SEROQUEL) 200 MG tablet [id number] Yes Take 200 mg by mouth at  bedtime. [provider] Taking Active Self           Med Note Kipp Brood, Dartagnan Beavers Forrest Moron Oct 26, 2021  3:52 PM)    traMADol (ULTRAM) 50 MG tablet [id number]  TAKE TWO TABLETS BY MOUTH EVERY 8 HOURS FOR CHRONIC PAIN. Eulas Post, MD  Active   valbenazine [facility]) 40 MG capsule [id number] Yes Take 40 mg by mouth daily. [provider] Taking Active   VITAMIN D, CHOLECALCIFEROL, PO [id number]  Take 5,000 Units by mouth daily. [provider]  Active Self            Patient Active Problem List   Diagnosis Date Noted   Fall 05/13/2021   Rib fractures 05/12/2021   Leukocytosis 05/12/2021   Mood disorder ([place]) 05/12/2021   Hyperglycemia 07/09/2019   S/P ORIF (open reduction internal fixation) fracture 08/12/2016   Pain, wrist, left 08/09/2016   Constipation 04/29/2015   Encounter to establish care 04/29/2015   Peripheral neuropathy 09/07/2012   Charcot-Marie-Tooth disease 01/31/2012   Hypothyroid 01/31/2012   Atrial septal defect 01/31/2012   Hyperlipidemia 01/31/2012   GERD (gastroesophageal reflux disease) 01/31/2012   Osteoarthritis 01/31/2012   Osteoporosis 01/31/2012   Depression 01/31/2012   ADD (attention deficit disorder) 01/31/2012   Fibromyalgia 01/31/2012   Allergic rhinitis 01/31/2012    Immunization History  Administered Date(s) Administered   Fluad Quad(high Dose 65+) 07/10/2021   Influenza Split 07/12/2012   Influenza,inj,Quad PF,6+ Mos 07/10/2013, 08/06/2014, 11/24/2015, 09/14/2016, 06/14/2017, 09/05/2018, 07/09/2019   Influenza-Unspecified 06/22/2017   Moderna Sars-Covid-2 Vaccination 10/07/2020   PFIZER Comirnaty(Gray Top)Covid-19 Tri-Sucrose Vaccine 02/18/2021   PFIZER(Purple Top)SARS-COV-2 Vaccination 01/10/2020, 02/04/2020   Tdap 10/05/2011   Unspecified SARS-COV-2 Vaccination 01/10/2020, 02/04/2020   Patient currently has fracture in her right foot and her psychiatrist stopped her Trileptal because he was worried about  dizziness. She isn't sure it has helped much with getting rid of the dizziness. He also started her on Ingrezza to help with the RLS symptoms and she is taking it in the morning. Patient tries to avoid caffeine at all costs but does admit to taking Excedrin some. She doesn't drink anything but water and tries to avoid sugar 2-3 hours before bedtime as well.  Patient is not taking Aleve anymore because of her hiatal hernia. Patient does still get some acid reflux at night.   Patient is moving in February of next year and will be closer to her sister in [place] and other friends and family. She is moving as her neighbor that has been helping her out is moving at the start of the year.   Patient did restart the Prolia in April and did not notice any side effects with this medication. She did also get the medication completely covered by insurance.  Conditions to be addressed/monitored:  Hyperlipidemia, GERD, Hypothyroidism, Depression, Osteoporosis, Osteoarthritis, and Allergic Rhinitis  Conditions addressed this visit: Osteoporosis, GERD  Care Plan : [place]  Updates made by Viona Gilmore, [place] since 04/07/2022 12:00 AM     Problem: Problem: Hyperlipidemia, GERD, Hypothyroidism,  Depression, Osteoporosis, Osteoarthritis, and Allergic Rhinitis      Long-Range Goal: Patient-Specific Goal   Start Date: 10/26/2021  Expected End Date: 10/26/2022  Recent Progress: On track  Priority: High  Note:   Current Barriers:  Unable to independently monitor therapeutic efficacy Unable to achieve control of cholesterol   Pharmacist Clinical Goal(s):  Patient will achieve adherence to monitoring guidelines and medication adherence to achieve therapeutic efficacy achieve control of cholesterol as evidenced by next lipid panel  through collaboration with PharmD and provider.   Interventions: 1:1 collaboration with Eulas Post, MD regarding development and update of comprehensive  plan of care as evidenced by provider attestation and co-signature Inter-disciplinary care team collaboration (see longitudinal plan of care) Comprehensive medication review performed; medication list updated in electronic medical record  Hyperlipidemia: (LDL goal < 100) -Uncontrolled -Current treatment: No medications -Medications previously tried: statins (pt does not want to take due to risk for muscle pain)  -Current dietary patterns: does not always limit these foods -Current exercise habits: minimal  -Educated on Cholesterol goals;  Benefits of statin for ASCVD risk reduction; Importance of limiting foods high in cholesterol; -Counseled on diet and exercise extensively Recommended repeat lipid panel and consider PCSK9 inhibitor based on concerns for muscle pain.  Depression/Anxiety/Panic attacks (Goal: minimize symptoms) -Controlled -Current treatment: Lamotrigine 150 mg 1 tablet daily - Appropriate, Effective, Safe, Accessible Quetiapine 200 mg mg 1 tablet at bedtime - Appropriate, Effective, Safe, Accessible Bupropion XL 150 mg 3 tablets daily - Appropriate, Effective, Safe, Accessible Propranolol 10 mg 1 tablet twice daily as needed - Appropriate, Effective, Safe, Accessible -Medications previously tried/failed: several medications, oxcarbazepine (dizziness) -PHQ9: 0 -GAD7: n/a -Educated on Benefits of medication for symptom control Benefits of cognitive-behavioral therapy with or without medication -Counseled on diet and exercise extensively Recommended to continue current medication  Osteoporosis (Goal prevent fractures) -Uncontrolled -Last DEXA Scan: 2018   T-Score femoral neck: -2.6  T-Score total hip: n/a  T-Score lumbar spine: -2.8  T-Score forearm radius: n/a  10-year probability of major osteoporotic fracture: n/a  10-year probability of hip fracture: n/a -Patient is a candidate for pharmacologic treatment due to T-Score < -2.5 in femoral neck and T-Score <  -2.5 in lumbar spine -Current treatment  Caltrate carbonate 600 mg, 2000 units with vitamin D - 1 tablet with breakfast  - Appropriate, Effective, Safe, Accessible Multivitamin (300 mg calcium, 1000 units vitamin D)  Vitamin D 5000 units daily - Appropriate, Effective, Safe, Accessible Prolia inject 60 mg every 6 months (last dose 01/2022) - Appropriate, Effective, Safe, Accessible -Medications previously tried: oral bisphosphonates (severe myalgias)  -Recommend [phone] units of vitamin D daily. Recommend 1200 mg of calcium daily from dietary and supplemental sources. Recommend weight-bearing and muscle strengthening exercises for building and maintaining bone density. -Recommended repeat DEXA in 2 years.  GERD/Hiatal hernia (Goal: minimize symptoms) -Controlled -Current treatment  Esomeprazole 20 mg 1 capsule daily - Appropriate, Query effective, Safe, Accessible -Medications previously tried: unknown  -Counseled on non-pharmacologic management of symptoms such as elevating the head of your bed, avoiding eating 2-3 hours before bed, avoiding triggering foods such as acidic, spicy, or fatty foods, eating smaller meals, and wearing clothes that are loose around the waist. Recommended switching Nexium to Pantoprazole for greater relief of symptoms.  Osteoarthritis/pain (Goal: minimize pain) -Not ideally controlled -Current treatment  Tramadol 50 mg 2 tablets every 8 hours as needed (not always 3 times a day) - Appropriate, Effective, Safe, Accessible Baclofen 10 mg 1 tablet  three times daily - Appropriate, Effective, Query Safe, Accessible Gabapentin 800 mg 1 tablet 4 times daily - Appropriate, Effective, Query Safe, Accessible Lidocaine 5% patches as needed - Appropriate, Effective, Safe, Accessible Biofreeze as needed - Appropriate, Effective, Safe, Accessible -Medications previously tried: oxycodone, Aleve (hernia) -Recommended to continue current medication  Allergic rhinitis (Goal:  minimize symptoms) -Controlled -Current treatment  Loratadine 10 mg 1 tablet daily - Appropriate, Effective, Safe, Accessible Fluticasone 50 mcg/act spray in both nostrils  - Appropriate, Effective, Safe, Accessible -Medications previously tried: none  -Counseled on avoidance of allergens.  Restless legs syndrome  (Goal: minimize symptoms) -Not ideally controlled -Current treatment  Ingrezza 40 mg 1 capsule daily - Appropriate, Query effective, Safe, Accessible -Medications previously tried: none  -Recommended taking it prior to bedtime to see if this provides additional relief of symptoms.   Health Maintenance -Vaccine gaps: shingrix, PCV20, tetanus -Current therapy:  Colace 100 mg 1 capsule daily Miralax as needed Trimacinolone cream 0.1% as needed CoQ10 100 mg 1 capsule daily Cranberry 400 mg 1 capsule daily - uses as needed Vitamin C 500 mg 1 tablet daily (extra C) 1000 mg Magnesium glyconate 120 mg (x 2 capsules) Fish oil - iwi algae daily Nerve pain daily Osteo-bi flex with vitamin D 2000 units daily -Educated on Herbal supplement research is limited and benefits usually cannot be proven Cost vs benefit of each product must be carefully weighed by individual consumer Supplements may interfere with prescription drugs -Patient is satisfied with current therapy and denies issues -Recommended to continue current medication  Patient Goals/Self-Care Activities Patient will:  - take medications as prescribed as evidenced by patient report and record review target a minimum of 150 minutes of moderate intensity exercise weekly engage in dietary modifications by increasing dietary fiber intake  Follow Up Plan: The care management team will reach out to the patient again over the next 30 days.       Medication Assistance: None required.  Patient affirms current coverage meets needs.  Compliance/Adherence/Medication fill history: Care Gaps: Shingrix, PCV20, tetanus,  mammogram  Star-Rating Drugs: None  Patient's [facility] pharmacy [facility]  [id number]: [phone]: [phone] Uses pill box? Yes - using weekly Pt endorses 100% compliance  We discussed: Current pharmacy is preferred with insurance plan and patient is satisfied with pharmacy services Patient decided to: Continue current medication management strategy  Care Plan and Follow Up Patient Decision:  Patient agrees to Care Plan and Follow-up.  Plan: The care management team will reach out to the patient again over the next 30 days.  MJeni Salles PharmD, BAlinePharmacist [place]at [place]

## 2022-04-07 NOTE — Patient Instructions (Signed)
Hi Alexandra Norman,  I am glad things are going well with you! I will let you know what Dr. Caryl Never says about the heartburn medication. In the meantime, I would try to take the Nexium before dinner time to see if that helps some.  Also, go ahead and try the Ingrezza before bedtime to see if this helps alleviate those night time restless legs symptoms.  Please reach out to me if you have any questions or need anything!  Best, Maddie  Gaylord Shih, PharmD, [facility] Clinical Pharmacist  [facility] [phone]   Visit Information   Goals Addressed   None    Patient Care Plan: CCM Pharmacy Care Plan     Problem Identified: Problem: Hyperlipidemia, GERD, Hypothyroidism, Depression, Osteoporosis, Osteoarthritis, and Allergic Rhinitis      Long-Range Goal: Patient-Specific Goal   Start Date: 10/26/2021  Expected End Date: 10/26/2022  Recent Progress: On track  Priority: High  Note:   Current Barriers:  Unable to independently monitor therapeutic efficacy Unable to achieve control of cholesterol   Pharmacist Clinical Goal(s):  Patient will achieve adherence to monitoring guidelines and medication adherence to achieve therapeutic efficacy achieve control of cholesterol as evidenced by next lipid panel  through collaboration with PharmD and provider.   Interventions: 1:1 collaboration with Kristian Covey, MD regarding development and update of comprehensive plan of care as evidenced by provider attestation and co-signature Inter-disciplinary care team collaboration (see longitudinal plan of care) Comprehensive medication review performed; medication list updated in electronic medical record  Hyperlipidemia: (LDL goal < 100) -Uncontrolled -Current treatment: No medications -Medications previously tried: statins (pt does not want to take due to risk for muscle pain)  -Current dietary patterns: does not always limit these foods -Current exercise habits:  minimal  -Educated on Cholesterol goals;  Benefits of statin for ASCVD risk reduction; Importance of limiting foods high in cholesterol; -Counseled on diet and exercise extensively Recommended repeat lipid panel and consider PCSK9 inhibitor based on concerns for muscle pain.  Depression/Anxiety/Panic attacks (Goal: minimize symptoms) -Controlled -Current treatment: Lamotrigine 150 mg 1 tablet daily - Appropriate, Effective, Safe, Accessible Quetiapine 200 mg mg 1 tablet at bedtime - Appropriate, Effective, Safe, Accessible Bupropion XL 150 mg 3 tablets daily - Appropriate, Effective, Safe, Accessible Propranolol 10 mg 1 tablet twice daily as needed - Appropriate, Effective, Safe, Accessible -Medications previously tried/failed: several medications, oxcarbazepine (dizziness) -PHQ9: 0 -GAD7: n/a -Educated on Benefits of medication for symptom control Benefits of cognitive-behavioral therapy with or without medication -Counseled on diet and exercise extensively Recommended to continue current medication  Osteoporosis (Goal prevent fractures) -Uncontrolled -Last DEXA Scan: 2018   T-Score femoral neck: -2.6  T-Score total hip: n/a  T-Score lumbar spine: -2.8  T-Score forearm radius: n/a  10-year probability of major osteoporotic fracture: n/a  10-year probability of hip fracture: n/a -Patient is a candidate for pharmacologic treatment due to T-Score < -2.5 in femoral neck and T-Score < -2.5 in lumbar spine -Current treatment  Caltrate carbonate 600 mg, 2000 units with vitamin D - 1 tablet with breakfast  - Appropriate, Effective, Safe, Accessible Multivitamin (300 mg calcium, 1000 units vitamin D)  Vitamin D 5000 units daily - Appropriate, Effective, Safe, Accessible Prolia inject 60 mg every 6 months (last dose 01/2022) - Appropriate, Effective, Safe, Accessible -Medications previously tried: oral bisphosphonates (severe myalgias)  -Recommend [phone] units of vitamin D daily.  Recommend 1200 mg of calcium daily from dietary and supplemental sources. Recommend weight-bearing and muscle strengthening exercises for building  and maintaining bone density. -Recommended repeat DEXA in 2 years.  GERD/Hiatal hernia (Goal: minimize symptoms) -Controlled -Current treatment  Esomeprazole 20 mg 1 capsule daily - Appropriate, Query effective, Safe, Accessible -Medications previously tried: unknown  -Counseled on non-pharmacologic management of symptoms such as elevating the head of your bed, avoiding eating 2-3 hours before bed, avoiding triggering foods such as acidic, spicy, or fatty foods, eating smaller meals, and wearing clothes that are loose around the waist. Recommended switching Nexium to Pantoprazole for greater relief of symptoms.  Osteoarthritis/pain (Goal: minimize pain) -Not ideally controlled -Current treatment  Tramadol 50 mg 2 tablets every 8 hours as needed (not always 3 times a day) - Appropriate, Effective, Safe, Accessible Baclofen 10 mg 1 tablet three times daily - Appropriate, Effective, Query Safe, Accessible Gabapentin 800 mg 1 tablet 4 times daily - Appropriate, Effective, Query Safe, Accessible Lidocaine 5% patches as needed - Appropriate, Effective, Safe, Accessible Biofreeze as needed - Appropriate, Effective, Safe, Accessible -Medications previously tried: oxycodone, Aleve (hernia) -Recommended to continue current medication  Allergic rhinitis (Goal: minimize symptoms) -Controlled -Current treatment  Loratadine 10 mg 1 tablet daily - Appropriate, Effective, Safe, Accessible Fluticasone 50 mcg/act spray in both nostrils  - Appropriate, Effective, Safe, Accessible -Medications previously tried: none  -Counseled on avoidance of allergens.  Restless legs syndrome  (Goal: minimize symptoms) -Not ideally controlled -Current treatment  Ingrezza 40 mg 1 capsule daily - Appropriate, Query effective, Safe, Accessible -Medications previously tried:  none  -Recommended taking it prior to bedtime to see if this provides additional relief of symptoms.   Health Maintenance -Vaccine gaps: shingrix, PCV20, tetanus -Current therapy:  Colace 100 mg 1 capsule daily Miralax as needed Trimacinolone cream 0.1% as needed CoQ10 100 mg 1 capsule daily Cranberry 400 mg 1 capsule daily - uses as needed Vitamin C 500 mg 1 tablet daily (extra C) 1000 mg Magnesium glyconate 120 mg (x 2 capsules) Fish oil - iwi algae daily Nerve pain daily Osteo-bi flex with vitamin D 2000 units daily -Educated on Herbal supplement research is limited and benefits usually cannot be proven Cost vs benefit of each product must be carefully weighed by individual consumer Supplements may interfere with prescription drugs -Patient is satisfied with current therapy and denies issues -Recommended to continue current medication  Patient Goals/Self-Care Activities Patient will:  - take medications as prescribed as evidenced by patient report and record review target a minimum of 150 minutes of moderate intensity exercise weekly engage in dietary modifications by increasing dietary fiber intake  Follow Up Plan: The care management team will reach out to the patient again over the next 30 days.        Patient verbalizes understanding of instructions and care plan provided today and agrees to view in MyChart. Active MyChart status and patient understanding of how to access instructions and care plan via MyChart confirmed with patient.    The pharmacy team will reach out to the patient again over the next 7 days.   Verner Chol, [facility]

## 2022-04-08 NOTE — Telephone Encounter (Signed)
Last OV-12/07/2021 Last refill- 11/19/21--180 tabs, 2 refills  Next OV- 05/17/22

## 2022-04-13 ENCOUNTER — Encounter: Payer: Self-pay | Admitting: Family Medicine

## 2022-04-13 ENCOUNTER — Ambulatory Visit (INDEPENDENT_AMBULATORY_CARE_PROVIDER_SITE_OTHER): Payer: Medicare Other | Admitting: Family Medicine

## 2022-04-13 VITALS — BP 140/70 | HR 90 | Temp 97.9°F | Ht 63.0 in | Wt 170.8 lb

## 2022-04-13 DIAGNOSIS — J029 Acute pharyngitis, unspecified: Secondary | ICD-10-CM | POA: Diagnosis not present

## 2022-04-13 DIAGNOSIS — M2550 Pain in unspecified joint: Secondary | ICD-10-CM

## 2022-04-13 MED ORDER — PREDNISONE 20 MG PO TABS
ORAL_TABLET | ORAL | 0 refills | Status: DC
Start: 2022-04-13 — End: 2022-08-13

## 2022-04-13 NOTE — Progress Notes (Signed)
Established Patient Office Visit  Subjective   Patient ID: Alexandra Norman, female    DOB: 04/17/1956  Age: 66 y.o. MRN: [id number]  Chief Complaint  Patient presents with   Sore Throat    Patient complains of sore throat, x3 days   Medication Consultation    HPI   Lynden Ang relates sore throat past few days.  She states that her CPAP is currently not functioning well and she thinks some of this may be related to obstructive sleep apnea.  Denies any fever.  No significant postnasal drainage.  Her main issue is significant arthralgias involving multiple joints.  She feels like she has "inflammation "which is ramped up at this time.  She has done well with brief courses of prednisone in the past and specifically requesting prednisone for a few days.  Her chronic problems include history of atrial septal defect repair back in her early 36s, hypothyroidism, peripheral neuropathy, Charcot-Marie-Tooth disease, osteoporosis.  She is on Prolia injections every 6 months for her osteoporosis.  Past Medical History:  Diagnosis Date   ADD (attention deficit disorder)    Allergy    Arrhythmia    Arthritis    Charcot-Marie-Tooth disease    Depression    Fibromyalgia    Headache(784.0)    Heart murmur    Hyperlipidemia    Osteoarthritis    Osteoporosis    Rotator cuff tear    Thyroid disease    Ulcer    UTI (urinary tract infection)    Past Surgical History:  Procedure Laterality Date   CARDIAC SURGERY  1982   ASD repair    CARPAL TUNNEL RELEASE  2007   bilateral   CATARACT EXTRACTION, BILATERAL Bilateral    FOOT SURGERY  2001 and 2005   bilateral   HERNIA REPAIR     WRIST SURGERY Left 08/12/2017    reports that she has never smoked. She has never used smokeless tobacco. She reports that she does not drink alcohol and does not use drugs. family history includes Alcohol abuse in her father and mother; Arthritis in her father and mother; Breast cancer in her sister; Mental illness in  her mother and sister; Thyroid disease in her mother. Allergies  Allergen Reactions   Penicillins     hives   Statins    Sulfa Antibiotics     Review of Systems  Constitutional:  Negative for chills and fever.  HENT:  Positive for sore throat.   Respiratory:  Negative for cough and shortness of breath.   Cardiovascular:  Negative for chest pain.  Musculoskeletal:  Positive for joint pain.      Objective:     BP 140/70 (BP Location: Left Arm, Patient Position: Sitting, Cuff Size: Normal)   Pulse 90   Temp 97.9 F (36.6 C) (Oral)   Ht 5\' 3"  (1.6 m)   Wt 170 lb 12.8 oz (77.5 kg)   SpO2 97%   BMI 30.26 kg/m    Physical Exam Vitals reviewed.  HENT:     Mouth/Throat:     Comments: No significant posterior pharynx erythema.  No exudate noted. Cardiovascular:     Rate and Rhythm: Normal rate and regular rhythm.  Musculoskeletal:     Cervical back: Neck supple.  Lymphadenopathy:     Cervical: No cervical adenopathy.  Neurological:     Mental Status: She is alert.      No results found for any visits on 04/13/22.    The ASCVD Risk score (Arnett DK,  et al., 2019) failed to calculate for the following reasons:   Cannot find a previous HDL lab   Cannot find a previous total cholesterol lab    Assessment & Plan:   #1 sore throat symptoms.  Question related to obstructive sleep apnea.  Currently not using CPAP due to machine problems.  Would recommend symptomatic treatment.  Be in touch with her pulmonologist regarding CPAP replacement  #2 arthralgias involving multiple joints.  She has history of chronic peripheral neuropathy.  Complaining of arthritis pain in multiple joints.  We discussed recommendations to avoid regular use of prednisone because of her osteoporosis particularly.  We agreed to send in 5 days of prednisone 40 mg once daily but avoid prolonged use.  #3 osteoporosis.  Continue calcium and vitamin D.  Continue Prolia every 6 months.   No follow-ups  on file.    Evelena Peat, MD

## 2022-05-03 DIAGNOSIS — M810 Age-related osteoporosis without current pathological fracture: Secondary | ICD-10-CM

## 2022-05-03 DIAGNOSIS — E785 Hyperlipidemia, unspecified: Secondary | ICD-10-CM

## 2022-05-17 ENCOUNTER — Ambulatory Visit (INDEPENDENT_AMBULATORY_CARE_PROVIDER_SITE_OTHER): Payer: Medicare Other | Admitting: Family Medicine

## 2022-05-17 ENCOUNTER — Encounter: Payer: Self-pay | Admitting: Family Medicine

## 2022-05-17 VITALS — BP 128/72 | HR 85 | Temp 98.0°F | Ht 63.0 in | Wt 167.2 lb

## 2022-05-17 DIAGNOSIS — R5383 Other fatigue: Secondary | ICD-10-CM | POA: Diagnosis not present

## 2022-05-17 DIAGNOSIS — R7989 Other specified abnormal findings of blood chemistry: Secondary | ICD-10-CM | POA: Diagnosis not present

## 2022-05-17 LAB — TSH: TSH: 5.02 u[IU]/mL (ref 0.35–5.50)

## 2022-05-17 LAB — T4, FREE: Free T4: 0.62 ng/dL (ref 0.60–1.60)

## 2022-05-17 NOTE — Progress Notes (Signed)
Established Patient Office Visit  Subjective   Patient ID: Alexandra Norman, female    DOB: [birth year]  Age: 66 y.o. MRN: [id number]  Chief Complaint  Patient presents with   Follow-up    HPI   Seen for medical follow-up.  She has history of elevated TSH.  She does have some fatigue which is more or less chronic.  Her most recent free T4 was normal.  We suspect subclinical hypothyroidism.  Here for repeat thyroid functions today.  No recent reported hair loss.  No cold intolerance.  She previously had difficulty tolerating levothyroxine secondary to flare of arthritis.  She could not get brand-name filled by her insurance.  She has osteoporosis and recently been on Prolia injection.  First injection was in April and she did well.  Denies any recent falls since last visit.  Past Medical History:  Diagnosis Date   ADD (attention deficit disorder)    Allergy    Arrhythmia    Arthritis    Charcot-Marie-Tooth disease    Depression    Fibromyalgia    Headache(784.0)    Heart murmur    Hyperlipidemia    Osteoarthritis    Osteoporosis    Rotator cuff tear    Thyroid disease    Ulcer    UTI (urinary tract infection)    Past Surgical History:  Procedure Laterality Date   CARDIAC SURGERY  1982   ASD repair    CARPAL TUNNEL RELEASE  2007   bilateral   CATARACT EXTRACTION, BILATERAL Bilateral    FOOT SURGERY  2001 and 2005   bilateral   HERNIA REPAIR     WRIST SURGERY Left 08/12/2017    reports that she has never smoked. She has never used smokeless tobacco. She reports that she does not drink alcohol and does not use drugs. family history includes Alcohol abuse in her father and mother; Arthritis in her father and mother; Breast cancer in her sister; Mental illness in her mother and sister; Thyroid disease in her mother. Allergies  Allergen Reactions   Penicillins     hives   Statins    Sulfa Antibiotics     Review of Systems  Constitutional:  Positive for  malaise/fatigue.  Eyes:  Negative for blurred vision.  Respiratory:  Negative for shortness of breath.   Cardiovascular:  Negative for chest pain.  Neurological:  Negative for dizziness, weakness and headaches.      Objective:     BP 128/72 (BP Location: Left Arm, Cuff Size: Normal)   Pulse 85   Temp 98 F (36.7 C) (Oral)   Ht 5\' 3"  (1.6 m)   Wt 167 lb 3.2 oz (75.8 kg)   SpO2 95%   BMI 29.62 kg/m    Physical Exam Constitutional:      Appearance: She is well-developed.  Eyes:     Pupils: Pupils are equal, round, and reactive to light.  Neck:     Thyroid: No thyromegaly.     Vascular: No JVD.  Cardiovascular:     Rate and Rhythm: Normal rate and regular rhythm.     Heart sounds:     No gallop.  Pulmonary:     Effort: Pulmonary effort is normal. No respiratory distress.     Breath sounds: Normal breath sounds. No wheezing or rales.  Musculoskeletal:     Cervical back: Neck supple.  Neurological:     Mental Status: She is alert.      No results found for any visits  on 05/17/22.    The ASCVD Risk score (Arnett DK, et al., 2019) failed to calculate for the following reasons:   Cannot find a previous HDL lab   Cannot find a previous total cholesterol lab    Assessment & Plan:   Problem List Items Addressed This Visit   None Visit Diagnoses     Abnormal TSH    -  Primary   Relevant Orders   TSH   T4, Free   Fatigue, unspecified type       Relevant Orders   TSH   T4, Free     -Probable subclinical hypothyroidism.  Recheck TSH and free T4. -Set up 40-month follow-up  Return in about 6 months (around 11/17/2022).    Evelena Peat, MD

## 2022-05-19 ENCOUNTER — Encounter: Payer: Self-pay | Admitting: Family Medicine

## 2022-05-28 ENCOUNTER — Ambulatory Visit (INDEPENDENT_AMBULATORY_CARE_PROVIDER_SITE_OTHER): Payer: Medicare Other

## 2022-05-28 VITALS — Ht 63.0 in | Wt 167.0 lb

## 2022-05-28 DIAGNOSIS — Z0000 Encounter for general adult medical examination without abnormal findings: Secondary | ICD-10-CM

## 2022-05-28 NOTE — Progress Notes (Signed)
Subjective:   Maryclare BeanCatherine Kozlowski is a 66 y.o. female who presents for Medicare Annual (Subsequent) preventive examination.  Review of Systems    Virtual Visit via Telephone Note  I connected with  Maryclare BeanCatherine Coffin on 05/28/22 at  1:30 PM EDT by telephone and verified that I am speaking with the correct person using two identifiers.  Location: Patient: Home Provider: Office Persons participating in the virtual visit: patient/Nurse Health Advisor   I discussed the limitations, risks, security and privacy concerns of performing an evaluation and management service by telephone and the availability of in person appointments. The patient expressed understanding and agreed to proceed.  Interactive audio and video telecommunications were attempted between this nurse and patient, however failed, due to patient having technical difficulties OR patient did not have access to video capability.  We continued and completed visit with audio only.  Some vital signs may be absent or patient reported.   Tillie RungBeverly W Foxx Klarich, LPN  Cardiac Risk Factors include: advanced age (>[id number], 90>65 women)     Objective:    Today's Vitals   05/28/22 1507  Weight: 167 lb (75.8 kg)  Height: 5\' 3"  (1.6 m)   Body mass index is 29.58 kg/m.     05/28/2022    3:25 PM 05/22/2021   11:33 AM 05/11/2021   11:24 PM 02/08/2021    7:51 PM 06/18/2020   11:19 AM 05/21/2020    2:18 PM 04/12/2016    8:03 PM  Advanced Directives  Does Patient Have a Medical Advance Directive? Yes Yes Yes No Yes Yes No  Type of Estate agentAdvance Directive Healthcare Power of [place]orney;Living will Healthcare Power of [place];Living will Out of facility DNR (pink MOST or yellow form);Healthcare Power of [place] In[place]torney  Healthcare Power of [place]orney;Living will Healthcare Power of [place]orney;Living will   Does patient want to make changes to medical advance directive? No - Patient declined     No - Patient declined   Copy of Healthcare Power of Attorney in Chart? Yes -  validated most recent copy scanned in chart (See row information) Yes - validated most recent copy scanned in chart (See row information)    No - copy requested   Would patient like information on creating a medical advance directive?    No - Patient declined   No - patient declined information    Current Medications (verified) Outpatient Encounter Medications as of 05/28/2022  Medication Sig   Ascorbic Acid (VITAMIN C) 1000 MG tablet Take 1,000 mg by mouth daily.   baclofen (LIORESAL) 10 MG tablet Take 1 tablet (10 mg total) by mouth 3 (three) times daily.   buPROPion (WELLBUTRIN XL) 150 MG 24 hr tablet Take 450 mg by mouth daily.   calcium carbonate (OSCAL) 1500 (600 Ca) MG TABS tablet Take 1,500 mg by mouth daily with breakfast.   Coenzyme Q10 (CO Q10) 100 MG CAPS Take 100 mg by mouth daily.   Cranberry 400 MG CAPS Take 400 mg by mouth as needed.   denosumab (PROLIA) 60 MG/ML SOSY injection Inject 60 mg into the skin every 6 (six) months.   esomeprazole (NEXIUM) 20 MG capsule Take 20 mg by mouth daily at 12 noon.   fluticasone (FLONASE) 50 MCG/ACT nasal spray SPRAY 2 SPRAYS INTO EACH NOSTRIL EVERY DAY (Patient taking differently: Place 2 sprays into both nostrils daily.)   gabapentin (NEURONTIN) 800 MG tablet TAKE 1 TABLET BY MOUTH FOUR TIMES A DAY   lamoTRIgine (LAMICTAL) 150 MG tablet Take 150 mg by mouth  daily.    loratadine (CLARITIN) 10 MG tablet Take 10 mg by mouth daily.   Multiple Vitamins-Minerals (MULTIVITAMIN & MINERAL PO) Take 1 tablet by mouth daily.   Nutritional Supplements (ADULT GROWTH HORMONE SUPPORT PO) Take 1 capsule by mouth daily.   pantoprazole (PROTONIX) 40 MG tablet Take 1 tablet (40 mg total) by mouth daily.   predniSONE (DELTASONE) 20 MG tablet Take two tablets by mouth once daily for 5 days   Probiotic Product (PROBIOTIC COLON SUPPORT) CAPS Take 1 capsule by mouth daily.   propranolol (INDERAL) 10 MG tablet Take 10 mg by mouth as needed.   QUEtiapine (SEROQUEL)  200 MG tablet Take 200 mg by mouth at bedtime.   traMADol (ULTRAM) 50 MG tablet TAKE TWO TABLETS BY MOUTH EVERY 8 HOURS FOR CHRONIC PAIN.   valbenazine (INGREZZA) 40 MG capsule Take 40 mg by mouth daily.   VITAMIN D, CHOLECALCIFEROL, PO Take 5,000 Units by mouth daily.   No facility-administered encounter medications on file as of 05/28/2022.    Allergies (verified) Penicillins, Statins, and Sulfa antibiotics   History: Past Medical History:  Diagnosis Date   ADD (attention deficit disorder)    Allergy    Arrhythmia    Arthritis    Charcot-Marie-Tooth disease    Depression    Fibromyalgia    Headache(784.0)    Heart murmur    Hyperlipidemia    Osteoarthritis    Osteoporosis    Rotator cuff tear    Thyroid disease    Ulcer    UTI (urinary tract infection)    Past Surgical History:  Procedure Laterality Date   CARDIAC SURGERY  1982   ASD repair    CARPAL TUNNEL RELEASE  2007   bilateral   CATARACT EXTRACTION, BILATERAL Bilateral    FOOT SURGERY  2001 and 2005   bilateral   HERNIA REPAIR     WRIST SURGERY Left 08/12/2017   Family History  Problem Relation Age of Onset   Alcohol abuse Mother    Arthritis Mother    Mental illness Mother        Suicide in 31   Thyroid disease Mother    Alcohol abuse Father    Arthritis Father    Mental illness Sister        Suicide in 53   Breast cancer Sister    Social History   Socioeconomic History   Marital status: Divorced    Spouse name: Not on file   Number of children: 2   Years of education: Not on file   Highest education level: Not on file  Occupational History   Occupation: Sports coach  Tobacco Use   Smoking status: Never   Smokeless tobacco: Never  Vaping Use   Vaping Use: Never used  Substance and Sexual Activity   Alcohol use: No    Alcohol/week: 0.0 standard drinks of alcohol   Drug use: No   Sexual activity: Not on file  Other Topics Concern   Not on file  Social History Narrative   Works for  the post office, 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 Has two grandchildren    Social Determinants of Health   Financial Resource Strain: Low Risk  (05/28/2022)   Overall Financial Resource Strain (CARDIA)    Difficulty of Paying Living Expenses: Not hard at all  Food Insecurity: No Food Insecurity (05/28/2022)   Hunger Vital Sign    Worried About Running  Out of Food in the Last Year: Never true    Ran Out of Food in the Last Year: Never true  Transportation Needs: No Transportation Needs (05/28/2022)   PRAPARE - Administrator, Civil Service (Medical): No    Lack of Transportation (Non-Medical): No  Physical Activity: Insufficiently Active (05/28/2022)   Exercise Vital Sign    Days of Exercise per Week: 5 days    Minutes of Exercise per Session: 10 min  Stress: No Stress Concern Present (05/28/2022)   [employer] of Occupational Health - Occupational Stress Questionnaire    Feeling of Stress : Not at all  Social Connections: Moderately Isolated (05/28/2022)   Social Connection and Isolation Panel [NHANES]    Frequency of Communication with Friends and Family: More than three times a week    Frequency of Social Gatherings with Friends and Family: More than three times a week    Attends Religious Services: More than 4 times per year    Active Member of [employer] or Organizations: No    Attends Banker Meetings: Never    Marital Status: Divorced     Clinical Intake:   Diabetic?  No   Activities of Daily Living    05/28/2022    3:17 PM  In your present state of health, do you have any difficulty performing the following activities:  Hearing? 0  Vision? 0  Difficulty concentrating or making decisions? 0  Walking or climbing stairs? 0  Dressing or bathing? 0  Doing errands, shopping? 0  Preparing Food and eating ? N  Using the Toilet? N  In the past six months, have you accidently leaked urine? N  Do you have  problems with loss of bowel control? N  Managing your Medications? N  Managing your Finances? N  Housekeeping or managing your Housekeeping? Y  Comment Neighbor assist    Patient Care Team: Kristian Covey, MD as PCP - General (Family Medicine) Verner Chol, [facility] as Pharmacist (Pharmacist)  Indicate any recent Medical Services you may have received from other than [facility] providers in the past year (date may be approximate).     Assessment:   This is a routine wellness examination for Chiyo.  Hearing/Vision screen Hearing Screening - Comments:: No hearing difficulty Vision Screening - Comments:: Wears reading glasses. Followed by [facility]  Dietary issues and exercise activities discussed: Current Exercise Habits: Home exercise routine, Type of exercise: walking, Time (Minutes): 10, Frequency (Times/Week): 5, Weekly Exercise (Minutes/Week): 50, Intensity: Moderate, Exercise limited by: None identified   Goals Addressed               This Visit's Progress     Patient Stated (pt-stated)        I would like to try to stay functional.       Depression Screen    05/28/2022    3:14 PM 04/13/2022    3:09 PM 12/07/2021    2:33 PM 05/22/2021   11:35 AM 05/22/2021   11:31 AM 05/21/2020    2:22 PM 12/06/2018    2:36 PM  PHQ 2/9 Scores  PHQ - 2 Score 0 0 0 0 0 2 6  PHQ- 9 Score 0 3 2   3 20     Fall Risk    05/28/2022    3:22 PM 05/17/2022    1:17 PM 12/07/2021    2:33 PM 10/07/2021    2:22 PM 05/22/2021   11:34 AM  Fall Risk  Falls in the past year? 1 1 1 1 1   Number falls in past yr: 0 1 1 1 1   Injury with Fall? 1 1 1 1 1   Comment Swollen Rt knee. No medical attention needed      Risk for fall due to : Impaired balance/gait No Fall Risks  History of fall(s) History of fall(s)  Risk for fall due to: Comment     uses cane  Follow up Falls prevention discussed Falls evaluation completed  Falls evaluation completed Education provided    FALL RISK PREVENTION  PERTAINING TO THE HOME:  Any stairs in or around the home? Yes  If so, are there any without handrails? No  Home free of loose throw rugs in walkways, pet beds, electrical cords, etc? Yes  Adequate lighting in your home to reduce risk of falls? Yes   ASSISTIVE DEVICES UTILIZED TO PREVENT FALLS:  Life alert? No  Use of a cane, walker or w/c? Yes  Grab bars in the bathroom? Yes  Shower chair or bench in shower? Yes  Elevated toilet seat or a handicapped toilet?   TIMED UP AND GO:  Was the test performed? No . Audio Visit   Cognitive Function:        05/28/2022    3:25 PM 05/21/2020    2:27 PM  6CIT Screen  What Year? 0 points 0 points  What month? 0 points 0 points  What time? 0 points 0 points  Count back from 20 0 points 0 points  Months in reverse 0 points 0 points  Repeat phrase 0 points 0 points  Total Score 0 points 0 points    Immunizations Immunization History  Administered Date(s) Administered   Fluad Quad(high Dose 65+) 07/10/2021   Influenza Split 07/12/2012   Influenza,inj,Quad PF,6+ Mos 07/10/2013, 08/06/2014, 11/24/2015, 09/14/2016, 06/14/2017, 09/05/2018, 07/09/2019   Influenza-Unspecified 06/22/2017   Moderna Sars-Covid-2 Vaccination 10/07/2020   PFIZER Comirnaty(Gray Top)Covid-19 Tri-Sucrose Vaccine 02/18/2021   PFIZER(Purple Top)SARS-COV-2 Vaccination 01/10/2020, 02/04/2020   Tdap 10/05/2011   Unspecified SARS-COV-2 Vaccination 01/10/2020, 02/04/2020    TDAP status: Due, Education has been provided regarding the importance of this vaccine. Advised may receive this vaccine at local pharmacy or Health Dept. Aware to provide a copy of the vaccination record if obtained from local pharmacy or Health Dept. Verbalized acceptance and understanding.  Flu Vaccine status: Up to date  Pneumococcal vaccine status: Due, Education has been provided regarding the importance of this vaccine. Advised may receive this vaccine at local pharmacy or Health Dept. Aware  to provide a copy of the vaccination record if obtained from local pharmacy or Health Dept. Verbalized acceptance and understanding.  Covid-19 vaccine status: Completed vaccines  Qualifies for Shingles Vaccine? Yes   Zostavax completed No   Shingrix Completed?: No.    Education has been provided regarding the importance of this vaccine. Patient has been advised to call insurance company to determine out of pocket expense if they have not yet received this vaccine. Advised may also receive vaccine at local pharmacy or Health Dept. Verbalized acceptance and understanding.  Screening Tests Health Maintenance  Topic Date Due   MAMMOGRAM  10/04/2002   INFLUENZA VACCINE  05/04/2022   COVID-19 Vaccine (7 - Mixed Product risk series) 06/13/2022 (Originally 04/15/2021)   Zoster Vaccines- Shingrix (1 of 2) 08/28/2022 (Originally 10/18/1974)   Pneumonia Vaccine 11+ Years old (1 - PCV) 05/29/2023 (Originally 10/18/2020)   TETANUS/TDAP  05/29/2023 (Originally 10/04/2021)   COLONOSCOPY (Pts 45-86yrs Insurance coverage will  need to be confirmed)  07/28/2025   DEXA SCAN  Completed   Hepatitis C Screening  Completed   HPV VACCINES  Aged Out    Health Maintenance  Health Maintenance Due  Topic Date Due   MAMMOGRAM  10/04/2002   INFLUENZA VACCINE  05/04/2022    Colorectal cancer screening: Type of screening: Colonoscopy. Completed 07/29/15. Repeat every 10 years  Mammogram status: Ordered Patient deferred. Pt provided with contact info and advised to call to schedule appt.     Lung Cancer Screening: (Low Dose CT Chest recommended if Age 61-80 years, 30 pack-year currently smoking OR have quit w/in 15years.) does not qualify.     Additional Screening:  Hepatitis C Screening: does qualify; Completed 11/24/15  Vision Screening: Recommended annual ophthalmology exams for early detection of glaucoma and other disorders of the eye. Is the patient up to date with their annual eye exam?  Yes  Who is  the provider or what is the name of the office in which the patient attends annual eye exams? [facility] If pt is not established with a provider, would they like to be referred to a provider to establish care? No .   Dental Screening: Recommended annual dental exams for proper oral hygiene  Community Resource Referral / Chronic Care Management:  CRR required this visit?  No   CCM required this visit?  No      Plan:     I have personally reviewed and noted the following in the patient's chart:   Medical and social history Use of alcohol, tobacco or illicit drugs  Current medications and supplements including opioid prescriptions. Patient is currently taking opioid prescriptions. Information provided to patient regarding non-opioid alternatives. Patient advised to discuss non-opioid treatment plan with their provider. Functional ability and status Nutritional status Physical activity Advanced directives List of other physicians Hospitalizations, surgeries, and ER visits in previous 12 months Vitals Screenings to include cognitive, depression, and falls Referrals and appointments  In addition, I have reviewed and discussed with patient certain preventive protocols, quality metrics, and best practice recommendations. A written personalized care plan for preventive services as well as general preventive health recommendations were provided to patient.     Tillie Rung, LPN   [id number]   Nurse Notes: None

## 2022-05-28 NOTE — Patient Instructions (Addendum)
Alexandra Norman , Thank you for taking time to come for your Medicare Wellness Visit. I appreciate your ongoing commitment to your health goals. Please review the following plan we discussed and let me know if I can assist you in the future.   Screening recommendations/referrals: Colonoscopy: Done Recommended yearly ophthalmology/optometry visit for glaucoma screening and checkup Recommended yearly dental visit for hygiene and checkup  Vaccinations: Influenza vaccine: Done Up to date Pneumococcal vaccine: Due Tdap vaccine: Due Shingles vaccine: Due   Covid-19: Up to date  Advanced directives: Copy on file  Conditions/risks identified: None  Next appointment: Follow up in one year for your annual wellness visit.   Preventive Care 66 Years and Older, Female  Preventive care refers to lifestyle choices and visits with your health care provider that can promote health and wellness. What does preventive care include? A yearly physical exam. This is also called an annual well check. Dental exams once or twice a year. Routine eye exams. Ask your health care provider how often you should have your eyes checked. Personal lifestyle choices, including: Daily care of your teeth and gums. Regular physical activity. Eating a healthy diet. Avoiding tobacco and drug use. Limiting alcohol use. Practicing safe sex. Taking low doses of aspirin every day. Taking vitamin and mineral supplements as recommended by your health care provider. What happens during an annual well check? The services and screenings done by your health care provider during your annual well check will depend on your age, overall health, lifestyle risk factors, and family history of disease. Counseling  Your health care provider may ask you questions about your: Alcohol use. Tobacco use. Drug use. Emotional well-being. Home and relationship well-being. Sexual activity. Eating habits. History of falls. Memory and ability to  understand (cognition). Work and work Astronomer. Screening  You may have the following tests or measurements: Height, weight, and BMI. Blood pressure. Lipid and cholesterol levels. These may be checked every 5 years, or more frequently if you are over 66 years old. Skin check. Lung cancer screening. You may have this screening every year starting at age 66 if you have a 30-pack-year history of smoking and currently smoke or have quit within the past 15 years. Fecal occult blood test (FOBT) of the stool. You may have this test every year starting at age 66. Flexible sigmoidoscopy or colonoscopy. You may have a sigmoidoscopy every 5 years or a colonoscopy every 10 years starting at age 66. Prostate cancer screening. Recommendations will vary depending on your family history and other risks. Hepatitis C blood test. Hepatitis B blood test. Sexually transmitted disease (STD) testing. Diabetes screening. This is done by checking your blood sugar (glucose) after you have not eaten for a while (fasting). You may have this done every 1-3 years. Abdominal aortic aneurysm (AAA) screening. You may need this if you are a current or former smoker. Osteoporosis. You may be screened starting at age 55 if you are at high risk. Talk with your health care provider about your test results, treatment options, and if necessary, the need for more tests. Vaccines  Your health care provider may recommend certain vaccines, such as: Influenza vaccine. This is recommended every year. Tetanus, diphtheria, and acellular pertussis (Tdap, Td) vaccine. You may need a Td booster every 10 years. Zoster vaccine. You may need this after age 66. Pneumococcal 13-valent conjugate (PCV13) vaccine. One dose is recommended after age 60. Pneumococcal polysaccharide (PPSV23) vaccine. One dose is recommended after age 21. Talk to your health  care provider about which screenings and vaccines you need and how often you need them. This  information is not intended to replace advice given to you by your health care provider. Make sure you discuss any questions you have with your health care provider. Document Released: 10/17/2015 Document Revised: 06/09/2016 Document Reviewed: 07/22/2015 Elsevier Interactive Patient Education  2017 [place] Prevention in the Home Falls can cause injuries. They can happen to people of all ages. There are many things you can do to make your home safe and to help prevent falls. What can I do on the outside of my home? Regularly fix the edges of walkways and driveways and fix any cracks. Remove anything that might make you trip as you walk through a door, such as a raised step or threshold. Trim any bushes or trees on the path to your home. Use bright outdoor lighting. Clear any walking paths of anything that might make someone trip, such as rocks or tools. Regularly check to see if handrails are loose or broken. Make sure that both sides of any steps have handrails. Any raised decks and porches should have guardrails on the edges. Have any leaves, snow, or ice cleared regularly. Use sand or salt on walking paths during winter. Clean up any spills in your garage right away. This includes oil or grease spills. What can I do in the bathroom? Use night lights. Install grab bars by the toilet and in the tub and shower. Do not use towel bars as grab bars. Use non-skid mats or decals in the tub or shower. If you need to sit down in the shower, use a plastic, non-slip stool. Keep the floor dry. Clean up any water that spills on the floor as soon as it happens. Remove soap buildup in the tub or shower regularly. Attach bath mats securely with double-sided non-slip rug tape. Do not have throw rugs and other things on the floor that can make you trip. What can I do in the bedroom? Use night lights. Make sure that you have a light by your bed that is easy to reach. Do not use any sheets or  blankets that are too big for your bed. They should not hang down onto the floor. Have a firm chair that has side arms. You can use this for support while you get dressed. Do not have throw rugs and other things on the floor that can make you trip. What can I do in the kitchen? Clean up any spills right away. Avoid walking on wet floors. Keep items that you use a lot in easy-to-reach places. If you need to reach something above you, use a strong step stool that has a grab bar. Keep electrical cords out of the way. Do not use floor polish or wax that makes floors slippery. If you must use wax, use non-skid floor wax. Do not have throw rugs and other things on the floor that can make you trip. What can I do with my stairs? Do not leave any items on the stairs. Make sure that there are handrails on both sides of the stairs and use them. Fix handrails that are broken or loose. Make sure that handrails are as long as the stairways. Check any carpeting to make sure that it is firmly attached to the stairs. Fix any carpet that is loose or worn. Avoid having throw rugs at the top or bottom of the stairs. If you do have throw rugs, attach them to  the floor with carpet tape. Make sure that you have a light switch at the top of the stairs and the bottom of the stairs. If you do not have them, ask someone to add them for you. What else can I do to help prevent falls? Wear shoes that: Do not have high heels. Have rubber bottoms. Are comfortable and fit you well. Are closed at the toe. Do not wear sandals. If you use a stepladder: Make sure that it is fully opened. Do not climb a closed stepladder. Make sure that both sides of the stepladder are locked into place. Ask someone to hold it for you, if possible. Clearly mark and make sure that you can see: Any grab bars or handrails. First and last steps. Where the edge of each step is. Use tools that help you move around (mobility aids) if they are  needed. These include: Canes. Walkers. Scooters. Crutches. Turn on the lights when you go into a dark area. Replace any light bulbs as soon as they burn out. Set up your furniture so you have a clear path. Avoid moving your furniture around. If any of your floors are uneven, fix them. If there are any pets around you, be aware of where they are. Review your medicines with your doctor. Some medicines can make you feel dizzy. This can increase your chance of falling. Ask your doctor what other things that you can do to help prevent falls. This information is not intended to replace advice given to you by your health care provider. Make sure you discuss any questions you have with your health care provider. Document Released: 07/17/2009 Document Revised: 02/26/2016 Document Reviewed: 10/25/2014 Elsevier Interactive Patient Education  2017 [employer].

## 2022-07-07 ENCOUNTER — Telehealth: Payer: Self-pay | Admitting: Pharmacist

## 2022-07-07 NOTE — Chronic Care Management (AMB) (Signed)
    Chronic Care Management Pharmacy Assistant   Name: Alexandra Norman  MRN: [id number] DOB: 02-14-1956  Reason for Encounter: Gaps report for mammogram.  Spoke with patient, she states she doesn't want to have mammograms and chooses not to get mammograms.  [place] Pharmacist Assistant [phone]

## 2022-07-12 ENCOUNTER — Emergency Department (HOSPITAL_BASED_OUTPATIENT_CLINIC_OR_DEPARTMENT_OTHER)
Admission: EM | Admit: 2022-07-12 | Discharge: 2022-07-12 | Disposition: A | Discharge status: Home / Self Care | Payer: Medicare Other | Attending: Emergency Medicine | Admitting: Emergency Medicine

## 2022-07-12 ENCOUNTER — Emergency Department (HOSPITAL_BASED_OUTPATIENT_CLINIC_OR_DEPARTMENT_OTHER): Payer: Medicare Other

## 2022-07-12 ENCOUNTER — Encounter (HOSPITAL_BASED_OUTPATIENT_CLINIC_OR_DEPARTMENT_OTHER): Payer: Self-pay | Admitting: Emergency Medicine

## 2022-07-12 ENCOUNTER — Other Ambulatory Visit: Payer: Self-pay

## 2022-07-12 ENCOUNTER — Emergency Department (HOSPITAL_BASED_OUTPATIENT_CLINIC_OR_DEPARTMENT_OTHER): Payer: Medicare Other | Admitting: Radiology

## 2022-07-12 DIAGNOSIS — M546 Pain in thoracic spine: Secondary | ICD-10-CM | POA: Diagnosis not present

## 2022-07-12 DIAGNOSIS — S32010A Wedge compression fracture of first lumbar vertebra, initial encounter for closed fracture: Secondary | ICD-10-CM

## 2022-07-12 DIAGNOSIS — W19XXXA Unspecified fall, initial encounter: Secondary | ICD-10-CM | POA: Insufficient documentation

## 2022-07-12 DIAGNOSIS — S32018A Other fracture of first lumbar vertebra, initial encounter for closed fracture: Secondary | ICD-10-CM | POA: Diagnosis not present

## 2022-07-12 DIAGNOSIS — S3992XA Unspecified injury of lower back, initial encounter: Secondary | ICD-10-CM | POA: Diagnosis present

## 2022-07-12 LAB — CBC WITH DIFFERENTIAL/PLATELET
Abs Immature Granulocytes: 0.06 10*3/uL (ref 0.00–0.07)
Basophils Absolute: 0 10*3/uL (ref 0.0–0.1)
Basophils Relative: 0 %
Eosinophils Absolute: 0.4 10*3/uL (ref 0.0–0.5)
Eosinophils Relative: 4 %
HCT: 38.4 % (ref 36.0–46.0)
Hemoglobin: 12 g/dL (ref 12.0–15.0)
Immature Granulocytes: 1 %
Lymphocytes Relative: 20 %
Lymphs Abs: 1.9 10*3/uL (ref 0.7–4.0)
MCH: 28.7 pg (ref 26.0–34.0)
MCHC: 31.3 g/dL (ref 30.0–36.0)
MCV: 91.9 fL (ref 80.0–100.0)
Monocytes Absolute: 0.6 10*3/uL (ref 0.1–1.0)
Monocytes Relative: 6 %
Neutro Abs: 6.4 10*3/uL (ref 1.7–7.7)
Neutrophils Relative %: 69 %
Platelets: 260 10*3/uL (ref 150–400)
RBC: 4.18 MIL/uL (ref 3.87–5.11)
RDW: 14.4 % (ref 11.5–15.5)
WBC: 9.2 10*3/uL (ref 4.0–10.5)
nRBC: 0 % (ref 0.0–0.2)

## 2022-07-12 LAB — BASIC METABOLIC PANEL
Anion gap: 8 (ref 5–15)
BUN: 12 mg/dL (ref 8–23)
CO2: 29 mmol/L (ref 22–32)
Calcium: 9 mg/dL (ref 8.9–10.3)
Chloride: 105 mmol/L (ref 98–111)
Creatinine, Ser: 0.67 mg/dL (ref 0.44–1.00)
GFR, Estimated: >60 mL/min (ref >60)
Glucose, Bld: 78 mg/dL (ref 70–99)
Potassium: 3.8 mmol/L (ref 3.5–5.1)
Sodium: 142 mmol/L (ref 135–145)

## 2022-07-12 MED ORDER — GABAPENTIN 100 MG PO CAPS
800.0000 mg | ORAL_CAPSULE | Freq: Once | ORAL | Status: DC
  Filled 2022-07-12: qty 8

## 2022-07-12 MED ORDER — MORPHINE SULFATE (PF) 4 MG/ML IV SOLN
4.0000 mg | Freq: Once | INTRAVENOUS | Status: AC
  Administered 2022-07-12: 4 mg via INTRAVENOUS
  Filled 2022-07-12: qty 1

## 2022-07-12 MED ORDER — HYDROCODONE-ACETAMINOPHEN 5-325 MG PO TABS: 1.0000 | ORAL_TABLET | ORAL | 0 refills | Status: DC | PRN

## 2022-07-12 MED ORDER — ONDANSETRON HCL 4 MG/2ML IJ SOLN
4.0000 mg | Freq: Once | INTRAMUSCULAR | Status: AC
  Administered 2022-07-12: 4 mg via INTRAVENOUS
  Filled 2022-07-12: qty 2

## 2022-07-12 MED ORDER — LIDOCAINE 5 % EX PTCH
1.0000 | MEDICATED_PATCH | CUTANEOUS | Status: DC
Start: 2022-07-12 — End: 2022-07-12
  Administered 2022-07-12: 1 via TRANSDERMAL
  Filled 2022-07-12: qty 1

## 2022-07-12 NOTE — ED Provider Notes (Signed)
[facility] EMERGENC[facility] DEPT Provider Note   CSN: [id number] Arrival date & time: 07/12/22  [id number]     History  Chief Complaint  Patient presents with   Alexandra Norman    Sakile Filar is a 66 y.o. female.  66 year old female brought in by EMS from home after a fall which occurred 2 days ago.  Patient reports history of Charcot-Marie-Tooth, states that she has periodic falls, had a similar incident 2 days ago causing her to fall landing on her buttocks with pain now in her right lower back and right lower ribs.  Reports history of osteoporosis, fall 1 year ago resulting in left-sided rib fractures.  Also past medical history of hyperlipidemia, arrhythmia, MDD, fibromyalgia, osteoarthritis.  Did not hit head, did not lose consciousness, not anticoagulated.  Was able to stand independently after the fall and was ambulatory.  Patient took her gabapentin, tramadol, baclofen this morning however was in too much pain to be able to leave her house resulting in her calling 911.       Home Medications Prior to Admission medications   Medication Sig Start Date End Date Taking? Authorizing Provider  HYDROcodone-acetaminophen (NORCO/VICODIN) 5-325 MG tablet Take 1 tablet by mouth every 4 (four) hours as needed. 07/12/22  Yes Tacy Learn, PA-C  Ascorbic Acid (VITAMIN C) 1000 MG tablet Take 1,000 mg by mouth daily.    [provider]  baclofen (LIORESAL) 10 MG tablet Take 1 tablet (10 mg total) by mouth 3 (three) times daily. 07/10/21   Burchette, Alinda Sierras, MD  buPROPion (WELLBUTRIN XL) 150 MG 24 hr tablet Take 450 mg by mouth daily. 12/28/20   [provider]  calcium carbonate (OSCAL) 1500 (600 Ca) MG TABS tablet Take 1,500 mg by mouth daily with breakfast.    [provider]  Coenzyme Q10 (CO Q10) 100 MG CAPS Take 100 mg by mouth daily.    [provider]  Cranberry 400 MG CAPS Take 400 mg by mouth as needed.    [provider]  denosumab (PROLIA)  60 MG/ML SOSY injection Inject 60 mg into the skin every 6 (six) months.    [provider]  esomeprazole (NEXIUM) 20 MG capsule Take 20 mg by mouth daily at 12 noon.    [provider]  fluticasone (FLONASE) 50 MCG/ACT nasal spray SPRAY 2 SPRAYS INTO EACH NOSTRIL EVERY DAY Patient taking differently: Place 2 sprays into both nostrils daily. 08/15/17   Burchette, Alinda Sierras, MD  gabapentin (NEURONTIN) 800 MG tablet TAKE 1 TABLET BY MOUTH FOUR TIMES A DAY 03/25/22   Burchette, Alinda Sierras, MD  lamoTRIgine (LAMICTAL) 150 MG tablet Take 150 mg by mouth daily.  10/18/19   [provider]  loratadine (CLARITIN) 10 MG tablet Take 10 mg by mouth daily.    [provider]  Multiple Vitamins-Minerals (MULTIVITAMIN & MINERAL PO) Take 1 tablet by mouth daily.    [provider]  Nutritional Supplements (ADULT GROWTH HORMONE SUPPORT PO) Take 1 capsule by mouth daily.    [provider]  pantoprazole (PROTONIX) 40 MG tablet Take 1 tablet (40 mg total) by mouth daily. 04/07/22   Burchette, Alinda Sierras, MD  predniSONE (DELTASONE) 20 MG tablet Take two tablets by mouth once daily for 5 days 04/13/22   Eulas Post, MD  Probiotic Product (PROBIOTIC COLON SUPPORT) CAPS Take 1 capsule by mouth daily.    [provider]  propranolol (INDERAL) 10 MG tablet Take 10 mg by mouth as needed.  09/27/21   [provider]  QUEtiapine (SEROQUEL) 200 MG tablet Take 200 mg by mouth at bedtime.    [provider]  traMADol (ULTRAM) 50 MG tablet TAKE TWO TABLETS BY MOUTH EVERY 8 HOURS FOR CHRONIC PAIN. 04/08/22   Burchette, Alinda Sierras, MD  valbenazine (INGREZZA) 40 MG capsule Take 40 mg by mouth daily.    [provider]  VITAMIN D, CHOLECALCIFEROL, PO Take 5,000 Units by mouth daily.    [provider]      Allergies    Penicillins, Statins, and Sulfa antibiotics    Review of Systems   Review of Systems Negative except as per HPI Physical  Exam Updated Vital Signs BP 137/85   Pulse 64   Temp 98.3 F (36.8 C) (Oral)   Resp 15   Ht 5\' 3"  (1.6 m)   Wt 79.4 kg   SpO2 96%   BMI 31.00 kg/m  Physical Exam Vitals and nursing note reviewed.  Constitutional:      General: She is not in acute distress.    Appearance: She is well-developed. She is not diaphoretic.  HENT:     Head: Normocephalic and atraumatic.  Pulmonary:     Effort: Pulmonary effort is normal.  Chest:     Chest wall: No tenderness or crepitus.  Abdominal:     Palpations: Abdomen is soft.     Tenderness: There is no abdominal tenderness.  Musculoskeletal:        General: Tenderness present. No swelling or deformity.     Cervical back: Neck supple. No tenderness.     Thoracic back: Tenderness present. No bony tenderness.     Lumbar back: Tenderness and bony tenderness present.       Back:     Right lower leg: No edema.     Left lower leg: No edema.     Comments: No pain with logroll of hips, does have pain over right hip laterally.  No step-off, no deformity, no crepitus  Skin:    General: Skin is warm and dry.     Findings: No bruising, erythema or rash.  Neurological:     Mental Status: She is alert and oriented to person, place, and time.     Sensory: No sensory deficit.     Motor: No weakness.  Psychiatric:        Behavior: Behavior normal.     ED Results / Procedures / Treatments   Labs (all labs ordered are listed, but only abnormal results are displayed) Labs Reviewed  BASIC METABOLIC PANEL  CBC WITH DIFFERENTIAL/PLATELET    EKG None  Radiology CT Lumbar Spine Wo Contrast  Result Date: 07/12/2022 CLINICAL DATA:  Compression fracture, lumbar L1 compression fracture EXAM: CT LUMBAR SPINE WITHOUT CONTRAST TECHNIQUE: Multidetector CT imaging of the lumbar spine was performed without intravenous contrast administration. Multiplanar CT image reconstructions were also generated. RADIATION DOSE REDUCTION: This exam was performed  according to the departmental dose-optimization program which includes automated exposure control, adjustment of the mA and/or kV according to patient size and/or use of iterative reconstruction technique. COMPARISON:  None Available. FINDINGS: Segmentation: 5 lumbar type vertebral bodies. Alignment: No substantial sagittal subluxation. Vertebrae: L1 compression fracture with approximately 30-40% height loss. Linear sclerosis through the inferior aspect of the vertebral body with involvement of the posterior cortex. Also, some buckling of the anterior cortex and slight bony retropulsion without significant canal stenosis. Paraspinal and other soft tissues: Negative. Disc levels: Mild multilevel degenerative disc disease, greatest  at L4-L5 where there is right eccentric disc height loss and endplate sclerosis. Foraminal stenosis is greatest on the right at L4-L5. Lower lumbar facet arthropathy. IMPRESSION: 1. L1 compression fracture with approximately 30-40% height loss, described above. While age-indeterminate, this fracture could be partly acute/recent given reported recent fall and back pain. An MRI could further evaluate for bone marrow edema if clinically warranted. Slight bony retropulsion without significant canal stenosis. 2. Right eccentric L4-L5 degenerative changes. Electronically Signed   By: Margaretha Sheffield M.D.   On: 07/12/2022 14:18   DG Thoracic Spine 2 View  Result Date: 07/12/2022 CLINICAL DATA:  Fall, back pain EXAM: THORACIC SPINE 2 VIEWS COMPARISON:  None Available. FINDINGS: There is no evidence of thoracic spine fracture. Mild kyphoscoliosis. Diffuse osteopenia. No other significant bone abnormalities are identified. Compression deformity of L1 vertebral body, as described previously in lumbar spine radiographs. IMPRESSION: 1. No evidence of thoracic spine fracture. 2. Compression deformity of L1 vertebral body, as described previously in lumbar spine radiographs. 3. Osteopenia.   Sternotomy wires are intact. Electronically Signed   By: Keane Police D.O.   On: 07/12/2022 11:21   DG Hip Unilat With Pelvis 2-3 Views Right  Result Date: 07/12/2022 CLINICAL DATA:  Fall, pain EXAM: DG HIP (WITH OR WITHOUT PELVIS) 2-3V RIGHT COMPARISON:  None Available. FINDINGS: There is no evidence of hip fracture or dislocation. Sacroiliac joints are intact. Mild degenerative changes of the pubic symphysis. Moderate degenerate disc disease of the lumbar spine at L5-S1. IMPRESSION: No acute fracture or dislocation. Electronically Signed   By: Keane Police D.O.   On: 07/12/2022 11:19   DG Ribs Unilateral W/Chest Right  Result Date: 07/12/2022 CLINICAL DATA:  Fall, pain EXAM: RIGHT RIBS AND CHEST - 3+ VIEW COMPARISON:  Chest radiograph dated May 12, 2021 FINDINGS: No fracture or other bone lesions are seen involving the ribs. There is no evidence of pneumothorax or pleural effusion. Left basilar atelectasis. Heart size and mediastinal contours are within normal limits. Retrocardiac opacity, likely secondary to hiatal hernia, unchanged. Sternotomy wires are intact. IMPRESSION: 1. No evidence of displaced rib fracture or pneumothorax. 2. Left basilar atelectasis, likely secondary to chronic moderate size hiatal hernia. Electronically Signed   By: Keane Police D.O.   On: 07/12/2022 11:17   DG Lumbar Spine Complete  Result Date: 07/12/2022 CLINICAL DATA:  Fall, pain EXAM: LUMBAR SPINE - COMPLETE 4+ VIEW COMPARISON:  None Available. FINDINGS: Compression deformity of L1 vertebral body of indeterminate age. Straightening of the lumbar spine. Grade 1 anterolisthesis of L5-S1. Multilevel degenerate disc disease with disc height loss and marginal osteophytes. Facet joint arthropathy prominent at L5-S1. IMPRESSION: 1. Compression deformity of L1 vertebral body of indeterminate age. Correlate with point tenderness. 2. Multilevel degenerative disc disease and facet joint arthropathy. Electronically Signed   By:  Keane Police D.O.   On: 07/12/2022 11:13    Procedures Procedures    Medications Ordered in ED Medications  lidocaine (LIDODERM) 5 % 1 patch (1 patch Transdermal Patch Applied 07/12/22 1023)  gabapentin (NEURONTIN) capsule 800 mg (800 mg Oral Not Given 07/12/22 1412)  ondansetron (ZOFRAN) injection 4 mg (4 mg Intravenous Given 07/12/22 1300)  morphine (PF) 4 MG/ML injection 4 mg (4 mg Intravenous Given 07/12/22 1300)    ED Course/ Medical Decision Making/ A&P                           Medical Decision Making Amount and/or Complexity  of Data Reviewed Labs: ordered. Radiology: ordered.  Risk Prescription drug management.   This patient presents to the ED for concern of back pain after a fall 2 days ago, this involves an extensive number of treatment options, and is a complaint that carries with it a high risk of complications and morbidity.  The differential diagnosis includes but not limited to compression fracture, sprain, strain, fracture, contusion   Co morbidities that complicate the patient evaluation  Osteoporosis, Charcot-Marie-Tooth, fibromyalgia arthritis, depression   Additional history obtained:  External records from outside source obtained and reviewed including  Imaging and discharge summary from patient's 05/15/2021 admission for fall resulting in numerous left rib fractures   Lab Tests:  I Ordered, and personally interpreted labs.  The pertinent results include: BC and BMP within normal meds.   Imaging Studies ordered:  I ordered imaging studies including CT lumbar spine, x-ray lumbar spine, x-ray right hip, x-ray right ribs, T-spine x-ray I independently visualized and interpreted imaging which showed L1 compression fracture, age-indeterminate I agree with the radiologist interpretation  Consultations Obtained:  I requested consultation with the ER attending, Dr. Mayra Neer,  and discussed lab and imaging findings as well as pertinent plan - they recommend:  morphine, ambulate, CT L-spine, TLSO, admit if not able to control pain, dc to follow up if pain controlled   Problem List / ED Course / Critical interventions / Medication management  66 year old female presents for evaluation of back pain after fall which occurred 2 days ago, pain was so severe that she could not ambulate this morning and came by EMS.  Prior to arrival, patient had taken her regularly prescribed gabapentin, baclofen, tramadol.  While lying supine in the bed, her O2 sats dropped into the upper 80s requiring supplemental oxygen.  For this reason, narcotic pain medication was not given immediately upon arrival for her pain, she was treated instead with a Lidoderm patch.  Imaging is obtained revealing age-indeterminate L1 compression fracture.  Case was discussed with ER attending who recommends narcotic pain medication.  Patient is able to sustain O2 sats greater than 90% without supplemental O2, she is given morphine, maintains her O2 sats and was given her regularly scheduled gabapentin.  CT with question of acute versus chronic L1 compression fracture.  Patient is placed in a TLSO brace, provided with short course of Norco, advised not to take her tramadol while taking Norco.,  Referred to neurosurgery for follow-up. I ordered medication including Lidoderm, morphine for pain Reevaluation of the patient after these medicines showed that the patient improved I have reviewed the patients home medicines and have made adjustments as needed   Social Determinants of Health:  Has PCP   Test / Admission - Considered:  Admission is considered for pain control however after morphine, patient is ambulatory without assistance.  TLSO applied, referred to neurosurgery for follow-up.         Final Clinical Impression(s) / ED Diagnoses Final diagnoses:  Fall, initial encounter  Closed compression fracture of body of L1 vertebra [facility])    Rx / DC Orders ED Discharge Orders           Ordered    HYDROcodone-acetaminophen (NORCO/VICODIN) 5-325 MG tablet  Every 4 hours PRN        07/12/22 1714              Tacy Learn, PA-C 07/12/22 1744    Audley Hose, MD 07/13/22 2105

## 2022-07-12 NOTE — ED Notes (Signed)
Assisted pt to the bathroom with minimal assistance and she used her personal walker. Pt urinated and was walked back to her room. Pt was assisted in getting dressed. Staff member is in room to apply TLSO brace at this time.

## 2022-07-12 NOTE — ED Triage Notes (Signed)
Pt arrives to ED via [facility] with c/o fall. She notes lower back pain. The fall occurred x2 days ago.

## 2022-07-12 NOTE — Progress Notes (Signed)
Orthopedic Tech Progress Note Patient Details:  [name] [birth year] [id number]  Called in order to [facility] earlier for a TLSO BRACE   Patient ID: Alexandra Norman, female   DOB: 08-21-1956, 66 y.o.   MRN: [id number]  Janit Pagan 07/12/2022, 4:15 PM

## 2022-07-12 NOTE — ED Notes (Signed)
TLSO brace placed by Ortho

## 2022-07-12 NOTE — ED Notes (Signed)
Patient verbalizes understanding of discharge instructions. Opportunity for questioning and answers were provided. Patient discharged from ED.  °

## 2022-07-12 NOTE — ED Notes (Signed)
Noted Pt's Pulse Ox started dropping. Pt got to 88% on Room Air. Pt is laying flat but stated that her pain is better if she is laying more flat. Placed Pt on 2L O2.Pt currently 93% on 2L.

## 2022-07-12 NOTE — Discharge Instructions (Addendum)
Take Norco in place of your tramadol dose if needed for pain relief.  Taking both of these medications can result in respiratory depression requiring reversal agents or leading to life threatening events.  Follow-up with neurosurgery, call to schedule an appointment.  Wear your brace except to bathe.

## 2022-07-12 NOTE — ED Notes (Signed)
Pt took 800 Mg of her own gabapentin, provider informed

## 2022-07-12 NOTE — ED Notes (Signed)
Patient transported to CT 

## 2022-07-14 ENCOUNTER — Telehealth: Payer: Self-pay | Admitting: Family Medicine

## 2022-07-14 NOTE — Telephone Encounter (Signed)
Pt called to say she had a fall and broke her L-1 and compressed T-10.  Pt states pain level is at an 11.  Pt is requesting a referral to see the neurologist / neurosurgeon.  Please advise  [phone]

## 2022-07-14 NOTE — Telephone Encounter (Signed)
I spoke with the patient and advised her a ED follow up would be needed with PCP and a referral may be placed at that time. Patient stated she is having difficulty walking and declined follow up. Patient inquired if PCP can place referral without her being seen.

## 2022-07-15 NOTE — Telephone Encounter (Signed)
Pt does not need a referral she has an appt with neurosurgeon on 07-22-2022. Pt is calling and would like a refill on HYDROcodone-acetaminophen (NORCO/VICODIN) 5-325 MG tablet that ER prescribed for her  [id number] [address], [place] Phone:  [phone]  Fax:  [phone]

## 2022-07-16 MED ORDER — HYDROCODONE-ACETAMINOPHEN 5-325 MG PO TABS: 1.0000 | ORAL_TABLET | ORAL | 0 refills | Status: DC | PRN

## 2022-07-16 NOTE — Addendum Note (Signed)
Addended by: Eulas Post on: 07/16/2022 07:00 AM   Modules accepted: Orders

## 2022-07-16 NOTE — Telephone Encounter (Signed)
I sent in one refill until she can see neurosurgeon.  Eulas Post MD [facility]

## 2022-07-21 ENCOUNTER — Telehealth: Payer: Self-pay | Admitting: Family Medicine

## 2022-07-21 NOTE — Telephone Encounter (Signed)
I informed the patient and she would need to come into the office to have records released. Patient informed me she contacted her lawyer and they have provided these forms for her.

## 2022-07-21 NOTE — Telephone Encounter (Signed)
Pt needs a copy of her medical power of attorney   [facility] Fax:  [phone] Attn:  Thompson Caul

## 2022-08-09 ENCOUNTER — Telehealth: Payer: Medicare Other | Admitting: Family Medicine

## 2022-08-13 ENCOUNTER — Telehealth: Payer: Self-pay | Admitting: Family Medicine

## 2022-08-13 ENCOUNTER — Telehealth (INDEPENDENT_AMBULATORY_CARE_PROVIDER_SITE_OTHER): Payer: Medicare Other | Admitting: Family Medicine

## 2022-08-13 VITALS — HR 61

## 2022-08-13 DIAGNOSIS — S22080D Wedge compression fracture of T11-T12 vertebra, subsequent encounter for fracture with routine healing: Secondary | ICD-10-CM | POA: Diagnosis not present

## 2022-08-13 DIAGNOSIS — Z8781 Personal history of (healed) traumatic fracture: Secondary | ICD-10-CM | POA: Diagnosis not present

## 2022-08-13 DIAGNOSIS — I4891 Unspecified atrial fibrillation: Secondary | ICD-10-CM

## 2022-08-13 DIAGNOSIS — S22080A Wedge compression fracture of T11-T12 vertebra, initial encounter for closed fracture: Secondary | ICD-10-CM | POA: Insufficient documentation

## 2022-08-13 DIAGNOSIS — M810 Age-related osteoporosis without current pathological fracture: Secondary | ICD-10-CM

## 2022-08-13 MED ORDER — METOPROLOL SUCCINATE ER 25 MG PO TB24: 25.0000 mg | ORAL_TABLET | Freq: Every day | ORAL | 0 refills | Status: DC

## 2022-08-13 NOTE — Telephone Encounter (Signed)
Patient and Rosanne Ashing from [facility] informed of the message and verbalized understanding. Rx was sent. After speaking with Rosanne Ashing he request verbal orders for: PT 1 wk X 1, 2 wk X 4, 1 wk X 4 and Home health nurse to assist in bathing and dressing for 2 wk X 4. Rosanne Ashing stated the patient is receiving occupational therapy evaluation and medical social worker. Also after speaking with the patient she stated that while she was In the hospital the staff did not administer her medications in a timely manner. Patient stated she has not taken Gabapentin for approximately 1 week while she was hospitalized. Patient is now taking Gabapentin 400 mg 4 times daily every 6 hours instead of 800 mg. Patient inquired if she can stay at this current dosage for now?

## 2022-08-13 NOTE — Progress Notes (Signed)
Patient ID: Alexandra Norman, female   DOB: Dec 24, 1955, 66 y.o.   MRN: [id number]   Virtual Visit via Video Note  I connected withNAME@ on 08/13/22 at 11:30 AM EST by a video enabled telemedicine application and verified that I am speaking with the correct person using two identifiers.  Location patient: home Location provider:work or home office Persons participating in the virtual visit: patient, provider  I discussed the limitations of evaluation and management by telemedicine and the availability of in person appointments. The patient expressed understanding and agreed to proceed.   HPI:  [name] set up virtual to discuss several items.  She states that back on October night she fell and injured her back and apparently had T12 and L1 compression fractures.  Her back pain is already improving.  She does has history of osteoporosis and is on Prolia and due for repeat injection soon.  She was visiting a friend in [place] and fell fracturing her right hip October 17.  She went to a hospital in [facility] where she had hip surgery.  Following surgery she reportedly went into atrial fibrillation.  She was placed on amiodarone, metoprolol, and Eliquis.  She was told there may be a drug interaction with her Seroquel which she gets to psychiatry and amiodarone and she has not been taking amiodarone since discharge.  She does remain on metoprolol succinate 25 mg and Eliquis 5 mg twice daily.  She has a smart watch and states that her watch does not indicate atrial fibrillation at this time.  She does not have follow-up scheduled with cardiology.  We do not have any discharge records at this time but patient has signed release  Home PT starting today.  Past Medical History:  Diagnosis Date   ADD (attention deficit disorder)    Allergy    Arrhythmia    Arthritis    Charcot-Marie-Tooth disease    Depression    Fibromyalgia    Headache(784.0)    Heart murmur     Hyperlipidemia    Osteoarthritis    Osteoporosis    Rotator cuff tear    Thyroid disease    Ulcer    UTI (urinary tract infection)    Past Surgical History:  Procedure Laterality Date   CARDIAC SURGERY  1982   ASD repair    CARPAL TUNNEL RELEASE  2007   bilateral   CATARACT EXTRACTION, BILATERAL Bilateral    FOOT SURGERY  2001 and 2005   bilateral   HERNIA REPAIR     WRIST SURGERY Left 08/12/2017    reports that she has never smoked. She has never used smokeless tobacco. She reports that she does not drink alcohol and does not use drugs. family history includes Alcohol abuse in her father and mother; Arthritis in her father and mother; Breast cancer in her sister; Mental illness in her mother and sister; Thyroid disease in her mother. Allergies  Allergen Reactions   Penicillins     hives   Statins    Sulfa Antibiotics       ROS: See pertinent positives and negatives per HPI.  Past Medical History:  Diagnosis Date   ADD (attention deficit disorder)    Allergy    Arrhythmia    Arthritis    Charcot-Marie-Tooth disease    Depression    Fibromyalgia    Headache(784.0)    Heart murmur    Hyperlipidemia    Osteoarthritis    Osteoporosis    Rotator cuff tear  Thyroid disease    Ulcer    UTI (urinary tract infection)     Past Surgical History:  Procedure Laterality Date   CARDIAC SURGERY  1982   ASD repair    CARPAL TUNNEL RELEASE  2007   bilateral   CATARACT EXTRACTION, BILATERAL Bilateral    FOOT SURGERY  2001 and 2005   bilateral   HERNIA REPAIR     WRIST SURGERY Left 08/12/2017    Family History  Problem Relation Age of Onset   Alcohol abuse Mother    Arthritis Mother    Mental illness Mother        Suicide in 65   Thyroid disease Mother    Alcohol abuse Father    Arthritis Father    Mental illness Sister        Suicide in 68   Breast cancer Sister     SOCIAL HX: Lives alone.  Non-smoker.  No alcohol.   Current Outpatient  Medications:    amiodarone (PACERONE) 200 MG tablet, Take 200 mg by mouth daily., Disp: , Rfl:    Ascorbic Acid (VITAMIN C) 1000 MG tablet, Take 1,000 mg by mouth daily., Disp: , Rfl:    baclofen (LIORESAL) 10 MG tablet, Take 1 tablet (10 mg total) by mouth 3 (three) times daily., Disp: 270 each, Rfl: 3   buPROPion (WELLBUTRIN XL) 150 MG 24 hr tablet, Take 450 mg by mouth daily., Disp: , Rfl:    calcium carbonate (OSCAL) 1500 (600 Ca) MG TABS tablet, Take 1,500 mg by mouth daily with breakfast., Disp: , Rfl:    Coenzyme Q10 (CO Q10) 100 MG CAPS, Take 100 mg by mouth daily., Disp: , Rfl:    Cranberry 400 MG CAPS, Take 400 mg by mouth as needed., Disp: , Rfl:    denosumab (PROLIA) 60 MG/ML SOSY injection, Inject 60 mg into the skin every 6 (six) months., Disp: , Rfl:    Docusate Calcium (STOOL SOFTENER PO), Take by mouth. 3 capsules at bedtime., Disp: , Rfl:    esomeprazole (NEXIUM) 20 MG capsule, Take 20 mg by mouth daily at 12 noon., Disp: , Rfl:    fluticasone (FLONASE) 50 MCG/ACT nasal spray, SPRAY 2 SPRAYS INTO EACH NOSTRIL EVERY DAY (Patient taking differently: Place 2 sprays into both nostrils daily.), Disp: 16 g, Rfl: 2   gabapentin (NEURONTIN) 800 MG tablet, TAKE 1 TABLET BY MOUTH FOUR TIMES A DAY (Patient taking differently: 400 mg. Every 6 hours), Disp: 360 tablet, Rfl: 0   HYDROcodone-acetaminophen (NORCO/VICODIN) 5-325 MG tablet, Take 1 tablet by mouth every 4 (four) hours as needed., Disp: 20 tablet, Rfl: 0   lamoTRIgine (LAMICTAL) 150 MG tablet, Take 150 mg by mouth daily. , Disp: , Rfl:    loratadine (CLARITIN) 10 MG tablet, Take 10 mg by mouth daily., Disp: , Rfl:    metoprolol succinate (TOPROL-XL) 25 MG 24 hr tablet, Take 25 mg by mouth daily., Disp: , Rfl:    Multiple Vitamins-Minerals (MULTIVITAMIN & MINERAL PO), Take 1 tablet by mouth daily., Disp: , Rfl:    oxyCODONE-acetaminophen (PERCOCET/ROXICET) 5-325 MG tablet, Take 2 tablets by mouth every 6 (six) hours as needed., Disp:  , Rfl:    QUEtiapine (SEROQUEL) 200 MG tablet, Take 200 mg by mouth at bedtime., Disp: , Rfl:    valbenazine (INGREZZA) 80 MG capsule, Take 80 mg by mouth daily., Disp: , Rfl:    VITAMIN D, CHOLECALCIFEROL, PO, Take 5,000 Units by mouth daily., Disp: , Rfl:  Nutritional Supplements (ADULT GROWTH HORMONE SUPPORT PO), Take 1 capsule by mouth daily. (Patient not taking: Reported on 08/13/2022), Disp: , Rfl:    pantoprazole (PROTONIX) 40 MG tablet, Take 1 tablet (40 mg total) by mouth daily. (Patient not taking: Reported on 08/13/2022), Disp: 30 tablet, Rfl: 3   Probiotic Product (PROBIOTIC COLON SUPPORT) CAPS, Take 1 capsule by mouth daily. (Patient not taking: Reported on 08/13/2022), Disp: , Rfl:    propranolol (INDERAL) 10 MG tablet, Take 10 mg by mouth as needed. (Patient not taking: Reported on 08/13/2022), Disp: , Rfl:    traMADol (ULTRAM) 50 MG tablet, TAKE TWO TABLETS BY MOUTH EVERY 8 HOURS FOR CHRONIC PAIN. (Patient not taking: Reported on 08/13/2022), Disp: 180 tablet, Rfl: 2  EXAM:  VITALS per patient if applicable:  GENERAL: alert, oriented, appears well and in no acute distress  HEENT: atraumatic, conjunttiva clear, no obvious abnormalities on inspection of external nose and ears  NECK: normal movements of the head and neck  LUNGS: on inspection no signs of respiratory distress, breathing rate appears normal, no obvious gross SOB, gasping or wheezing  CV: no obvious cyanosis  MS: moves all visible extremities without noticeable abnormality  PSYCH/NEURO: pleasant and cooperative, no obvious depression or anxiety, speech and thought processing grossly intact  ASSESSMENT AND PLAN:  Discussed the following assessment and plan:  Atrial fibrillation, unspecified type ([place]) - Plan: Ambulatory referral to Cardiology  S/P right hip fracture  Osteoporosis, unspecified osteoporosis type, unspecified pathological fracture presence  -Patient had atrial fibrillation postsurgery  for hip fracture.  She has not been taking amiodarone since discharge.  There is caution of QT prolongation in combination with Seroquel.  She is on metoprolol succinate 25 mg daily and Eliquis.  According to her smart watch is not in atrial fibrillation at this time.  Heart rate stable.  Asymptomatic. -Set up follow-up with cardiology for further assessment.  She is holding amiodarone until follow-up with them.  Stressed importance of remaining on Eliquis and Toprol-XL  -Continue home PT  -She is due for repeat Prolia injection.  We will check in to see if it is possible that she can get this through home health nursing.  If not suggest that she delay her injection for a month or 2 if necessary   I discussed the assessment and treatment plan with the patient. The patient was provided an opportunity to ask questions and all were answered. The patient agreed with the plan and demonstrated an understanding of the instructions.   The patient was advised to call back or seek an in-person evaluation if the symptoms worsen or if the condition fails to improve as anticipated.     Carolann Littler, MD

## 2022-08-13 NOTE — Telephone Encounter (Signed)
Alexandra Norman from [facility] call and stated pt was in the hospital went to see pt today pt bp was 160/80.pt report have constipation and hemorrhoid with bleeding also stated they can do the prolia injection, Alexandra Norman stated pt lost 10 pounds when she was in the hospital also live along in a two story apartment building her main caregiver live in [place] and only stay with her for two days also have stress between then that is real high  and her medication Metoprolol is missing from the home.

## 2022-08-14 ENCOUNTER — Other Ambulatory Visit: Payer: Self-pay | Admitting: Family Medicine

## 2022-08-18 NOTE — Telephone Encounter (Signed)
Spoke with the patient and informed her of the message below regarding Gabapentin.  Also spoke with Rosanne Ashing and informed him of the approval for orders as below.  Rosanne Ashing stated he also had a number of other questions and wanted to know when the patient should be wearing the back brace, patient stated she has lost weight, BP was elevated with reading of 160/80 on 11/10 and the patient could not locate the Metoprolol and was not aware of what medications she should/should not be taking.  Message sent to PCP.

## 2022-08-19 NOTE — Telephone Encounter (Signed)
Left a message for Jim to return my call. 

## 2022-08-19 NOTE — Telephone Encounter (Signed)
Rosanne Ashing called back and was informed of the message below.  Rosanne Ashing stated he feels the patient would benefit from an in-office visit.  I called the patient and offered an appt sooner than the scheduled appt on 11/17/22.  Patient stated she is unable to go up and down stairs due to shoulder, other problems and will have someone to help her in December.  Offered to schedule an appt and the patient stated she would prefer to call back when she is able to schedule an appt.  Message sent to PCP.

## 2022-08-20 NOTE — Telephone Encounter (Signed)
Noted  

## 2022-08-22 ENCOUNTER — Encounter: Payer: Self-pay | Admitting: Family Medicine

## 2022-08-24 ENCOUNTER — Other Ambulatory Visit: Payer: Self-pay | Admitting: Neurological Surgery

## 2022-08-24 ENCOUNTER — Encounter: Payer: Self-pay | Admitting: Family Medicine

## 2022-08-24 DIAGNOSIS — S22080D Wedge compression fracture of T11-T12 vertebra, subsequent encounter for fracture with routine healing: Secondary | ICD-10-CM

## 2022-08-24 DIAGNOSIS — M4854XD Collapsed vertebra, not elsewhere classified, thoracic region, subsequent encounter for fracture with routine healing: Secondary | ICD-10-CM

## 2022-08-24 DIAGNOSIS — M4856XD Collapsed vertebra, not elsewhere classified, lumbar region, subsequent encounter for fracture with routine healing: Secondary | ICD-10-CM

## 2022-08-24 MED ORDER — GABAPENTIN 400 MG PO CAPS: ORAL_CAPSULE | ORAL | 3 refills | Status: DC

## 2022-09-09 ENCOUNTER — Encounter: Payer: Self-pay | Admitting: Family Medicine

## 2022-09-10 ENCOUNTER — Ambulatory Visit
Admission: RE | Admit: 2022-09-10 | Discharge: 2022-09-10 | Disposition: A | Discharge status: Home / Self Care | Payer: Medicare Other | Source: Ambulatory Visit | Attending: Neurological Surgery | Admitting: Neurological Surgery

## 2022-09-10 DIAGNOSIS — M4856XD Collapsed vertebra, not elsewhere classified, lumbar region, subsequent encounter for fracture with routine healing: Secondary | ICD-10-CM

## 2022-09-10 DIAGNOSIS — M4854XD Collapsed vertebra, not elsewhere classified, thoracic region, subsequent encounter for fracture with routine healing: Secondary | ICD-10-CM

## 2022-09-10 MED ORDER — APIXABAN 5 MG PO TABS: 5.0000 mg | ORAL_TABLET | Freq: Two times a day (BID) | ORAL | 0 refills | Status: DC

## 2022-09-24 ENCOUNTER — Other Ambulatory Visit: Payer: Self-pay | Admitting: Family Medicine

## 2022-09-29 NOTE — Telephone Encounter (Signed)
Spoke with the patient and she stated she has not taken Oxycodone since Thanksgiving  due to constipation and this did not provide pain relief.  Message sent to PCP.

## 2022-10-15 ENCOUNTER — Telehealth (INDEPENDENT_AMBULATORY_CARE_PROVIDER_SITE_OTHER): Payer: Medicare Other | Admitting: Family Medicine

## 2022-10-15 ENCOUNTER — Encounter: Payer: Self-pay | Admitting: Family Medicine

## 2022-10-15 VITALS — Ht 63.0 in | Wt 175.0 lb

## 2022-10-15 DIAGNOSIS — R42 Dizziness and giddiness: Secondary | ICD-10-CM

## 2022-10-15 DIAGNOSIS — R5383 Other fatigue: Secondary | ICD-10-CM

## 2022-10-15 DIAGNOSIS — I4891 Unspecified atrial fibrillation: Secondary | ICD-10-CM | POA: Diagnosis not present

## 2022-10-15 NOTE — Progress Notes (Signed)
Patient ID: Alexandra Norman, female   DOB: [birth year], 67 y.o.   MRN: [id number]  Virtual Visit via Video Note  I connected with Alexandra Norman on 10/15/22 at 10:00 AM EST by a video enabled telemedicine application and verified that I am speaking with the correct person using two identifiers.  Location patient: home Location provider:work or home office Persons participating in the virtual visit: patient, provider  I discussed the limitations of evaluation and management by telemedicine and the availability of in person appointments. The patient expressed understanding and agreed to proceed.   HPI:  Alexandra Norman is seen with 35-month history of dizziness and lightheadedness.  Her recent history is that back in October she fell and had T 12 and L1 compression fractures.  She had also fallen and fractured her right hip October 12.  She was admitted to hospital in [facility] and during surgery went into A-fib.  Discharged on metoprolol, amiodarone, and Eliquis.  She has had tremendous difficulties with transportation and basically has no support system here and is in process of moving to [place] a week from Friday.  Her issue now is that she is complaining of some dizziness which is described as lightheadedness for past couple months.  She had profound weakness which seems to come and go can use last 2 to 3 days.  She has some baseline fatigue and weakness but seems much worse since starting the amiodarone.  No headaches.  Some increased tremor.  Difficulty concentrating.  States 1 episode of nausea without vomiting.  No clear triggers.  She has a smart watch and states her pulse has been around 70 and no evidence for atrial fibrillation.  She had thyroid functions in August which were normal.  She has obstructive sleep apnea and is on CPAP.  Denies any vertigo symptoms.  No chest pains.  No dysuria.  Past Medical History:  Diagnosis Date   ADD (attention deficit disorder)    Allergy     Arrhythmia    Arthritis    Charcot-Marie-Tooth disease    Depression    Fibromyalgia    Headache(784.0)    Heart murmur    Hyperlipidemia    Osteoarthritis    Osteoporosis    Rotator cuff tear    Thyroid disease    Ulcer    UTI (urinary tract infection)    Past Surgical History:  Procedure Laterality Date   CARDIAC SURGERY  1982   ASD repair    CARPAL TUNNEL RELEASE  2007   bilateral   CATARACT EXTRACTION, BILATERAL Bilateral    FOOT SURGERY  2001 and 2005   bilateral   HERNIA REPAIR     WRIST SURGERY Left 08/12/2017    reports that she has never smoked. She has never used smokeless tobacco. She reports that she does not drink alcohol and does not use drugs. family history includes Alcohol abuse in her father and mother; Arthritis in her father and mother; Breast cancer in her sister; Mental illness in her mother and sister; Thyroid disease in her mother. Allergies  Allergen Reactions   Penicillins     hives   Statins    Sulfa Antibiotics       ROS: See pertinent positives and negatives per HPI.  Past Medical History:  Diagnosis Date   ADD (attention deficit disorder)    Allergy    Arrhythmia    Arthritis    Charcot-Marie-Tooth disease    Depression    Fibromyalgia    Headache(784.0)  Heart murmur    Hyperlipidemia    Osteoarthritis    Osteoporosis    Rotator cuff tear    Thyroid disease    Ulcer    UTI (urinary tract infection)     Past Surgical History:  Procedure Laterality Date   CARDIAC SURGERY  1982   ASD repair    CARPAL TUNNEL RELEASE  2007   bilateral   CATARACT EXTRACTION, BILATERAL Bilateral    FOOT SURGERY  2001 and 2005   bilateral   HERNIA REPAIR     WRIST SURGERY Left 08/12/2017    Family History  Problem Relation Age of Onset   Alcohol abuse Mother    Arthritis Mother    Mental illness Mother        Suicide in 41   Thyroid disease Mother    Alcohol abuse Father    Arthritis Father    Mental illness Sister         Suicide in 90   Breast cancer Sister     SOCIAL HX: Non-smoker.  Lives alone.  Very little support locally.  Is not driving at this time.  Has transportation issues.  Planning moved to [facility] as above in 1 week to be closer to friends who can help her   Current Outpatient Medications:    amiodarone (PACERONE) 200 MG tablet, Take 200 mg by mouth daily., Disp: , Rfl:    apixaban (ELIQUIS) 5 MG TABS tablet, Take 1 tablet (5 mg total) by mouth 2 (two) times daily., Disp: 180 tablet, Rfl: 0   Ascorbic Acid (VITAMIN C) 1000 MG tablet, Take 1,000 mg by mouth daily., Disp: , Rfl:    baclofen (LIORESAL) 10 MG tablet, Take 1 tablet (10 mg total) by mouth 3 (three) times daily., Disp: 270 each, Rfl: 3   buPROPion (WELLBUTRIN XL) 150 MG 24 hr tablet, Take 450 mg by mouth daily., Disp: , Rfl:    calcium carbonate (OSCAL) 1500 (600 Ca) MG TABS tablet, Take 1,500 mg by mouth daily with breakfast., Disp: , Rfl:    Coenzyme Q10 (CO Q10) 100 MG CAPS, Take 100 mg by mouth daily., Disp: , Rfl:    Cranberry 400 MG CAPS, Take 400 mg by mouth as needed., Disp: , Rfl:    denosumab (PROLIA) 60 MG/ML SOSY injection, Inject 60 mg into the skin every 6 (six) months., Disp: , Rfl:    Docusate Calcium (STOOL SOFTENER PO), Take by mouth. 3 capsules at bedtime., Disp: , Rfl:    fluticasone (FLONASE) 50 MCG/ACT nasal spray, SPRAY 2 SPRAYS INTO EACH NOSTRIL EVERY DAY (Patient taking differently: Place 2 sprays into both nostrils daily.), Disp: 16 g, Rfl: 2   gabapentin (NEURONTIN) 400 MG capsule, Take 1 by mouth every 6 hours, Disp: 360 capsule, Rfl: 3   lamoTRIgine (LAMICTAL) 150 MG tablet, Take 150 mg by mouth daily. , Disp: , Rfl:    loratadine (CLARITIN) 10 MG tablet, Take 10 mg by mouth daily., Disp: , Rfl:    metoprolol succinate (TOPROL-XL) 25 MG 24 hr tablet, Take 1 tablet (25 mg total) by mouth daily., Disp: 90 tablet, Rfl: 0   Multiple Vitamins-Minerals (MULTIVITAMIN & MINERAL PO), Take 1 tablet by mouth daily.,  Disp: , Rfl:    Nutritional Supplements (ADULT GROWTH HORMONE SUPPORT PO), Take 1 capsule by mouth daily., Disp: , Rfl:    pantoprazole (PROTONIX) 40 MG tablet, TAKE 1 TABLET BY MOUTH EVERY DAY, Disp: 90 tablet, Rfl: 1   Probiotic Product (PROBIOTIC COLON  SUPPORT) CAPS, Take 1 capsule by mouth daily., Disp: , Rfl:    propranolol (INDERAL) 10 MG tablet, Take 10 mg by mouth as needed., Disp: , Rfl:    QUEtiapine (SEROQUEL) 200 MG tablet, Take 200 mg by mouth at bedtime., Disp: , Rfl:    traMADol (ULTRAM) 50 MG tablet, TAKE TWO TABLETS BY MOUTH EVERY 8 HOURS FOR CHRONIC PAIN., Disp: 180 tablet, Rfl: 2   valbenazine (INGREZZA) 80 MG capsule, Take 80 mg by mouth daily., Disp: , Rfl:    VITAMIN D, CHOLECALCIFEROL, PO, Take 5,000 Units by mouth daily., Disp: , Rfl:   EXAM:  VITALS per patient if applicable:  GENERAL: alert, oriented, appears well and in no acute distress  HEENT: atraumatic, conjunttiva clear, no obvious abnormalities on inspection of external nose and ears  NECK: normal movements of the head and neck  LUNGS: on inspection no signs of respiratory distress, breathing rate appears normal, no obvious gross SOB, gasping or wheezing  CV: no obvious cyanosis  MS: moves all visible extremities without noticeable abnormality  PSYCH/NEURO: pleasant and cooperative, no obvious depression or anxiety, speech and thought processing grossly intact  ASSESSMENT AND PLAN:  Discussed the following assessment and plan:  Alexandra Norman relates several month history of nonspecific symptoms of weakness, increased tremor, lightheadedness Recent atrial fibrillation during surgery for right hip and that was back in October.  She has not had any follow-up in office since then.  Her symptoms are very nonspecific and could be related to many things.  We explained that she really needs an office follow-up for EKG and labs and further assessment.  She is in process of moving to [place] as above.  Needs  cardiology follow-up as well.  She specifically inquired about stopping amiodarone.  We explained this would be best decided in conjunction with cardiology but given her difficulties with transportation we decided to go and have her stop the amiodarone until further evaluation.  She remains on Eliquis and also low-dose beta-blocker  It is certainly also possible she is having some adverse issues with metoprolol but more likely amiodarone.  She is aware that amiodarone has a long half-life and may take some time to see improvement if her symptoms are related to that Continue to monitor for any A-fib recurrence.  She does remain on Eliquis.  Her heart rate has been stable and she is seeing no evidence for atrial fibrillation whatsoever since her surgery.     I discussed the assessment and treatment plan with the patient. The patient was provided an opportunity to ask questions and all were answered. The patient agreed with the plan and demonstrated an understanding of the instructions.   The patient was advised to call back or seek an in-person evaluation if the symptoms worsen or if the condition fails to improve as anticipated.     Carolann Littler, MD

## 2022-10-20 ENCOUNTER — Emergency Department (HOSPITAL_BASED_OUTPATIENT_CLINIC_OR_DEPARTMENT_OTHER)
Admission: EM | Admit: 2022-10-20 | Discharge: 2022-10-20 | Disposition: A | Discharge status: Home / Self Care | Payer: Medicare Other | Attending: Emergency Medicine | Admitting: Emergency Medicine

## 2022-10-20 ENCOUNTER — Emergency Department (HOSPITAL_BASED_OUTPATIENT_CLINIC_OR_DEPARTMENT_OTHER): Payer: Medicare Other

## 2022-10-20 ENCOUNTER — Other Ambulatory Visit: Payer: Self-pay

## 2022-10-20 ENCOUNTER — Encounter (HOSPITAL_BASED_OUTPATIENT_CLINIC_OR_DEPARTMENT_OTHER): Payer: Self-pay | Admitting: Emergency Medicine

## 2022-10-20 DIAGNOSIS — W19XXXA Unspecified fall, initial encounter: Secondary | ICD-10-CM | POA: Insufficient documentation

## 2022-10-20 DIAGNOSIS — S9032XA Contusion of left foot, initial encounter: Secondary | ICD-10-CM | POA: Diagnosis not present

## 2022-10-20 DIAGNOSIS — Y92009 Unspecified place in unspecified non-institutional (private) residence as the place of occurrence of the external cause: Secondary | ICD-10-CM | POA: Diagnosis not present

## 2022-10-20 DIAGNOSIS — S6992XA Unspecified injury of left wrist, hand and finger(s), initial encounter: Secondary | ICD-10-CM | POA: Diagnosis present

## 2022-10-20 DIAGNOSIS — S60222A Contusion of left hand, initial encounter: Secondary | ICD-10-CM | POA: Insufficient documentation

## 2022-10-20 MED ORDER — ACETAMINOPHEN 500 MG PO TABS
1000.0000 mg | ORAL_TABLET | Freq: Once | ORAL | Status: AC
  Administered 2022-10-20: 1000 mg via ORAL
  Filled 2022-10-20: qty 2

## 2022-10-20 NOTE — ED Notes (Signed)
Pt with bruising to her left foot as well as her left hand from fall today. Pulses palpable and continues to have good sensation.

## 2022-10-20 NOTE — ED Provider Notes (Signed)
Emergency Department Provider Note   I have reviewed the triage vital signs and the nursing notes.   HISTORY  Chief Complaint Fall   HPI Kayliegh Boyers is a 67 y.o. female with past history reviewed below presents emergency department for evaluation after mechanical fall.  She has history of multiple falls and frequent orthopedic surgeries.  She uses a walker at home, basically at all times.  She fell this morning causing pain and bruising to the left hand and wrist.  She stumbled falling backward landing on her buttocks and bracing herself with her left hand and wrist.  No pain in the elbow or shoulder.  No head injury or loss of consciousness.  No presyncope symptoms prior to falling.  No severe back pain, numbness, weakness.  She has some associated pain and swelling in her left foot from a fall 2 days ago.  She lives at home alone and she is anticoagulated.  She states that she is moving to the [place] area to be closer to family, where they can provide additional assistance this weekend.    Past Medical History:  Diagnosis Date   ADD (attention deficit disorder)    Allergy    Arrhythmia    Arthritis    Charcot-Marie-Tooth disease    Depression    Fibromyalgia    Headache(784.0)    Heart murmur    Hyperlipidemia    Osteoarthritis    Osteoporosis    Rotator cuff tear    Thyroid disease    Ulcer    UTI (urinary tract infection)     Review of Systems  Constitutional: No fever/chills Cardiovascular: Denies chest pain. Respiratory: Denies shortness of breath. Gastrointestinal: No abdominal pain.  Musculoskeletal: Positive left hand and foot pain.  Skin: Negative for rash. Neurological: Negative for headaches, focal weakness or numbness.   ____________________________________________   PHYSICAL EXAM:  VITAL SIGNS: ED Triage Vitals  Enc Vitals Group     BP 10/20/22 1659 (!) 166/67     Pulse Rate 10/20/22 1659 62     Resp 10/20/22 1659 16     Temp 10/20/22  1659 98.5 F (36.9 C)     Temp Source 10/20/22 1659 Oral     SpO2 10/20/22 1659 93 %    Constitutional: Alert and oriented. Well appearing and in no acute distress. Eyes: Conjunctivae are normal.  Head: Atraumatic. Nose: No congestion/rhinnorhea. Mouth/Throat: Mucous membranes are moist.   Neck: No stridor.  No cervical spine tenderness to palpation. Cardiovascular: Normal rate, regular rhythm. Good peripheral circulation. Grossly normal heart sounds.   Respiratory: Normal respiratory effort.  No retractions. Lungs CTAB. Gastrointestinal: Soft and nontender. No distention.  Musculoskeletal: Mild swelling and ecchymosis to the left hand overlying the dorsum of the hand primarily over the second metacarpal and thumb.  No tense compartments.  Good capillary refill.  Strong radial pulse.  No tenderness to the elbow or shoulder. No scaphoid tenderness.  The left foot appears swollen over the dorsal aspect of the foot, mainly laterally.  No ankle or proximal fibular tenderness.  Neurologic:  Normal speech and language. No gross focal neurologic deficits are appreciated.  Skin:  Skin is warm, dry and intact. No rash noted.  ____________________________________________  RADIOLOGY  DG Wrist Complete Left  Result Date: 10/20/2022 CLINICAL DATA:  Fall, hand pain EXAM: LEFT WRIST - COMPLETE 3+ VIEW COMPARISON:  04/12/2016 FINDINGS: Chronic deformity of the distal radius with chronic nonunited proximal pole scaphoid fracture as well as a partially united distal  ulnar diaphyseal fracture traversed by plate and screw fixator. These deformities appear old and I do not observe a definite acute fracture. There is some lucency around the distal screws of the ulnar plate fixator which could indicate chronic loosening. IMPRESSION: 1. Chronic deformity of the distal radius and ulna, without definite acute fracture. 2. Lucency around the distal screws of the ulnar plate fixator which could indicate chronic  loosening. 3. 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 4. If pain persists despite conservative therapy, CT may be warranted for further characterization. Electronically Signed   By: Gaylyn Rong M.D.   On: 10/20/2022 18:30   DG Hand Complete Left  Result Date: 10/20/2022 CLINICAL DATA:  Fall this morning, pain in the hand. EXAM: LEFT HAND - COMPLETE 3+ VIEW COMPARISON:  04/12/2016 and CT scan from 07/01/2017 FINDINGS: Substantial loss of articular space in the interphalangeal joints, most severely in the middle finger proximal interphalangeal joint where there is notable spurring and potentially some chronic deformity of the distal head of the proximal phalanx. A component of erosive osteoarthritis cannot be excluded. Bony demineralization noted. 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 Old deformity from made prior distal radial fracture with some shortening of the metaphysis and spurring of the radial styloid. There is an ulnar plate and screw fixator place, with some lucency along the distal to fixation screws which could be an indicator of loosening. I do not perceive an acute fracture. IMPRESSION: 1. Substantial loss of articular space in the interphalangeal joints, most severely in the middle finger proximal interphalangeal joint. A component of erosive osteoarthritis cannot be excluded. 2. 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 3. Old healed distal radial fracture with some shortening of the metaphysis and spurring of the radial styloid. 4. Ulnar plate and screw fixator place, with some lucency along the distal to the screws which could be an indicator of loosening. Electronically Signed   By: Gaylyn Rong M.D.   On: 10/20/2022 18:28   DG Foot Complete Left  Result Date: 10/20/2022 CLINICAL DATA:  Patient fell this morning. Bruising to the left foot. EXAM: LEFT FOOT - COMPLETE 3+ VIEW COMPARISON:   Left ankle 03/21/2020 FINDINGS: Degenerative changes in the interphalangeal and intertarsal joints. Old screw fixation of the calcaneus. No evidence of acute fracture or dislocation. No focal bone lesions. Soft tissues are unremarkable. IMPRESSION: No acute bony abnormalities. Postoperative changes in the calcaneus. Electronically Signed   By: Burman Nieves M.D.   On: 10/20/2022 18:24    ____________________________________________   PROCEDURES  Procedure(s) performed:   Procedures  None ____________________________________________   INITIAL IMPRESSION / ASSESSMENT AND PLAN / ED COURSE  Pertinent labs & imaging results that were available during my care of the patient were reviewed by me and considered in my medical decision making (see chart for details).   This patient is Presenting for Evaluation of fall w/ hand and foot pain, which does require a range of treatment options, and is a complaint that involves a moderate risk of morbidity and mortality.  Radiologic Tests Ordered, included hand and foot x-ray. I independently interpreted the images and agree with radiology interpretation.    Medical Decision Making: Summary:  Patient presents emergency department for evaluation of pain after mechanical fall.  Compartments are soft and extremities are well-perfused.  The fall occurred this morning and there is fairly mild ecchymosis and swelling of the hand.  Imaging obtained.  Patient  did not strike her head or lose consciousness.  No syncope symptoms related to the event.  Reevaluation with update and discussion with patient.  Discussed the x-ray imaging findings including the lucency on the wrist hardware.  Patient reports she is aware of this and following with her orthopedic surgeon.  No indication for emergent Ortho evaluation or admit.  Patient will continue to monitor symptoms and follow with her outpatient team.    Patient's presentation is most consistent with acute,  uncomplicated illness.   Disposition: discharge  ____________________________________________  FINAL CLINICAL IMPRESSION(S) / ED DIAGNOSES  Final diagnoses:  Contusion of left hand, initial encounter  Contusion of left foot, initial encounter  Fall, initial encounter    Note:  This document was prepared using Dragon voice recognition software and may include unintentional dictation errors.  Nanda Quinton, MD, [facility] Emergency [facility]    Deette Revak, [facility], MD 10/20/22 [id number]

## 2022-10-20 NOTE — Discharge Instructions (Signed)
You may continue your home medications for pain as well as Tylenol or other topical medicines.  If you develop sudden severe or worsening pain, numbness/weakness, other sudden/severe symptoms please return to the emergency department for reevaluation.

## 2022-10-20 NOTE — ED Notes (Addendum)
Called [facility]; patient ready for discharge. [DISREGARD THIS NOTE; ERROR]

## 2022-10-20 NOTE — ED Triage Notes (Signed)
Fall this morning. Pain in left hand and bruising to left foot. On eliquis. Lost balance. Denies hitting head, no loc   Ems vs 158/82 74 98%

## 2022-10-30 ENCOUNTER — Other Ambulatory Visit: Payer: Self-pay | Admitting: Family Medicine

## 2022-11-01 ENCOUNTER — Ambulatory Visit: Payer: Medicare Other | Admitting: Internal Medicine

## 2022-11-03 ENCOUNTER — Telehealth: Payer: Self-pay | Admitting: Family Medicine

## 2022-11-03 NOTE — Telephone Encounter (Signed)
[place] [phone] - Direct line   Wanting to confirm receipt of a fax sent last week Re: Autoimmune Test

## 2022-11-04 NOTE — Telephone Encounter (Signed)
Forms received

## 2022-11-15 ENCOUNTER — Telehealth: Payer: Self-pay

## 2022-11-15 NOTE — Progress Notes (Signed)
[facility] Pharmacy Team  Reason for Encounter: General adherence update   Spoke with patient, she states she has moved to [place], [place] and is now established with a PCP in [place].    [facility] Pharmacist Assistant [phone]

## 2022-11-17 ENCOUNTER — Ambulatory Visit: Payer: Medicare Other | Admitting: Family Medicine

## 2022-12-05 ENCOUNTER — Other Ambulatory Visit: Payer: Self-pay | Admitting: Family Medicine

## 2023-01-26 ENCOUNTER — Other Ambulatory Visit: Payer: Self-pay | Admitting: Family Medicine

## 2023-01-29 ENCOUNTER — Other Ambulatory Visit: Payer: Self-pay | Admitting: Family Medicine

## 2023-03-01 ENCOUNTER — Other Ambulatory Visit: Payer: Self-pay | Admitting: Family Medicine

## 2023-05-02 ENCOUNTER — Other Ambulatory Visit: Payer: Self-pay | Admitting: Family Medicine

## 2023-05-04 ENCOUNTER — Encounter (INDEPENDENT_AMBULATORY_CARE_PROVIDER_SITE_OTHER): Payer: Self-pay

## 2023-05-05 ENCOUNTER — Other Ambulatory Visit: Payer: Self-pay | Admitting: Oncology

## 2023-05-05 DIAGNOSIS — Z006 Encounter for examination for normal comparison and control in clinical research program: Secondary | ICD-10-CM

## 2023-05-27 ENCOUNTER — Encounter: Payer: Self-pay | Admitting: Family Medicine

## 2023-05-28 ENCOUNTER — Other Ambulatory Visit: Payer: Self-pay | Admitting: Family Medicine

## 2023-05-30 ENCOUNTER — Telehealth: Payer: Self-pay | Admitting: Family Medicine

## 2023-05-30 NOTE — Telephone Encounter (Signed)
Pt will not need an AWV through our providers.  Patient moved to [facility] in January and has a new provider. I have updated the patient's chart.

## 2023-06-02 ENCOUNTER — Telehealth: Payer: Medicare Other | Admitting: Family Medicine

## 2023-07-09 ENCOUNTER — Other Ambulatory Visit: Payer: Self-pay | Admitting: Internal Medicine

## 2023-07-11 NOTE — Telephone Encounter (Signed)
Is this your pt?

## 2023-07-30 ENCOUNTER — Other Ambulatory Visit: Payer: Self-pay | Admitting: Family Medicine

## 2023-08-30 ENCOUNTER — Other Ambulatory Visit: Payer: Self-pay | Admitting: Family Medicine

## 2023-10-22 ENCOUNTER — Other Ambulatory Visit: Payer: Self-pay | Admitting: Family Medicine

## 2024-01-06 ENCOUNTER — Other Ambulatory Visit: Payer: Self-pay | Admitting: Family Medicine

## 2024-08-02 ENCOUNTER — Other Ambulatory Visit: Payer: Self-pay | Admitting: Medical Genetics

## 2024-08-02 DIAGNOSIS — Z006 Encounter for examination for normal comparison and control in clinical research program: Secondary | ICD-10-CM
